# Patient Record
Sex: Female | Born: 1985
Health system: Southern US, Community
[De-identification: ages and names within clinical notes are randomized; demographics above are authoritative.]

## PROBLEM LIST (undated history)

## (undated) DIAGNOSIS — G43909 Migraine, unspecified, not intractable, without status migrainosus: Secondary | ICD-10-CM

## (undated) DIAGNOSIS — Z7189 Other specified counseling: Secondary | ICD-10-CM

## (undated) DIAGNOSIS — O24419 Gestational diabetes mellitus in pregnancy, unspecified control: Secondary | ICD-10-CM

## (undated) DIAGNOSIS — J45909 Unspecified asthma, uncomplicated: Secondary | ICD-10-CM

## (undated) DIAGNOSIS — Z8759 Personal history of other complications of pregnancy, childbirth and the puerperium: Secondary | ICD-10-CM

## (undated) DIAGNOSIS — O9902 Anemia complicating childbirth: Secondary | ICD-10-CM

## (undated) DIAGNOSIS — R51 Headache: Secondary | ICD-10-CM

## (undated) DIAGNOSIS — Z7185 Encounter for immunization safety counseling: Secondary | ICD-10-CM

## (undated) DIAGNOSIS — B009 Herpesviral infection, unspecified: Secondary | ICD-10-CM

## (undated) HISTORY — DX: Herpesviral infection, unspecified: B00.9

## (undated) HISTORY — PX: TYMPANOSTOMY TUBE PLACEMENT: SHX32

## (undated) HISTORY — DX: Migraine, unspecified, not intractable, without status migrainosus: G43.909

## (undated) HISTORY — DX: Other specified counseling: Z71.89

## (undated) HISTORY — DX: Personal history of other complications of pregnancy, childbirth and the puerperium: Z87.59

## (undated) HISTORY — DX: Headache: R51

## (undated) HISTORY — DX: Unspecified asthma, uncomplicated: J45.909

## (undated) HISTORY — DX: Gestational diabetes mellitus in pregnancy, unspecified control: O24.419

## (undated) HISTORY — DX: Anemia complicating childbirth: O99.02

## (undated) HISTORY — PX: MOUTH SURGERY: SHX715

## (undated) HISTORY — PX: WISDOM TOOTH EXTRACTION: SHX21

## (undated) HISTORY — DX: Encounter for immunization safety counseling: Z71.85

## (undated) HISTORY — PX: TYMPANOPLASTY: SHX33

---
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
## 2009-04-26 ENCOUNTER — Ambulatory Visit: Payer: Self-pay | Admitting: Women's Health

## 2009-04-26 ENCOUNTER — Other Ambulatory Visit: Admission: RE | Admit: 2009-04-26 | Discharge: 2009-04-26 | Payer: Self-pay | Admitting: Gynecology

## 2010-04-30 ENCOUNTER — Encounter (INDEPENDENT_AMBULATORY_CARE_PROVIDER_SITE_OTHER): Payer: 59 | Admitting: Women's Health

## 2010-04-30 ENCOUNTER — Other Ambulatory Visit: Payer: Self-pay | Admitting: Women's Health

## 2010-04-30 ENCOUNTER — Other Ambulatory Visit (HOSPITAL_COMMUNITY)
Admission: RE | Admit: 2010-04-30 | Discharge: 2010-04-30 | Disposition: A | Discharge status: Home / Self Care | Payer: 59 | Source: Ambulatory Visit | Attending: Gynecology | Admitting: Gynecology

## 2010-04-30 DIAGNOSIS — N949 Unspecified condition associated with female genital organs and menstrual cycle: Secondary | ICD-10-CM

## 2010-04-30 DIAGNOSIS — Z124 Encounter for screening for malignant neoplasm of cervix: Secondary | ICD-10-CM | POA: Insufficient documentation

## 2010-04-30 DIAGNOSIS — Z113 Encounter for screening for infections with a predominantly sexual mode of transmission: Secondary | ICD-10-CM

## 2010-04-30 DIAGNOSIS — N938 Other specified abnormal uterine and vaginal bleeding: Secondary | ICD-10-CM

## 2010-04-30 DIAGNOSIS — Z01419 Encounter for gynecological examination (general) (routine) without abnormal findings: Secondary | ICD-10-CM

## 2010-07-30 ENCOUNTER — Ambulatory Visit (INDEPENDENT_AMBULATORY_CARE_PROVIDER_SITE_OTHER): Payer: 59 | Admitting: Women's Health

## 2010-07-30 DIAGNOSIS — Z3049 Encounter for surveillance of other contraceptives: Secondary | ICD-10-CM

## 2010-07-30 DIAGNOSIS — Z7689 Persons encountering health services in other specified circumstances: Secondary | ICD-10-CM

## 2010-08-07 ENCOUNTER — Other Ambulatory Visit (INDEPENDENT_AMBULATORY_CARE_PROVIDER_SITE_OTHER): Payer: Self-pay | Admitting: Otolaryngology

## 2010-08-07 DIAGNOSIS — H7190 Unspecified cholesteatoma, unspecified ear: Secondary | ICD-10-CM

## 2010-08-11 ENCOUNTER — Other Ambulatory Visit: Payer: 59

## 2010-10-22 ENCOUNTER — Ambulatory Visit (INDEPENDENT_AMBULATORY_CARE_PROVIDER_SITE_OTHER): Payer: 59 | Admitting: Women's Health

## 2010-10-22 ENCOUNTER — Encounter: Payer: Self-pay | Admitting: Women's Health

## 2010-10-22 VITALS — BP 140/70

## 2010-10-22 DIAGNOSIS — N898 Other specified noninflammatory disorders of vagina: Secondary | ICD-10-CM

## 2010-10-22 DIAGNOSIS — R82998 Other abnormal findings in urine: Secondary | ICD-10-CM

## 2010-10-22 DIAGNOSIS — L293 Anogenital pruritus, unspecified: Secondary | ICD-10-CM

## 2010-10-22 DIAGNOSIS — B3731 Acute candidiasis of vulva and vagina: Secondary | ICD-10-CM

## 2010-10-22 DIAGNOSIS — B373 Candidiasis of vulva and vagina: Secondary | ICD-10-CM

## 2010-10-22 MED ORDER — FLUCONAZOLE 150 MG PO TABS: 150.0000 mg | ORAL_TABLET | Freq: Once | ORAL | Status: AC

## 2010-10-22 MED ORDER — NYSTATIN-TRIAMCINOLONE 100000-0.1 UNIT/GM-% EX OINT: TOPICAL_OINTMENT | Freq: Two times a day (BID) | CUTANEOUS | Status: DC

## 2010-10-22 NOTE — Progress Notes (Signed)
  Presents with a complaint of vaginal irritation with redness, and some urinary frequency. UA today does show 2-3 WBCs, +1 bacteria, will check a urine culture. She just got back from a beach vacation, she has not been sexually active in approximately one year. She's using nuva ring for cycle control with good relief.  External genitalia 6 really erythematous and introitus and extending outward. No blisters were noted. Speculum exam cervix is pink healthy, moderate amount of the white discharge, vaginal walls are also erythematous. Wet prep is positive for yeast will treat with nystatin ointment externally, leave  open to air as best she can, wear loose clothing.  Diflucan 150 by mouth x1 dose, instructed to  call if no relief.

## 2010-11-20 ENCOUNTER — Ambulatory Visit (INDEPENDENT_AMBULATORY_CARE_PROVIDER_SITE_OTHER): Payer: 59

## 2010-11-20 DIAGNOSIS — Z23 Encounter for immunization: Secondary | ICD-10-CM

## 2010-11-21 DIAGNOSIS — Z23 Encounter for immunization: Secondary | ICD-10-CM

## 2011-04-27 ENCOUNTER — Encounter: Payer: Self-pay | Admitting: Gynecology

## 2011-04-27 ENCOUNTER — Ambulatory Visit (INDEPENDENT_AMBULATORY_CARE_PROVIDER_SITE_OTHER): Payer: 59 | Admitting: Gynecology

## 2011-04-27 VITALS — BP 140/92

## 2011-04-27 DIAGNOSIS — L293 Anogenital pruritus, unspecified: Secondary | ICD-10-CM

## 2011-04-27 DIAGNOSIS — N9089 Other specified noninflammatory disorders of vulva and perineum: Secondary | ICD-10-CM

## 2011-04-27 DIAGNOSIS — N898 Other specified noninflammatory disorders of vagina: Secondary | ICD-10-CM

## 2011-04-27 LAB — WET PREP FOR TRICH, YEAST, CLUE: Yeast Wet Prep HPF POC: NONE SEEN

## 2011-04-27 MED ORDER — VALACYCLOVIR HCL 1 G PO TABS: 1000.0000 mg | ORAL_TABLET | Freq: Two times a day (BID) | ORAL | Status: DC

## 2011-04-27 MED ORDER — FLUCONAZOLE 150 MG PO TABS: 150.0000 mg | ORAL_TABLET | Freq: Once | ORAL | Status: AC

## 2011-04-27 MED ORDER — ACYCLOVIR 5 % EX CREA: 1.0000 "application " | TOPICAL_CREAM | CUTANEOUS | Status: DC

## 2011-04-27 NOTE — Progress Notes (Signed)
Patient is a 26 year old that presented to the office today complaining of irritation and rawness of her external genitalia. She stated that 2 weeks ago she was treated for an upper respiratory tract infection as well as otitis media. She was first placed on a Z-Pak and then was placed on Augmentin and shortly thereafter the above-mentioned symptoms began. She states that she has not been sexually active in over year. She denied any dysuria or frequency or discharge per se.  Exam: External genitalia to include clitoral hood, creases between labia majora and minora bilateral an area of the fourchette with vesicular-like lesions highly suspicious for herpes simplex virus. Perineum raw appearance Vagina: Thick white material from Monistat Cervix: No gross lesions Bimanual exam not done due to patient's tenderness and irritation of the external genitalia.  Patient is on during her menstrual control.  GC and Chlamydia culture and wet prep as well as HSV cultures obtained. Wet prep few bacteria but white cream was present making it and complete evaluation.  Assessment/plan: Suspicion for herpes simplex virus first outbreak. Patient states that she has had oral HSV in the past and feels she may have passed upon herself her genitalia. She states she has not been sexually active in over year. Will place mobile checks 1 g by mouth twice a day for 10 days along with acyclovir cream to apply 3-4 times a day for the next 7-10 days. A prescription for Diflucan 150 mg was provided as well. Will notify her with the results later in the week. Literature information on HSV was provided. She will return back to the office in 2 weeks which is due for her annual exam as well.

## 2011-04-30 ENCOUNTER — Encounter: Payer: Self-pay | Admitting: Gynecology

## 2011-05-06 ENCOUNTER — Encounter: Payer: 59 | Admitting: Gynecology

## 2011-05-13 ENCOUNTER — Encounter: Payer: Self-pay | Admitting: Gynecology

## 2011-05-13 ENCOUNTER — Ambulatory Visit (INDEPENDENT_AMBULATORY_CARE_PROVIDER_SITE_OTHER): Payer: 59 | Admitting: Gynecology

## 2011-05-13 VITALS — BP 128/84 | Ht 66.0 in | Wt 179.0 lb

## 2011-05-13 DIAGNOSIS — K5900 Constipation, unspecified: Secondary | ICD-10-CM

## 2011-05-13 DIAGNOSIS — Z01419 Encounter for gynecological examination (general) (routine) without abnormal findings: Secondary | ICD-10-CM

## 2011-05-13 DIAGNOSIS — N9089 Other specified noninflammatory disorders of vulva and perineum: Secondary | ICD-10-CM

## 2011-05-13 DIAGNOSIS — IMO0001 Reserved for inherently not codable concepts without codable children: Secondary | ICD-10-CM

## 2011-05-13 DIAGNOSIS — R634 Abnormal weight loss: Secondary | ICD-10-CM

## 2011-05-13 DIAGNOSIS — Z309 Encounter for contraceptive management, unspecified: Secondary | ICD-10-CM

## 2011-05-13 LAB — WET PREP FOR TRICH, YEAST, CLUE
Clue Cells Wet Prep HPF POC: NONE SEEN
Trich, Wet Prep: NONE SEEN
Yeast Wet Prep HPF POC: NONE SEEN

## 2011-05-13 MED ORDER — ETONOGESTREL-ETHINYL ESTRADIOL 0.12-0.015 MG/24HR VA RING: VAGINAL_RING | VAGINAL | Status: DC

## 2011-05-13 MED ORDER — NYSTATIN-TRIAMCINOLONE 100000-0.1 UNIT/GM-% EX CREA: TOPICAL_CREAM | Freq: Three times a day (TID) | CUTANEOUS | Status: DC

## 2011-05-13 MED ORDER — CLINDAMYCIN PHOSPHATE 2 % VA CREA: 1.0000 | TOPICAL_CREAM | Freq: Every day | VAGINAL | Status: AC

## 2011-05-13 NOTE — Patient Instructions (Signed)
Breast Self-Exam A self breast exam may help you find changes or problems while they are still small. Do a breast self-exam:  Every month.   One week after your period (menstrual period).   On the first day of each month if you do not have periods anymore.  Look for any:  Change in breast color, size, or shape.   Dimples in your breast.   Changes in your nipples or skin.   Dry skin on your breasts or nipples.   Watery or bloody discharge from your nipples.   Feel for:  Lumps.   Thick, hard places.   Any other changes.  HOME CARE There are 3 ways to do the breast self-exam: In front of a mirror.  Lift your arms over your head and turn side to side.   Put your hands on your hips and lean down, then turn from side to side.   Bend forward and turn from side to side.  In the shower.  With soapy hands, check both breasts. Then check above and below your collarbone and your armpits.   Feel above and below your collarbone down to under your breast, and from the center of your chest to the outer edge of the armpit. Check for any lumps or hard spots.   Using the tips of your middle three fingers check your whole breast by pressing your hand over your breast in a circle or in an up and down motion.  Lying down.  Lie flat on your bed.   Put a small pillow under the breast you are going to check. On that same side, put your hand behind your head.   With your other hand, use the 3 middle fingers to feel the breast.   Move your fingers in a circle around the breast. Press firmly over all parts of the breast to feel for any lumps.  GET HELP RIGHT AWAY IF: You find any changes in your breasts so they can be checked. Document Released: 08/05/2007 Document Revised: 02/05/2011 Document Reviewed: 06/06/2008 [facility] Patient Information 2012 [place], [place].  Constipation in Adults Constipation is having fewer than 2 bowel movements per week. Usually, the stools are hard. As we  grow older, constipation is more common. If you try to fix constipation with laxatives, the problem may get worse. This is because laxatives taken over a long period of time make the colon muscles weaker. A low-fiber diet, not taking in enough fluids, and taking some medicines may make these problems worse. MEDICATIONS THAT MAY CAUSE CONSTIPATION  Water pills (diuretics).   Calcium channel blockers (used to control blood pressure and for the heart).   Certain pain medicines (narcotics).   Anticholinergics.   Anti-inflammatory agents.   Antacids that contain aluminum.  DISEASES THAT CONTRIBUTE TO CONSTIPATION  Diabetes.   Parkinson's disease.   Dementia.   Stroke.   Depression.   Illnesses that cause problems with salt and water metabolism.  HOME CARE INSTRUCTIONS   Constipation is usually best cared for without medicines. Increasing dietary fiber and eating more fruits and vegetables is the best way to manage constipation.   Slowly increase fiber intake to 25 to 38 grams per day. Whole grains, fruits, vegetables, and legumes are good sources of fiber. A dietitian can further help you incorporate high-fiber foods into your diet.   Drink enough water and fluids to keep your urine clear or pale yellow.   A fiber supplement may be added to your diet if you cannot  get enough fiber from foods.   Increasing your activities also helps improve regularity.   Suppositories, as suggested by your caregiver, will also help. If you are using antacids, such as aluminum or calcium containing products, it will be helpful to switch to products containing magnesium if your caregiver says it is okay.   If you have been given a liquid injection (enema) today, this is only a temporary measure. It should not be relied on for treatment of longstanding (chronic) constipation.   Stronger measures, such as magnesium sulfate, should be avoided if possible. This may cause uncontrollable diarrhea. Using  magnesium sulfate may not allow you time to make it to the bathroom.  SEEK IMMEDIATE MEDICAL CARE IF:   There is bright red blood in the stool.   The constipation stays for more than 4 days.   There is belly (abdominal) or rectal pain.   You do not seem to be getting better.   You have any questions or concerns.  MAKE SURE YOU:   Understand these instructions.   Will watch your condition.   Will get help right away if you are not doing well or get worse.  Document Released: 11/15/2003 Document Revised: 02/05/2011 Document Reviewed: 01/20/2011 [facility] Patient Information 2012 [place], [place].

## 2011-05-13 NOTE — Progress Notes (Addendum)
Laurie Reese 12-29-1985 [id number]   History:    26 y.o.  for annual exam who was recently treated for HSV confirmed by culture. She states that she has not been sexually active for over 2 years. She was recently and a lot of stress and suffered constipation. She's currently using NuvaRing for contraception. Her last Pap smear was in 2012. She frequently does her self breast examination. Review of her record indicates she was weighing 202 pounds and is down to 179. Patient has completed the Gardasil vaccine series  Past medical history,surgical history, family history and social history were all reviewed and documented in the EPIC chart.  Gynecologic History Patient's last menstrual period was 05/01/2011. Contraception: NuvaRing Last Pap: 2012. Results were: normal Last mammogram: Not indicated. Results were: Not indicated  Obstetric History OB History    Grav Para Term Preterm Abortions TAB SAB Ect Mult Living                   ROS:  Was performed and pertinent positives and negatives are included in the history.  Exam: chaperone present  BP 128/84  Ht 5\' 6"  (1.676 m)  Wt 179 lb (81.194 kg)  BMI 28.89 kg/m2  LMP 05/01/2011  Body mass index is 28.89 kg/(m^2).  General appearance : Well developed well nourished female. No acute distress HEENT: Neck supple, trachea midline, no carotid bruits, no thyroidmegaly Lungs: Clear to auscultation, no rhonchi or wheezes, or rib retractions  Heart: Regular rate and rhythm, no murmurs or gallops Breast:Examined in sitting and supine position were symmetrical in appearance, no palpable masses or tenderness,  no skin retraction, no nipple inversion, no nipple discharge, no skin discoloration, no axillary or supraclavicular lymphadenopathy Abdomen: no palpable masses or tenderness, no rebound or guarding Extremities: no edema or skin discoloration or tenderness  Pelvic:  Bartholin, Urethra, Skene Glands: Within normal limits  Vagina: No gross lesions or discharge  Cervix: No gross lesions or discharge  Uterus  anteverted, normal size, shape and consistency, non-tender and mobile  Adnexa  Without masses or tenderness  Anus and perineum  normal   Rectovaginal  normal sphincter tone without palpated masses or tenderness             Hemoccult not done   Wet prep tumors to count bacteria  Assessment/Plan:  26 y.o. female for annual exam with apparent BV. She will be placed on Cleocin vaginal cream to apply each bedtime for 5 days. Patient appears to have a slight tear at the area of the fourchette. She should do sitz baths and she may apply the Cleocin vaginal cream externally as well. New Pap smear screening guidelines discussed next Pap smear will be due in 2 years.. She was encouraged to do her monthly self breast examination. The following labs were drawn today: CBC, hemoglobin A1c, TSH, and urinalysis.   For constipation she will be placed on MiraLax to take 1 tablespoon daily with juice her water. She was instructed to increase her fiber intake as well as fluid intake.   Ok Edwards MD, 5:45 PM 05/13/2011

## 2011-05-14 LAB — URINALYSIS W MICROSCOPIC + REFLEX CULTURE
Casts: NONE SEEN
Glucose, UA: NEGATIVE mg/dL
Leukocytes, UA: NEGATIVE
Specific Gravity, Urine: 1.022 (ref 1.005–1.030)
Squamous Epithelial / LPF: NONE SEEN
pH: 5.5 (ref 5.0–8.0)

## 2011-05-14 LAB — CBC WITH DIFFERENTIAL/PLATELET
HCT: 40.4 % (ref 36.0–46.0)
Hemoglobin: 13.2 g/dL (ref 12.0–15.0)
Lymphocytes Relative: 29 % (ref 12–46)
MCHC: 32.7 g/dL (ref 30.0–36.0)
Monocytes Absolute: 0.7 10*3/uL (ref 0.1–1.0)
Monocytes Relative: 8 % (ref 3–12)
Neutro Abs: 5.9 10*3/uL (ref 1.7–7.7)
WBC: 9.5 10*3/uL (ref 4.0–10.5)

## 2011-05-14 LAB — HEMOGLOBIN A1C: Mean Plasma Glucose: 111 mg/dL (ref <117)

## 2011-07-11 ENCOUNTER — Emergency Department (HOSPITAL_COMMUNITY)
Admission: EM | Admit: 2011-07-11 | Discharge: 2011-07-11 | Disposition: A | Discharge status: Home / Self Care | Payer: 59 | Source: Home / Self Care | Attending: Emergency Medicine | Admitting: Emergency Medicine

## 2011-07-11 ENCOUNTER — Encounter (HOSPITAL_COMMUNITY): Payer: Self-pay

## 2011-07-11 DIAGNOSIS — L299 Pruritus, unspecified: Secondary | ICD-10-CM

## 2011-07-11 MED ORDER — HYDROXYZINE HCL 25 MG PO TABS: 25.0000 mg | ORAL_TABLET | Freq: Four times a day (QID) | ORAL | Status: AC

## 2011-07-11 NOTE — Discharge Instructions (Signed)
Itching  Itching is a symptom that can be caused by many things. These include skin problems (including infections) as well as some internal diseases.   If the itching is affecting just one area of the body, it is most likely due to a common skin problem, such as:   Poison oak and poison ivy.   Contact dermatitis (skin irritation from a plant, chemicals, fiberglass, detergents, new cosmetic, new jewelry, or other substance).   Fungus (such as athlete's foot, jock itch, or ringworm).   Head lice   Dandruff   Insect bite   Infection (such as Shingles or other virus infections).  If the itching is all over (widespread), the possible causes are many. These include:    Dry skin or eczema   Heat rash   Hives   Liver disorders   Kidney disorders  TREATMENT   Localized itching    Lubrication of the skin. Use an ointment or cream or other unperfumed moisturizers if the skin is dry. Apply frequently, especially after bathing.   Anti-itch medicines. These medications may help control the urge to scratch. Scratching always makes itching worse and increases the chance of getting an infection.   Cortisone creams and ointments. These help reduce the inflammation.   Antibiotics. Skin infections can cause itching. Topical or oral antibiotics may be needed for 10 to 20 days to get rid of an infection.  If you can identify what caused the itching, avoid this substance in the future.   Widespread itching   The following measures may help to relieve itching regardless of the cause:    Wash the skin once with soap to remove irritants.   Bathe in tepid water with baking soda, cornstarch, or oatmeal.   Use calamine lotion (nonprescription) or a baking soda solution (1 teaspoon in 4 ounces of water on the skin).   Apply 1% hydrocortisone cream (no prescription needed). Do not use this if there might be a skin infection.   Avoid scratching.   Avoid itchy or tight-fitting clothes.   Avoid excessive heat, sweating,  scented soaps, and swimming pools.   The lubricants, anti-itch medicines, etc. noted above may be helpful for controlling symptoms.  SEEK MEDICAL CARE IF:    The itching becomes severe.   Your itch is not better after 1 week of treatment. Contact your caregiver to schedule further evaluation.  Document Released: 02/16/2005 Document Revised: 02/05/2011 Document Reviewed: 08/06/2006  [employer] Patient Information 2012 [employer].

## 2011-07-11 NOTE — ED Provider Notes (Addendum)
History     CSN: [id number]  Arrival date & time 07/11/11  0945   First MD Initiated Contact with Patient 07/11/11 [phone]      Chief Complaint  Patient presents with  . Rash    (Consider location/radiation/quality/duration/timing/severity/associated sxs/prior treatment) HPI Comments: This is present she thinks she is having an  allergic reaction to Celexa that she was started about a week ago. She has been itchy on her back face and arms. He has his rash. Is no other family member or is constant with a similar rash.  Patient denies any other symptoms such as shortness of breath, facial swelling, shortness of breath,  Patient is a 26 y.o. female presenting with rash. The history is provided by the patient.  Rash  This is a recurrent problem. The current episode started more than 2 days ago. The problem has not changed since onset.The rash is present on the back, trunk, right arm and face.    Past Medical History  Diagnosis Date  . Headache   . HPV vaccine counseling     Gardasil series completed ...   . HSV-1 infection     Past Surgical History  Procedure Date  . Wisdom tooth extraction   . Tympanostomy tube placement   . Mouth surgery     Family History  Problem Relation Age of Onset  . Hypertension Mother   . Heart disease Maternal Grandmother   . Diabetes Maternal Grandfather   . Hypertension Maternal Grandfather   . Heart disease Maternal Grandfather     History  Substance Use Topics  . Smoking status: Never Smoker   . Smokeless tobacco: Never Used  . Alcohol Use: No    OB History    Grav Para Term Preterm Abortions TAB SAB Ect Mult Living                  Review of Systems  Constitutional: Negative for fever, activity change and appetite change.  Skin: Positive for rash.    Allergies  Review of patient's allergies indicates no known allergies.  Home Medications   Current Outpatient Rx  Name Route Sig Dispense Refill  . CITALOPRAM HYDROBROMIDE  10 MG PO TABS Oral Take 10 mg by mouth daily.    . ACYCLOVIR 5 % EX CREA Topical Apply 1 application topically every 3 (three) hours. 15 g 1  . ETONOGESTREL-ETHINYL ESTRADIOL 0.12-0.015 MG/24HR VA RING  Apply q monthly as directed 1 each 11  . HYDROXYZINE HCL 25 MG PO TABS Oral Take 1 tablet (25 mg total) by mouth every 6 (six) hours. 20 tablet 0  . MULTIVITAMIN PO Oral Take by mouth.        BP 136/94  Pulse 79  Temp(Src) 98.2 F (36.8 C) (Oral)  Resp 17  SpO2 100%  LMP 07/01/2011  Physical Exam  Nursing note and vitals reviewed. Constitutional: She appears well-developed and well-nourished.  HENT:  Head: Normocephalic.  Eyes: Conjunctivae are normal. Right eye exhibits no discharge. Left eye exhibits no discharge.  Skin: Skin is warm. Abrasion and rash noted. No lesion noted. There is erythema.       ED Course  Procedures (including critical care time)  Labs Reviewed - No data to display No results found.   1. Pruritus - disorder       MDM  Patient has multiple superficial scratching lesions, was barely able to recognize any intact area to the note any urticaria or hives. Suspect most of this localize superficial  epidermal disruptions or secondary to scratching. There only located on her face and arms were areas that are reachable by her.        Jimmie Molly, MD 07/11/11 1811  Jimmie Molly, MD 07/11/11 [phone]

## 2011-07-11 NOTE — ED Notes (Signed)
Pt started celexa one week ago and now has itchy rash on back, arms and face.

## 2011-07-21 ENCOUNTER — Other Ambulatory Visit: Payer: Self-pay | Admitting: Women's Health

## 2011-08-06 ENCOUNTER — Encounter: Payer: Self-pay | Admitting: Internal Medicine

## 2011-08-26 ENCOUNTER — Encounter: Payer: Self-pay | Admitting: Internal Medicine

## 2011-08-26 ENCOUNTER — Ambulatory Visit (INDEPENDENT_AMBULATORY_CARE_PROVIDER_SITE_OTHER): Payer: 59 | Admitting: Internal Medicine

## 2011-08-26 VITALS — BP 122/74 | HR 84 | Ht 66.5 in | Wt 179.2 lb

## 2011-08-26 DIAGNOSIS — K602 Anal fissure, unspecified: Secondary | ICD-10-CM

## 2011-08-26 DIAGNOSIS — K5900 Constipation, unspecified: Secondary | ICD-10-CM

## 2011-08-26 MED ORDER — AMBULATORY NON FORMULARY MEDICATION: Status: DC

## 2011-08-26 NOTE — Progress Notes (Signed)
HISTORY OF PRESENT ILLNESS:  Laurie Reese is a 26 y.o. female who presents today regarding rectal bleeding. Patient states she was in her usual state of health until about 2 months ago when she developed severe constipation. This required manual disimpaction. Thereafter, she has noticed rectal discomfort and bright red blood on the tissue with most every bowel movement. She tends to move her bowels every 2-3 days. This is typical for her. Occasional problems with severe constipation as described. No dominant pain. GI review of systems is otherwise negative. She saw her primary provider, Dr. Selena Batten, recently. I have reviewed the office encounter including rectal exam which shows a small hemorrhoid. As well, outside blood work reviewed. Normal comprehensive metabolic panel. No other issues were complaints  REVIEW OF SYSTEMS:  All non-GI ROS negative except for headaches  Past Medical History  Diagnosis Date  . Headache   . HPV vaccine counseling     Gardasil series completed ...   . HSV-1 infection     Past Surgical History  Procedure Date  . Wisdom tooth extraction   . Tympanostomy tube placement   . Mouth surgery     Social History Laurie Reese  reports that she has never smoked. She has never used smokeless tobacco. She reports that she does not drink alcohol or use illicit drugs.  family history includes Diabetes in her maternal grandfather; Heart disease in her maternal grandfather and maternal grandmother; Hypertension in her maternal grandfather and mother; and Prostate cancer in her maternal grandfather.  There is no history of Colon cancer.  No Known Allergies     PHYSICAL EXAMINATION: Vital signs: BP 122/74  Pulse 84  Ht 5' 6.5" (1.689 m)  Wt 179 lb 3.2 oz (81.285 kg)  BMI 28.49 kg/m2  LMP 08/26/2011  Constitutional: generally well-appearing, no acute distress Psychiatric: alert and oriented x3, cooperative Eyes: extraocular movements intact, anicteric, conjunctiva  pink Mouth: oral pharynx moist, no lesions Neck: supple no lymphadenopathy Cardiovascular: heart regular rate and rhythm, no murmur Lungs: clear to auscultation bilaterally Abdomen: soft, nontender, nondistended, no obvious ascites, no peritoneal signs, normal bowel sounds, no organomegaly Rectal:small external hemorrhoid tag. Posterior fissure which is slightly tender. Hemoccult negative stool Extremities: no lower extremity edema bilaterally Skin: no lesions on visible extremities Neuro: No focal deficits.   ASSESSMENT:  #1. Anal fissure. This as an explanation for bleeding and rectal discomfort #2. Constipation    PLAN:  #1. Daily fiber supplementation #2. Increase water consumption  #3. Stool softener with Colace 100-200 mg daily #4. Sitz baths twice a day #5. Diltiazem cream, 5%, to be applied to anal sphincter by times daily #6. Literature provided on anal fissure #7. GI followup as needed

## 2011-08-26 NOTE — Patient Instructions (Addendum)
We have sent the following medications to your pharmacy for you to pick up at your convenience: diltiazem  Please increase your daily intake of water.    Use sitz baths 1 to 2 times a day  Please purchase colace 100mg  over the counter.  Take 1 to 2 capsules daily.    Please purchase fiber tablets over the counter and take daily as per instructions.

## 2011-12-29 ENCOUNTER — Encounter: Payer: Self-pay | Admitting: Gynecology

## 2011-12-29 ENCOUNTER — Ambulatory Visit (INDEPENDENT_AMBULATORY_CARE_PROVIDER_SITE_OTHER): Payer: 59 | Admitting: Gynecology

## 2011-12-29 VITALS — BP 134/88

## 2011-12-29 DIAGNOSIS — L739 Follicular disorder, unspecified: Secondary | ICD-10-CM

## 2011-12-29 DIAGNOSIS — L738 Other specified follicular disorders: Secondary | ICD-10-CM

## 2011-12-29 DIAGNOSIS — L678 Other hair color and hair shaft abnormalities: Secondary | ICD-10-CM

## 2011-12-29 NOTE — Patient Instructions (Signed)
Folliculitis   Folliculitis is redness, soreness, and swelling (inflammation) of the hair follicles. This condition can occur anywhere on the body. People with weakened immune systems, diabetes, or obesity have a greater risk of getting folliculitis.  CAUSES   Bacterial infection. This is the most common cause.   Fungal infection.   Viral infection.   Contact with certain chemicals, especially oils and tars.  Long-term folliculitis can result from bacteria that live in the nostrils. The bacteria may trigger multiple outbreaks of folliculitis over time.  SYMPTOMS  Folliculitis most commonly occurs on the scalp, thighs, legs, back, buttocks, and areas where hair is shaved frequently. An early sign of folliculitis is a small, white or yellow, pus-filled, itchy lesion (pustule). These lesions appear on a red, inflamed follicle. They are usually less than 0.2 inches (5 mm) wide. When there is an infection of the follicle that goes deeper, it becomes a boil or furuncle. A group of closely packed boils creates a larger lesion (carbuncle). Carbuncles tend to occur in hairy, sweaty areas of the body.  DIAGNOSIS   Your caregiver can usually tell what is wrong by doing a physical exam. A sample may be taken from one of the lesions and tested in a lab. This can help determine what is causing your folliculitis.  TREATMENT   Treatment may include:   Applying warm compresses to the affected areas.   Taking antibiotic medicines orally or applying them to the skin.   Draining the lesions if they contain a large amount of pus or fluid.   Laser hair removal for cases of long-lasting folliculitis. This helps to prevent regrowth of the hair.  HOME CARE INSTRUCTIONS   Apply warm compresses to the affected areas as directed by your caregiver.   If antibiotics are prescribed, take them as directed. Finish them even if you start to feel better.   You may take over-the-counter medicines to relieve itching.   Do not shave  irritated skin.   Follow up with your caregiver as directed.  SEEK IMMEDIATE MEDICAL CARE IF:    You have increasing redness, swelling, or pain in the affected area.   You have a fever.  MAKE SURE YOU:   Understand these instructions.   Will watch your condition.   Will get help right away if you are not doing well or get worse.  Document Released: 04/27/2001 Document Revised: 08/18/2011 Document Reviewed: 05/19/2011  [employer] Patient Information 2013 [employer].

## 2011-12-29 NOTE — Progress Notes (Signed)
Patient is a 26 year old who presented to the office today concerned about this pimple size lesion that she had noted on the right labia majora. Patient within the past year had been diagnosed with HSV and was quite anxious today.  Exam: Bartholin urethra Skene was within normal limits Inferior portion of right labia majora a small whitehead suspicious for a small little sebaceous gland/cyst was noted. There was no other external genital, perineal, or perirectal lesions. Vagina: Speculum exam NuvaRing present no lesion seen  Assessment/plan: Patient reassured normal findings she should not attempt to squeeze or pop it. She can do sitz baths and eventually will clear by itself it is very very small and was visualized with a magnifying lens.

## 2012-06-09 ENCOUNTER — Ambulatory Visit (INDEPENDENT_AMBULATORY_CARE_PROVIDER_SITE_OTHER): Payer: 59 | Admitting: Gynecology

## 2012-06-09 ENCOUNTER — Encounter: Payer: Self-pay | Admitting: Gynecology

## 2012-06-09 VITALS — BP 140/84 | Ht 66.5 in | Wt 189.0 lb

## 2012-06-09 DIAGNOSIS — L708 Other acne: Secondary | ICD-10-CM

## 2012-06-09 DIAGNOSIS — N921 Excessive and frequent menstruation with irregular cycle: Secondary | ICD-10-CM

## 2012-06-09 DIAGNOSIS — L700 Acne vulgaris: Secondary | ICD-10-CM | POA: Insufficient documentation

## 2012-06-09 DIAGNOSIS — Z01419 Encounter for gynecological examination (general) (routine) without abnormal findings: Secondary | ICD-10-CM

## 2012-06-09 MED ORDER — CLINDAMYCIN PHOSPHATE 1 % EX GEL: Freq: Two times a day (BID) | CUTANEOUS | Status: DC

## 2012-06-09 MED ORDER — ETONOGESTREL-ETHINYL ESTRADIOL 0.12-0.015 MG/24HR VA RING: VAGINAL_RING | VAGINAL | Status: DC

## 2012-06-09 NOTE — Patient Instructions (Signed)
ABreast Self-Awareness Practicing breast self-awareness may pick up problems early, prevent significant medical complications, and possibly save your life. By practicing breast self-awareness, you can become familiar with how your breasts look and feel and if your breasts are changing. This allows you to notice changes early. It can also offer you some reassurance that your breast health is good. One way to learn what is normal for your breasts and whether your breasts are changing is to do a breast self-exam. If you find a lump or something that was not present in the past, it is best to contact your caregiver right away. Other findings that should be evaluated by your caregiver include nipple discharge, especially if it is bloody; skin changes or reddening; areas where the skin seems to be pulled in (retracted); or new lumps and bumps. Breast pain is seldom associated with cancer (malignancy), but should also be evaluated by a caregiver. BREAST SELF-EXAM The best time to examine your breasts is 5 7 days after your menstrual period is over. During menstruation, the breasts are lumpier, and it may be more difficult to pick up changes. If you do not menstruate, have reached menopause, or had your uterus removed (hysterectomy), you should examine your breasts at regular intervals, such as monthly. If you are breastfeeding, examine your breasts after a feeding or after using a breast pump. Breast implants do not decrease the risk for lumps or tumors, so continue to perform breast self-exams as recommended. Talk to your caregiver about how to determine the difference between the implant and breast tissue. Also, talk about the amount of pressure you should use during the exam. Over time, you will become more familiar with the variations of your breasts and more comfortable with the exam. A breast self-exam requires you to remove all your clothes above the waist.   Look at your breasts and nipples. Stand in front of  a mirror in a room with good lighting. With your hands on your hips, push your hands firmly downward. Look for a difference in shape, contour, and size from one breast to the other (asymmetry). Asymmetry includes puckers, dips, or bumps. Also, look for skin changes, such as reddened or scaly areas on the breasts. Look for nipple changes, such as discharge, dimpling, repositioning, or redness.  Carefully feel your breasts. This is best done either in the shower or tub while using soapy water or when flat on your back. Place the arm (on the side of the breast you are examining) above your head. Use the pads (not the fingertips) of your three middle fingers on your opposite hand to feel your breasts. Start in the underarm area and use  inch (2 cm) overlapping circles to feel your breast. Use 3 different levels of pressure (light, medium, and firm pressure) at each circle before moving to the next circle. The light pressure is needed to feel the tissue closest to the skin. The medium pressure will help to feel breast tissue a little deeper, while the firm pressure is needed to feel the tissue close to the ribs. Continue the overlapping circles, moving downward over the breast until you feel your ribs below your breast. Then, move one finger-width towards the center of the body. Continue to use the  inch (2 cm) overlapping circles to feel your breast as you move slowly up toward the collar bone (clavicle) near the base of the neck. Continue the up and down exam using all 3 pressures until you reach the middle of  until you reach the middle of the chest. Do this with each breast, carefully feeling for lumps or changes.   Keep a written record with breast changes or normal findings for each breast. By writing this information down, you do not need to depend only on memory for size, tenderness, or location. Write down where you are in your menstrual cycle, if you are still menstruating.   Breast tissue can have some lumps or thick tissue. However,  see your caregiver if you find anything that concerns you.   SEEK MEDICAL CARE IF:   You see a change in shape, contour, or size of your breasts or nipples.    You see skin changes, such as reddened or scaly areas on the breasts or nipples.    You have an unusual discharge from your nipples.    You feel a new lump or unusually thick areas.   Document Released: 02/16/2005 Document Revised: 08/18/2011 Document Reviewed: 06/03/2011  Exit[employer] Patient Information 2013 [employer].

## 2012-06-09 NOTE — Progress Notes (Signed)
Laurie Reese September 28, 1985 [id number]   History:    27 y.o.  for annual gyn exam complaining of vulvar irritation and some areas of her breast as well. Patient has been exercising and eating healthier she was weighing 202 and was weighing 189. She's using the NuvaRing for contraception and at times she had some spotting. Patient's last Pap smear normal 2012. Patient does her monthly self breast examination.  Past medical history,surgical history, family history and social history were all reviewed and documented in the EPIC chart.  Gynecologic History Patient's last menstrual period was 05/23/2012. Contraception: NuvaRing vaginal inserts Last Pap: 2012. Results were: normal Last mammogram: that indicated. Results were: normal  Obstetric History OB History   Grav Para Term Preterm Abortions TAB SAB Ect Mult Living   0                ROS: A ROS was performed and pertinent positives and negatives are included in the history.  GENERAL: No fevers or chills. HEENT: No change in vision, no earache, sore throat or sinus congestion. NECK: No pain or stiffness. CARDIOVASCULAR: No chest pain or pressure. No palpitations. PULMONARY: No shortness of breath, cough or wheeze. GASTROINTESTINAL: No abdominal pain, nausea, vomiting or diarrhea, melena or bright red blood per rectum. GENITOURINARY: No urinary frequency, urgency, hesitancy or dysuria. MUSCULOSKELETAL: No joint or muscle pain, no back pain, no recent trauma. DERMATOLOGIC: No rash, no itching, no lesions. ENDOCRINE: No polyuria, polydipsia, no heat or cold intolerance. No recent change in weight. HEMATOLOGICAL: No anemia or easy bruising or bleeding. NEUROLOGIC: No headache, seizures, numbness, tingling or weakness. PSYCHIATRIC: No depression, no loss of interest in normal activity or change in sleep pattern.    Breasts irritation and vulvar irritation  Exam: chaperone present  BP 140/84  Ht 5' 6.5" (1.689 m)  Wt 189 lb (85.73 kg)  BMI 30.05  kg/m2  LMP 05/23/2012  Body mass index is 30.05 kg/(m^2).  General appearance : Well developed well nourished female. No acute distress HEENT: Neck supple, trachea midline, no carotid bruits, no thyroidmegaly Lungs: Clear to auscultation, no rhonchi or wheezes, or rib retractions  Heart: Regular rate and rhythm, no murmurs or gallops Breast:Examined in sitting and supine position were symmetrical in appearance, no palpable masses or tenderness,  no skin retraction, no nipple inversion, no nipple discharge,  no axillary or supraclavicular lymphadenopathy, acne vulgaris both breasts Abdomen: no palpable masses or tenderness, no rebound or guarding Extremities: no edema or skin discoloration or tenderness  Pelvic:  Bartholin, Urethra, Skene Glands: Within normal limits             Vagina: No gross lesions or discharge  Cervix: No gross lesions or discharge  Uterus  anteverted, normal size, shape and consistency, non-tender and mobile  Adnexa  Without masses or tenderness  Anus and perineum  normal   Rectovaginal  normal sphincter tone without palpated masses or tenderness             Hemoccult not indicated     Assessment/Plan:  27 y.o. female for annual exam with evidence  of acne vulgaris -like areas on both breasts. She will be started on Clindagel 1% to apply twice a day for 7-10 days and then once a week when necessary. If this continues she was instructed to followup with her dermatologist. Prescription was provided for the NuvaRing. We discussed alternative form of contraception such as a Mirena IUD for which literature information was provided. She was instructed to  continue to do her monthly self breast examination. No Pap smear until next year according to the guidelines. The following labs were ordered: CBC, TSH, hemoglobin A1c, urinalysis and screening cholesterol.    Ok Edwards MD, 2:53 PM 06/09/2012

## 2012-06-10 LAB — URINALYSIS W MICROSCOPIC + REFLEX CULTURE
Crystals: NONE SEEN
Glucose, UA: NEGATIVE mg/dL
Leukocytes, UA: NEGATIVE
Protein, ur: NEGATIVE mg/dL
Specific Gravity, Urine: 1.029 (ref 1.005–1.030)
Squamous Epithelial / LPF: NONE SEEN
Urobilinogen, UA: 1 mg/dL (ref 0.0–1.0)

## 2012-06-10 LAB — CBC WITH DIFFERENTIAL/PLATELET
Basophils Absolute: 0 10*3/uL (ref 0.0–0.1)
Basophils Relative: 1 % (ref 0–1)
Eosinophils Absolute: 0 10*3/uL (ref 0.0–0.7)
MCH: 28.6 pg (ref 26.0–34.0)
MCHC: 33.7 g/dL (ref 30.0–36.0)
Monocytes Relative: 12 % (ref 3–12)
Neutro Abs: 3.4 10*3/uL (ref 1.7–7.7)
Neutrophils Relative %: 54 % (ref 43–77)
Platelets: 265 10*3/uL (ref 150–400)
RDW: 13.6 % (ref 11.5–15.5)

## 2012-06-10 LAB — CHOLESTEROL, TOTAL: Cholesterol: 156 mg/dL (ref 0–200)

## 2012-06-10 LAB — TSH: TSH: 1.056 u[IU]/mL (ref 0.350–4.500)

## 2012-06-14 ENCOUNTER — Telehealth: Payer: Self-pay | Admitting: Gynecology

## 2012-06-14 NOTE — Telephone Encounter (Signed)
I LM on pt cell phone(ok DPR) that her [employer] ins covers the Mirena & insertion without a copay at 100%. She will call back if she wants to proceed. WL

## 2012-07-21 ENCOUNTER — Encounter: Payer: Self-pay | Admitting: Gynecology

## 2013-01-05 ENCOUNTER — Other Ambulatory Visit: Payer: Self-pay

## 2013-04-03 ENCOUNTER — Encounter: Payer: Self-pay | Admitting: Gynecology

## 2013-04-03 ENCOUNTER — Ambulatory Visit (INDEPENDENT_AMBULATORY_CARE_PROVIDER_SITE_OTHER): Payer: 59 | Admitting: Gynecology

## 2013-04-03 VITALS — BP 122/78

## 2013-04-03 DIAGNOSIS — N9489 Other specified conditions associated with female genital organs and menstrual cycle: Secondary | ICD-10-CM

## 2013-04-03 DIAGNOSIS — L259 Unspecified contact dermatitis, unspecified cause: Secondary | ICD-10-CM

## 2013-04-03 DIAGNOSIS — B373 Candidiasis of vulva and vagina: Secondary | ICD-10-CM

## 2013-04-03 DIAGNOSIS — B3731 Acute candidiasis of vulva and vagina: Secondary | ICD-10-CM

## 2013-04-03 DIAGNOSIS — N949 Unspecified condition associated with female genital organs and menstrual cycle: Secondary | ICD-10-CM

## 2013-04-03 DIAGNOSIS — Z113 Encounter for screening for infections with a predominantly sexual mode of transmission: Secondary | ICD-10-CM

## 2013-04-03 LAB — WET PREP FOR TRICH, YEAST, CLUE
Clue Cells Wet Prep HPF POC: NONE SEEN
Trich, Wet Prep: NONE SEEN

## 2013-04-03 MED ORDER — FLUCONAZOLE 150 MG PO TABS: 150.0000 mg | ORAL_TABLET | Freq: Once | ORAL | Status: DC

## 2013-04-03 MED ORDER — NYSTATIN-TRIAMCINOLONE 100000-0.1 UNIT/GM-% EX OINT: 1.0000 "application " | TOPICAL_OINTMENT | Freq: Two times a day (BID) | CUTANEOUS | Status: DC

## 2013-04-03 NOTE — Patient Instructions (Signed)
Contact Dermatitis Contact dermatitis is a reaction to certain substances that touch the skin. Contact dermatitis can be either irritant contact dermatitis or allergic contact dermatitis. Irritant contact dermatitis does not require previous exposure to the substance for a reaction to occur.Allergic contact dermatitis only occurs if you have been exposed to the substance before. Upon a repeat exposure, your body reacts to the substance.  CAUSES  Many substances can cause contact dermatitis. Irritant dermatitis is most commonly caused by repeated exposure to mildly irritating substances, such as:  Makeup.  Soaps.  Detergents.  Bleaches.  Acids.  Metal salts, such as nickel. Allergic contact dermatitis is most commonly caused by exposure to:  Poisonous plants.  Chemicals (deodorants, shampoos).  Jewelry.  Latex.  Neomycin in triple antibiotic cream.  Preservatives in products, including clothing. SYMPTOMS  The area of skin that is exposed may develop:  Dryness or flaking.  Redness.  Cracks.  Itching.  Pain or a burning sensation.  Blisters. With allergic contact dermatitis, there may also be swelling in areas such as the eyelids, mouth, or genitals.  DIAGNOSIS  Your caregiver can usually tell what the problem is by doing a physical exam. In cases where the cause is uncertain and an allergic contact dermatitis is suspected, a patch skin test may be performed to help determine the cause of your dermatitis. TREATMENT Treatment includes protecting the skin from further contact with the irritating substance by avoiding that substance if possible. Barrier creams, powders, and gloves may be helpful. Your caregiver may also recommend:  Steroid creams or ointments applied 2 times daily. For best results, soak the rash area in cool water for 20 minutes. Then apply the medicine. Cover the area with a plastic wrap. You can store the steroid cream in the refrigerator for a "chilly"  effect on your rash. That may decrease itching. Oral steroid medicines may be needed in more severe cases.  Antibiotics or antibacterial ointments if a skin infection is present.  Antihistamine lotion or an antihistamine taken by mouth to ease itching.  Lubricants to keep moisture in your skin.  Burow's solution to reduce redness and soreness or to dry a weeping rash. Mix one packet or tablet of solution in 2 cups cool water. Dip a clean washcloth in the mixture, wring it out a bit, and put it on the affected area. Leave the cloth in place for 30 minutes. Do this as often as possible throughout the day.  Taking several cornstarch or baking soda baths daily if the area is too large to cover with a washcloth. Harsh chemicals, such as alkalis or acids, can cause skin damage that is like a burn. You should flush your skin for 15 to 20 minutes with cold water after such an exposure. You should also seek immediate medical care after exposure. Bandages (dressings), antibiotics, and pain medicine may be needed for severely irritated skin.  HOME CARE INSTRUCTIONS  Avoid the substance that caused your reaction.  Keep the area of skin that is affected away from hot water, soap, sunlight, chemicals, acidic substances, or anything else that would irritate your skin.  Do not scratch the rash. Scratching may cause the rash to become infected.  You may take cool baths to help stop the itching.  Only take over-the-counter or prescription medicines as directed by your caregiver.  See your caregiver for follow-up care as directed to make sure your skin is healing properly. SEEK MEDICAL CARE IF:   Your condition is not better after 3   days of treatment.  You seem to be getting worse.  You see signs of infection such as swelling, tenderness, redness, soreness, or warmth in the affected area.  You have any problems related to your medicines. Document Released: 02/14/2000 Document Revised: 05/11/2011  Document Reviewed: 07/22/2010 [facility]Exit[facility] Patient Information 2014 [place], [place]. Monilial Vaginitis Vaginitis in a soreness, swelling and redness (inflammation) of the vagina and vulva. Monilial vaginitis is not a sexually transmitted infection. CAUSES  Yeast vaginitis is caused by yeast (candida) that is normally found in your vagina. With a yeast infection, the candida has overgrown in number to a point that upsets the chemical balance. SYMPTOMS   White, thick vaginal discharge.  Swelling, itching, redness and irritation of the vagina and possibly the lips of the vagina (vulva).  Burning or painful urination.  Painful intercourse. DIAGNOSIS  Things that may contribute to monilial vaginitis are:  Postmenopausal and virginal states.  Pregnancy.  Infections.  Being tired, sick or stressed, especially if you had monilial vaginitis in the past.  Diabetes. Good control will help lower the chance.  Birth control pills.  Tight fitting garments.  Using bubble bath, feminine sprays, douches or deodorant tampons.  Taking certain medications that kill germs (antibiotics).  Sporadic recurrence can occur if you become ill. TREATMENT  Your caregiver will give you medication.  There are several kinds of anti monilial vaginal creams and suppositories specific for monilial vaginitis. For recurrent yeast infections, use a suppository or cream in the vagina 2 times a week, or as directed.  Anti-monilial or steroid cream for the itching or irritation of the vulva may also be used. Get your caregiver's permission.  Painting the vagina with methylene blue solution may help if the monilial cream does not work.  Eating yogurt may help prevent monilial vaginitis. HOME CARE INSTRUCTIONS   Finish all medication as prescribed.  Do not have sex until treatment is completed or after your caregiver tells you it is okay.  Take warm sitz baths.  Do not douche.  Do not use tampons, especially  scented ones.  Wear cotton underwear.  Avoid tight pants and panty hose.  Tell your sexual partner that you have a yeast infection. They should go to their caregiver if they have symptoms such as mild rash or itching.  Your sexual partner should be treated as well if your infection is difficult to eliminate.  Practice safer sex. Use condoms.  Some vaginal medications cause latex condoms to fail. Vaginal medications that harm condoms are:  Cleocin cream.  Butoconazole (Femstat).  Terconazole (Terazol) vaginal suppository.  Miconazole (Monistat) (may be purchased over the counter). SEEK MEDICAL CARE IF:   You have a temperature by mouth above 102 F (38.9 C).  The infection is getting worse after 2 days of treatment.  The infection is not getting better after 3 days of treatment.  You develop blisters in or around your vagina.  You develop vaginal bleeding, and it is not your menstrual period.  You have pain when you urinate.  You develop intestinal problems.  You have pain with sexual intercourse. Document Released: 11/26/2004 Document Revised: 05/11/2011 Document Reviewed: 08/10/2008 [facility]ExitCare Patient Information 2014 [place]ExitCare, [place]LL[place].

## 2013-04-03 NOTE — Progress Notes (Signed)
   Patient is a 28 year old who presented to the office complaining of vulvar irritation. She stated that a few weeks ago she was treated for an upper respiratory tract infection and was on antibiotics. With this cycle she uses sanitary napkin because she felt irritated on her external genitalia. She had some Zovirax cream and applied at several times a day for a few days and felt that he gave her some mild relief. She tried some Vagifem over-the-counter and still feels irritated. She has history of HSV 1 but has not had an outbreak in over 2 years. She is with a same steady partner. She also was having pruritus as well. Her last menstrual cycle was in January 25.  Exam: External genitalia labia majora and minora to the area of the fourchette and inguinal creases are erythematous but no specific lesion was noted. Speculum exam demonstrated a thick white discharge which was tested and demonstrated many hyphae. GC and chlamydia culture was obtained results pending at time of this dictation.  Assessment/plan: Patient with contact dermatitis and vulvar irritation and yeast infection. She will be placed on Mycolog to apply 2 times a day for one week. And a prescription for Diflucan 1 by mouth was provided as well. GC and chlamydia culture pending at time of this dictation.

## 2013-04-04 LAB — GC/CHLAMYDIA PROBE AMP
CT Probe RNA: NEGATIVE
GC Probe RNA: NEGATIVE

## 2013-06-15 ENCOUNTER — Other Ambulatory Visit (HOSPITAL_COMMUNITY)
Admission: RE | Admit: 2013-06-15 | Discharge: 2013-06-15 | Disposition: A | Discharge status: Home / Self Care | Payer: 59 | Source: Ambulatory Visit | Attending: Gynecology | Admitting: Gynecology

## 2013-06-15 ENCOUNTER — Ambulatory Visit (INDEPENDENT_AMBULATORY_CARE_PROVIDER_SITE_OTHER): Payer: 59 | Admitting: Gynecology

## 2013-06-15 ENCOUNTER — Encounter: Payer: Self-pay | Admitting: Gynecology

## 2013-06-15 VITALS — BP 122/82 | Ht 66.75 in | Wt 185.0 lb

## 2013-06-15 DIAGNOSIS — Z01419 Encounter for gynecological examination (general) (routine) without abnormal findings: Secondary | ICD-10-CM

## 2013-06-15 DIAGNOSIS — Z1151 Encounter for screening for human papillomavirus (HPV): Secondary | ICD-10-CM | POA: Insufficient documentation

## 2013-06-15 LAB — CBC WITH DIFFERENTIAL/PLATELET
Basophils Absolute: 0 10*3/uL (ref 0.0–0.1)
Basophils Relative: 0 % (ref 0–1)
EOS ABS: 0.1 10*3/uL (ref 0.0–0.7)
EOS PCT: 1 % (ref 0–5)
HEMATOCRIT: 38.3 % (ref 36.0–46.0)
HEMOGLOBIN: 13.3 g/dL (ref 12.0–15.0)
LYMPHS ABS: 2.4 10*3/uL (ref 0.7–4.0)
Lymphocytes Relative: 36 % (ref 12–46)
MCH: 28.5 pg (ref 26.0–34.0)
MCHC: 34.7 g/dL (ref 30.0–36.0)
MCV: 82 fL (ref 78.0–100.0)
MONO ABS: 0.6 10*3/uL (ref 0.1–1.0)
MONOS PCT: 9 % (ref 3–12)
Neutro Abs: 3.6 10*3/uL (ref 1.7–7.7)
Neutrophils Relative %: 54 % (ref 43–77)
Platelets: 317 10*3/uL (ref 150–400)
RBC: 4.67 MIL/uL (ref 3.87–5.11)
RDW: 13.3 % (ref 11.5–15.5)
WBC: 6.7 10*3/uL (ref 4.0–10.5)

## 2013-06-15 LAB — TSH: TSH: 1.229 u[IU]/mL (ref 0.350–4.500)

## 2013-06-15 LAB — CHOLESTEROL, TOTAL: Cholesterol: 165 mg/dL (ref 0–200)

## 2013-06-15 MED ORDER — ETONOGESTREL-ETHINYL ESTRADIOL 0.12-0.015 MG/24HR VA RING: VAGINAL_RING | VAGINAL | Status: DC

## 2013-06-15 NOTE — Addendum Note (Signed)
Addended by: Richardson ChiquitoWILKINSON, Trinda Harlacher S on: 06/15/2013 02:53 PM   Modules accepted: Orders

## 2013-06-15 NOTE — Progress Notes (Signed)
Laurie Reese Feb 11, 1986 [id number]   History:    28 y.o.  for annual gyn exam with no complaints today. Patient is now having normal menstrual cycles and she has been using the NuvaRing and wanted a refill. Patient has completed the HPV vaccine in the past. Patient with no prior history of abnormal Pap smear. Patient is doing her monthly breast exam. Patient in steady relationship.  Past medical history,surgical history, family history and social history were all reviewed and documented in the EPIC chart.  Gynecologic History Patient's last menstrual period was 05/24/2013. Contraception: NuvaRing vaginal inserts Last Pap: 2012. Results were: normal Last mammogram: Not indicated. Results were: Not indicated  Obstetric History OB History  Gravida Para Term Preterm AB SAB TAB Ectopic Multiple Living  0                  ROS: A ROS was performed and pertinent positives and negatives are included in the history.  GENERAL: No fevers or chills. HEENT: No change in vision, no earache, sore throat or sinus congestion. NECK: No pain or stiffness. CARDIOVASCULAR: No chest pain or pressure. No palpitations. PULMONARY: No shortness of breath, cough or wheeze. GASTROINTESTINAL: No abdominal pain, nausea, vomiting or diarrhea, melena or bright red blood per rectum. GENITOURINARY: No urinary frequency, urgency, hesitancy or dysuria. MUSCULOSKELETAL: No joint or muscle pain, no back pain, no recent trauma. DERMATOLOGIC: No rash, no itching, no lesions. ENDOCRINE: No polyuria, polydipsia, no heat or cold intolerance. No recent change in weight. HEMATOLOGICAL: No anemia or easy bruising or bleeding. NEUROLOGIC: No headache, seizures, numbness, tingling or weakness. PSYCHIATRIC: No depression, no loss of interest in normal activity or change in sleep pattern.     Exam: chaperone present  BP 122/82  Ht 5' 6.75" (1.695 m)  Wt 185 lb (83.915 kg)  BMI 29.21 kg/m2  LMP 05/24/2013  Body mass index is  29.21 kg/(m^2).  General appearance : Well developed well nourished female. No acute distress HEENT: Neck supple, trachea midline, no carotid bruits, no thyroidmegaly Lungs: Clear to auscultation, no rhonchi or wheezes, or rib retractions  Heart: Regular rate and rhythm, no murmurs or gallops Breast:Examined in sitting and supine position were symmetrical in appearance, no palpable masses or tenderness,  no skin retraction, no nipple inversion, no nipple discharge, no skin discoloration, no axillary or supraclavicular lymphadenopathy Abdomen: no palpable masses or tenderness, no rebound or guarding Extremities: no edema or skin discoloration or tenderness  Pelvic:  Bartholin, Urethra, Skene Glands: Within normal limits             Vagina: No gross lesions or discharge  Cervix: No gross lesions or discharge  Uterus  anteverted, normal size, shape and consistency, non-tender and mobile  Adnexa  Without masses or tenderness  Anus and perineum  normal   Rectovaginal  normal sphincter tone without palpated masses or tenderness             Hemoccult indicating     Assessment/Plan:  28 y.o. female for annual exam doing well with good cycle control with the NuvaRing. Pap smear was done today in accordance to the new guidelines. A CBC, screen cholesterol, TSH and urinalysis was obtained today.   Note: This dictation was prepared with  Dragon/digital dictation along withSmart phrase technology. Any transcriptional errors that result from this process are unintentional.   Ok Edwards MD, 2:47 PM 06/15/2013

## 2013-06-16 LAB — URINALYSIS W MICROSCOPIC + REFLEX CULTURE
Bacteria, UA: NONE SEEN
Bilirubin Urine: NEGATIVE
Casts: NONE SEEN
Crystals: NONE SEEN
Glucose, UA: NEGATIVE mg/dL
HGB URINE DIPSTICK: NEGATIVE
Ketones, ur: NEGATIVE mg/dL
LEUKOCYTES UA: NEGATIVE
NITRITE: NEGATIVE
PROTEIN: NEGATIVE mg/dL
SQUAMOUS EPITHELIAL / LPF: NONE SEEN
Specific Gravity, Urine: 1.027 (ref 1.005–1.030)
UROBILINOGEN UA: 0.2 mg/dL (ref 0.0–1.0)
pH: 6 (ref 5.0–8.0)

## 2014-06-20 ENCOUNTER — Encounter: Payer: 59 | Admitting: Gynecology

## 2014-06-21 ENCOUNTER — Encounter: Payer: Self-pay | Admitting: Gynecology

## 2014-06-21 ENCOUNTER — Other Ambulatory Visit (HOSPITAL_COMMUNITY)
Admission: RE | Admit: 2014-06-21 | Discharge: 2014-06-21 | Disposition: A | Discharge status: Home / Self Care | Payer: 59 | Source: Ambulatory Visit | Attending: Gynecology | Admitting: Gynecology

## 2014-06-21 ENCOUNTER — Ambulatory Visit (INDEPENDENT_AMBULATORY_CARE_PROVIDER_SITE_OTHER): Payer: 59 | Admitting: Gynecology

## 2014-06-21 VITALS — BP 128/82 | Ht 66.5 in | Wt 185.0 lb

## 2014-06-21 DIAGNOSIS — Z01419 Encounter for gynecological examination (general) (routine) without abnormal findings: Secondary | ICD-10-CM | POA: Insufficient documentation

## 2014-06-21 DIAGNOSIS — Z1151 Encounter for screening for human papillomavirus (HPV): Secondary | ICD-10-CM | POA: Insufficient documentation

## 2014-06-21 DIAGNOSIS — R21 Rash and other nonspecific skin eruption: Secondary | ICD-10-CM | POA: Diagnosis not present

## 2014-06-21 LAB — CBC WITH DIFFERENTIAL/PLATELET
BASOS ABS: 0 10*3/uL (ref 0.0–0.1)
BASOS PCT: 0 % (ref 0–1)
EOS PCT: 1 % (ref 0–5)
Eosinophils Absolute: 0.1 10*3/uL (ref 0.0–0.7)
HCT: 38.9 % (ref 36.0–46.0)
Hemoglobin: 12.7 g/dL (ref 12.0–15.0)
Lymphocytes Relative: 36 % (ref 12–46)
Lymphs Abs: 1.9 10*3/uL (ref 0.7–4.0)
MCH: 27.8 pg (ref 26.0–34.0)
MCHC: 32.6 g/dL (ref 30.0–36.0)
MCV: 85.1 fL (ref 78.0–100.0)
MONO ABS: 0.5 10*3/uL (ref 0.1–1.0)
MONOS PCT: 10 % (ref 3–12)
MPV: 10.1 fL (ref 8.6–12.4)
NEUTROS ABS: 2.9 10*3/uL (ref 1.7–7.7)
Neutrophils Relative %: 53 % (ref 43–77)
Platelets: 289 10*3/uL (ref 150–400)
RBC: 4.57 MIL/uL (ref 3.87–5.11)
RDW: 13.6 % (ref 11.5–15.5)
WBC: 5.4 10*3/uL (ref 4.0–10.5)

## 2014-06-21 MED ORDER — NYSTATIN-TRIAMCINOLONE 100000-0.1 UNIT/GM-% EX CREA: 1.0000 "application " | TOPICAL_CREAM | Freq: Two times a day (BID) | CUTANEOUS | Status: DC

## 2014-06-21 MED ORDER — ETONOGESTREL-ETHINYL ESTRADIOL 0.12-0.015 MG/24HR VA RING: VAGINAL_RING | VAGINAL | Status: DC

## 2014-06-21 NOTE — Progress Notes (Signed)
Laurie Reese 1986/02/01 [id number]   History:    29 y.o.  for annual gyn exam complaining of a left axillary rash. Also review of her record indicated that last year her Pap smear demonstrated atypical squamous cells of undetermined significance with negative HPV. Prior to that patient had no history of abnormal Pap smear. Patient has completed the HPV vaccine series in the past. She's currently using the NuvaRing for contraception. Patient a steady literature. Patient reports normal menstrual cycles.  Past medical history,surgical history, family history and social history were all reviewed and documented in the EPIC chart.  Gynecologic History Patient's last menstrual period was 05/28/2014. Contraception: NuvaRing vaginal inserts Last Pap: 2015. Results were: abnormal Last mammogram: Not indicated. Results were: Not indicated  Obstetric History OB History  Gravida Para Term Preterm AB SAB TAB Ectopic Multiple Living  0                  ROS: A ROS was performed and pertinent positives and negatives are included in the history.  GENERAL: No fevers or chills. HEENT: No change in vision, no earache, sore throat or sinus congestion. NECK: No pain or stiffness. CARDIOVASCULAR: No chest pain or pressure. No palpitations. PULMONARY: No shortness of breath, cough or wheeze. GASTROINTESTINAL: No abdominal pain, nausea, vomiting or diarrhea, melena or bright red blood per rectum. GENITOURINARY: No urinary frequency, urgency, hesitancy or dysuria. MUSCULOSKELETAL: No joint or muscle pain, no back pain, no recent trauma. DERMATOLOGIC: Left axillary rash ENDOCRINE: No polyuria, polydipsia, no heat or cold intolerance. No recent change in weight. HEMATOLOGICAL: No anemia or easy bruising or bleeding. NEUROLOGIC: No headache, seizures, numbness, tingling or weakness. PSYCHIATRIC: No depression, no loss of interest in normal activity or change in sleep pattern.     Exam: chaperone present  BP  128/82 mmHg  Ht 5' 6.5" (1.689 m)  Wt 185 lb (83.915 kg)  BMI 29.42 kg/m2  LMP 05/28/2014  Body mass index is 29.42 kg/(m^2).  General appearance : Well developed well nourished female. No acute distress HEENT: Eyes: no retinal hemorrhage or exudates,  Neck supple, trachea midline, no carotid bruits, no thyroidmegaly Lungs: Clear to auscultation, no rhonchi or wheezes, or rib retractions  Heart: Regular rate and rhythm, no murmurs or gallops Breast:Examined in sitting and supine position were symmetrical in appearance, no palpable masses or tenderness,  no skin retraction, no nipple inversion, no nipple discharge, no skin discoloration, no axillary or supraclavicular lymphadenopathy Abdomen: no palpable masses or tenderness, no rebound or guarding Extremities: no edema or skin discoloration or tenderness Left axillary rash possible contact dermatitis versus early folliculitis  Pelvic:  Bartholin, Urethra, Skene Glands: Within normal limits             Vagina: No gross lesions or discharge  Cervix: No gross lesions or discharge  Uterus  anteverted, normal size, shape and consistency, non-tender and mobile  Adnexa  Without masses or tenderness  Anus and perineum  normal   Rectovaginal  normal sphincter tone without palpated masses or tenderness             Hemoccult not indicated     Assessment/Plan:  29 y.o. female for annual exam with left arm folliculitis versus contact dermatitis we'll be prescribed mytrex cream to apply twice a day for 7-10 days. Because of last year's patient's Pap smear with ASCUS negative HPV a Pap smear with HPV screening was obtained today. Since she was fasting the following screening S were  ordered: Fasting lipid profile, comprehensive metabolic panel, TSH, CBC, and urinalysis.   Ok Edwards MD, 8:33 AM 06/21/2014

## 2014-06-22 LAB — URINALYSIS W MICROSCOPIC + REFLEX CULTURE
Bacteria, UA: NONE SEEN
Bilirubin Urine: NEGATIVE
CASTS: NONE SEEN
Crystals: NONE SEEN
Glucose, UA: NEGATIVE mg/dL
HGB URINE DIPSTICK: NEGATIVE
KETONES UR: NEGATIVE mg/dL
Leukocytes, UA: NEGATIVE
Nitrite: NEGATIVE
PH: 6 (ref 5.0–8.0)
PROTEIN: NEGATIVE mg/dL
Specific Gravity, Urine: 1.024 (ref 1.005–1.030)
Urobilinogen, UA: 0.2 mg/dL (ref 0.0–1.0)

## 2014-06-22 LAB — LIPID PANEL
CHOL/HDL RATIO: 3.3 ratio
CHOLESTEROL: 130 mg/dL (ref 0–200)
HDL: 39 mg/dL — AB (ref >=46)
LDL Cholesterol: 69 mg/dL (ref 0–99)
Triglycerides: 109 mg/dL (ref <150)
VLDL: 22 mg/dL (ref 0–40)

## 2014-06-22 LAB — COMPREHENSIVE METABOLIC PANEL
ALBUMIN: 4 g/dL (ref 3.5–5.2)
ALT: 11 U/L (ref 0–35)
AST: 10 U/L (ref 0–37)
Alkaline Phosphatase: 40 U/L (ref 39–117)
BUN: 11 mg/dL (ref 6–23)
CO2: 26 mEq/L (ref 19–32)
CREATININE: 0.93 mg/dL (ref 0.50–1.10)
Calcium: 9 mg/dL (ref 8.4–10.5)
Chloride: 105 mEq/L (ref 96–112)
Glucose, Bld: 78 mg/dL (ref 70–99)
Potassium: 4.3 mEq/L (ref 3.5–5.3)
Sodium: 140 mEq/L (ref 135–145)
Total Bilirubin: 0.3 mg/dL (ref 0.2–1.2)
Total Protein: 6.8 g/dL (ref 6.0–8.3)

## 2014-06-22 LAB — TSH: TSH: 1.15 u[IU]/mL (ref 0.350–4.500)

## 2014-06-22 LAB — CYTOLOGY - PAP

## 2015-02-22 ENCOUNTER — Telehealth: Payer: 59 | Admitting: Internal Medicine

## 2015-02-22 DIAGNOSIS — R05 Cough: Secondary | ICD-10-CM

## 2015-02-22 DIAGNOSIS — R059 Cough, unspecified: Secondary | ICD-10-CM

## 2015-02-22 NOTE — Progress Notes (Signed)
Based on what you shared with me it looks like you have a serious condition that should be evaluated in a face to face office visit. Based on the duration of your symptoms and increase in sputum with fever you need a chest x-ray.  If you are having a true medical emergency please call 911.  If you need an urgent face to face visit, [facility] has four urgent care centers for your convenience.  Tressie Ellis. [facility]  [phone] Get Driving Directions Find a Provider at this Location  [address] [place], [place] [address] . 8 am to 8 pm Monday-Friday . 9 am to 7 pm Saturday-Sunday  . [facility]  [phone] Get Driving Directions Find a Provider at this Location  [address] [place], [place] [address] . 8 am to 8 pm Monday-Friday . 9 am to 6 pm Saturday . 11 am to 6 pm Sunday   . [facility] [place]  [phone] Get Driving Directions  [address] [place], [place] [address] . 8 am to 8 pm Monday-Friday . 9 am to 4 pm Saturday-Sunday   . [facility] (a walk in primary care provider)  [phone]  Get Driving Directions Find a Provider at this Location  [address] [place], [place] [address] . 8 am to 8:30 pm Monday-Thursday . 8 am to 6 pm Friday . 8 am to 4 pm Saturday-Sunday   Your e-visit answers were reviewed by a board certified advanced clinical practitioner to complete your personal care plan.  Thank you for using e-Visits.

## 2015-04-18 ENCOUNTER — Ambulatory Visit (INDEPENDENT_AMBULATORY_CARE_PROVIDER_SITE_OTHER): Payer: 59 | Admitting: Gynecology

## 2015-04-18 ENCOUNTER — Encounter: Payer: Self-pay | Admitting: Gynecology

## 2015-04-18 VITALS — BP 134/90

## 2015-04-18 DIAGNOSIS — A499 Bacterial infection, unspecified: Secondary | ICD-10-CM | POA: Diagnosis not present

## 2015-04-18 DIAGNOSIS — N921 Excessive and frequent menstruation with irregular cycle: Secondary | ICD-10-CM

## 2015-04-18 DIAGNOSIS — N930 Postcoital and contact bleeding: Secondary | ICD-10-CM | POA: Diagnosis not present

## 2015-04-18 DIAGNOSIS — Z975 Presence of (intrauterine) contraceptive device: Principal | ICD-10-CM

## 2015-04-18 DIAGNOSIS — N760 Acute vaginitis: Secondary | ICD-10-CM | POA: Diagnosis not present

## 2015-04-18 DIAGNOSIS — Z113 Encounter for screening for infections with a predominantly sexual mode of transmission: Secondary | ICD-10-CM

## 2015-04-18 DIAGNOSIS — B3731 Acute candidiasis of vulva and vagina: Secondary | ICD-10-CM

## 2015-04-18 DIAGNOSIS — N898 Other specified noninflammatory disorders of vagina: Secondary | ICD-10-CM | POA: Diagnosis not present

## 2015-04-18 DIAGNOSIS — B9689 Other specified bacterial agents as the cause of diseases classified elsewhere: Secondary | ICD-10-CM

## 2015-04-18 DIAGNOSIS — B373 Candidiasis of vulva and vagina: Secondary | ICD-10-CM | POA: Diagnosis not present

## 2015-04-18 LAB — WET PREP FOR TRICH, YEAST, CLUE: TRICH WET PREP: NONE SEEN

## 2015-04-18 LAB — PREGNANCY, URINE: PREG TEST UR: NEGATIVE

## 2015-04-18 MED ORDER — TINIDAZOLE 500 MG PO TABS: ORAL_TABLET | ORAL | Status: DC

## 2015-04-18 MED ORDER — FLUCONAZOLE 150 MG PO TABS: 150.0000 mg | ORAL_TABLET | Freq: Once | ORAL | Status: DC

## 2015-04-18 NOTE — Progress Notes (Signed)
   Patient is a 30 year old who presented to the office today stating she was having slight brownish discharge. She is using the NuvaRing for contraception. Her menstrual cycles as follows:  In December the last week of the month she bled for 5-7 days January 27 she bled heavy for 2 days then brownish discharge for 1 day been her cycle stopped February her cycle start on the 10th and had brownish discharge until yesterday.  Patient states she's had a new partner in the past 6 months would like to have an STD screen. Her urine pregnancy today in the office was negative. She denies any nausea, vomiting, fever, chills, back pain, no nipple discharge or any bloody vision or unusual headaches.  Exam: Abdomen: Soft nontender no rebound or guarding Pelvic: Bartholin urethra Skene was within normal limits Vagina: Vaginal ring was noted started brown discharge was noted Cervix: Same as above GC and Chlamydia culture and wet prep was obtained After the NuvaRing was removed bimanual exam demonstrated a normal-size uterus anteverted normal size shape and consistency with no palpable adnexal masses or tenderness  Rectal exam: Not done  Wet prep few yeast, moderate clue cells, moderate WBC, too numerous to count bacteria  The NuvaRing was replaced back into the vagina  Assessment/plan: #1 yeast vaginitis will be treated with Diflucan 150 mg 1 by mouth today #2 for her bacterial vaginosis she will be prescribed Tindamax 500 mg tablets. She will take 4 tablets today and repeat in 24 hours #3 to complete her STD screen in addition to the [facility] and Chlamydia culture she will stop by the lab and the following will be ordered: Hepatitis B, hepatitis C, RPR, and HIV. #4 after treatment for infection above she continues to have irregular bleeding she'll return back to the office otherwise she scheduled to return back in April for annual exam or when necessary.

## 2015-04-18 NOTE — Patient Instructions (Signed)
Fluconazole tablets What is this medicine? FLUCONAZOLE (floo KON na zole) is an antifungal medicine. It is used to treat certain kinds of fungal or yeast infections. This medicine may be used for other purposes; ask your health care provider or pharmacist if you have questions. What should I tell my health care provider before I take this medicine? They need to know if you have any of these conditions: -electrolyte abnormalities -history of irregular heart beat -kidney disease -an unusual or allergic reaction to fluconazole, other azole antifungals, medicines, foods, dyes, or preservatives -pregnant or trying to get pregnant -breast-feeding How should I use this medicine? Take this medicine by mouth. Follow the directions on the prescription label. Do not take your medicine more often than directed. Talk to your pediatrician regarding the use of this medicine in children. Special care may be needed. This medicine has been used in children as young as 58 months of age. Overdosage: If you think you have taken too much of this medicine contact a poison control center or emergency room at once. NOTE: This medicine is only for you. Do not share this medicine with others. What if I miss a dose? If you miss a dose, take it as soon as you can. If it is almost time for your next dose, take only that dose. Do not take double or extra doses. What may interact with this medicine? Do not take this medicine with any of the following medications: -astemizole -certain medicines for irregular heart beat like dofetilide, dronedarone, quinidine -cisapride -erythromycin -lomitapide -other medicines that prolong the QT interval (cause an abnormal heart rhythm) -pimozide -terfenadine -thioridazine -tolvaptan -ziprasidone This medicine may also interact with the following medications: -antiviral medicines for HIV or AIDS -birth control pills -certain antibiotics like rifabutin, rifampin -certain  medicines for blood pressure like amlodipine, isradipine, felodipine, hydrochlorothiazide, losartan, nifedipine -certain medicines for cancer like cyclophosphamide, vinblastine, vincristine -certain medicines for cholesterol like atorvastatin, lovastatin, fluvastatin, simvastatin -certain medicines for depression, anxiety, or psychotic disturbances like amitriptyline, midazolam, nortriptyline, triazolam -certain medicines for diabetes like glipizide, glyburide, tolbutamide -certain medicines for pain like alfentanil, fentanyl, methadone -certain medicines for seizures like carbamazepine, phenytoin -certain medicines that treat or prevent blood clots like warfarin -halofantrine -medicines that lower your chance of fighting infection like cyclosporine, prednisone, tacrolimus -NSAIDS, medicines for pain and inflammation, like celecoxib, diclofenac, flurbiprofen, ibuprofen, meloxicam, naproxen -other medicines for fungal infections -sirolimus -theophylline -tofacitinib This list may not describe all possible interactions. Give your health care provider a list of all the medicines, herbs, non-prescription drugs, or dietary supplements you use. Also tell them if you smoke, drink alcohol, or use illegal drugs. Some items may interact with your medicine. What should I watch for while using this medicine? Visit your doctor or health care professional for regular checkups. If you are taking this medicine for a long time you may need blood work. Tell your doctor if your symptoms do not improve. Some fungal infections need many weeks or months of treatment to cure. Alcohol can increase possible damage to your liver. Avoid alcoholic drinks. If you have a vaginal infection, do not have sex until you have finished your treatment. You can wear a sanitary napkin. Do not use tampons. Wear freshly washed cotton, not synthetic, panties. What side effects may I notice from receiving this medicine? Side effects that  you should report to your doctor or health care professional as soon as possible: -allergic reactions like skin rash or itching, hives, swelling of the lips, mouth,  tongue, or throat -dark urine -feeling dizzy or faint -irregular heartbeat or chest pain -redness, blistering, peeling or loosening of the skin, including inside the mouth -trouble breathing -unusual bruising or bleeding -vomiting -yellowing of the eyes or skin Side effects that usually do not require medical attention (report to your doctor or health care professional if they continue or are bothersome): -changes in how food tastes -diarrhea -headache -stomach upset or nausea This list may not describe all possible side effects. Call your doctor for medical advice about side effects. You may report side effects to FDA at 1-800-FDA-1088. Where should I keep my medicine? Keep out of the reach of children. Store at room temperature below 30 degrees C (86 degrees F). Throw away any medicine after the expiration date. NOTE: This sheet is a summary. It may not cover all possible information. If you have questions about this medicine, talk to your doctor, pharmacist, or health care provider.    2016, Elsevier/Gold Standard. (2012-09-24 16:13:04) Tinidazole tablets What is this medicine? TINIDAZOLE (tye NI da zole) is an antiinfective. It is used to treat amebiasis, giardiasis, trichomoniasis, and vaginosis. It will not work for colds, flu, or other viral infections. This medicine may be used for other purposes; ask your health care provider or pharmacist if you have questions. What should I tell my health care provider before I take this medicine? They need to know if you have any of these conditions: -anemia or other blood disorders -if you frequently drink alcohol containing drinks -receiving hemodialysis -seizure disorder -an unusual or allergic reaction to tinidazole, other medicines, foods, dyes, or  preservatives -pregnant or trying to get pregnant -breast-feeding How should I use this medicine? Take this medicine by mouth with a full glass of water. Follow the directions on the prescription label. Take with food. Take your medicine at regular intervals. Do not take your medicine more often than directed. Take all of your medicine as directed even if you think you are better. Do not skip doses or stop your medicine early. Talk to your pediatrician regarding the use of this medicine in children. While this drug may be prescribed for children as young as 78 years of age for selected conditions, precautions do apply. Overdosage: If you think you have taken too much of this medicine contact a poison control center or emergency room at once. NOTE: This medicine is only for you. Do not share this medicine with others. What if I miss a dose? If you miss a dose, take it as soon as you can. If it is almost time for your next dose, take only that dose. Do not take double or extra doses. What may interact with this medicine? Do not take this medicine with any of the following medications: -alcohol or any product that contains alcohol -amprenavir oral solution -disulfiram -paclitaxel injection -ritonavir oral solution -sertraline oral solution -sulfamethoxazole-trimethoprim injection This medicine may also interact with the following medications: -cholestyramine -cimetidine -conivaptan -cyclosporin -fluorouracil -fosphenytoin, phenytoin -ketoconazole -lithium -phenobarbital -tacrolimus -warfarin This list may not describe all possible interactions. Give your health care provider a list of all the medicines, herbs, non-prescription drugs, or dietary supplements you use. Also tell them if you smoke, drink alcohol, or use illegal drugs. Some items may interact with your medicine. What should I watch for while using this medicine? Tell your doctor or health care professional if your symptoms do  not improve or if they get worse. Avoid alcoholic drinks while you are taking this medicine and for  three days afterward. Alcohol may make you feel dizzy, sick, or flushed. If you are being treated for a sexually transmitted disease, avoid sexual contact until you have finished your treatment. Your sexual partner may also need treatment. What side effects may I notice from receiving this medicine? Side effects that you should report to your doctor or health care professional as soon as possible: -allergic reactions like skin rash, itching or hives, swelling of the face, lips, or tongue -breathing problems -confusion, depression -dark or white patches in the mouth -feeling faint or lightheaded, falls -fever, infection -numbness, tingling, pain or weakness in the hands or feet -pain when passing urine -seizures -unusually weak or tired -vaginal irritation or discharge -vomiting Side effects that usually do not require medical attention (report to your doctor or health care professional if they continue or are bothersome): -dark brown or reddish urine -diarrhea -headache -loss of appetite -metallic taste -nausea -stomach upset This list may not describe all possible side effects. Call your doctor for medical advice about side effects. You may report side effects to FDA at 1-800-FDA-1088. Where should I keep my medicine? Keep out of the reach of children. Store at room temperature between 15 and 30 degrees C (59 and 86 degrees F). Protect from light and moisture. Keep container tightly closed. Throw away any unused medicine after the expiration date. NOTE: This sheet is a summary. It may not cover all possible information. If you have questions about this medicine, talk to your doctor, pharmacist, or health care provider.    2016, Elsevier/Gold Standard. (2007-11-14 15:22:28) Monilial Vaginitis Vaginitis in a soreness, swelling and redness (inflammation) of the vagina and vulva.  Monilial vaginitis is not a sexually transmitted infection. CAUSES  Yeast vaginitis is caused by yeast (candida) that is normally found in your vagina. With a yeast infection, the candida has overgrown in number to a point that upsets the chemical balance. SYMPTOMS   White, thick vaginal discharge.  Swelling, itching, redness and irritation of the vagina and possibly the lips of the vagina (vulva).  Burning or painful urination.  Painful intercourse. DIAGNOSIS  Things that may contribute to monilial vaginitis are:  Postmenopausal and virginal states.  Pregnancy.  Infections.  Being tired, sick or stressed, especially if you had monilial vaginitis in the past.  Diabetes. Good control will help lower the chance.  Birth control pills.  Tight fitting garments.  Using bubble bath, feminine sprays, douches or deodorant tampons.  Taking certain medications that kill germs (antibiotics).  Sporadic recurrence can occur if you become ill. TREATMENT  Your caregiver will give you medication.  There are several kinds of anti monilial vaginal creams and suppositories specific for monilial vaginitis. For recurrent yeast infections, use a suppository or cream in the vagina 2 times a week, or as directed.  Anti-monilial or steroid cream for the itching or irritation of the vulva may also be used. Get your caregiver's permission.  Painting the vagina with methylene blue solution may help if the monilial cream does not work.  Eating yogurt may help prevent monilial vaginitis. HOME CARE INSTRUCTIONS   Finish all medication as prescribed.  Do not have sex until treatment is completed or after your caregiver tells you it is okay.  Take warm sitz baths.  Do not douche.  Do not use tampons, especially scented ones.  Wear cotton underwear.  Avoid tight pants and panty hose.  Tell your sexual partner that you have a yeast infection. They should go to their caregiver   if they have  symptoms such as mild rash or itching.  Your sexual partner should be treated as well if your infection is difficult to eliminate.  Practice safer sex. Use condoms.  Some vaginal medications cause latex condoms to fail. Vaginal medications that harm condoms are:  Cleocin cream.  Butoconazole (Femstat).  Terconazole (Terazol) vaginal suppository.  Miconazole (Monistat) (may be purchased over the counter). SEEK MEDICAL CARE IF:   You have a temperature by mouth above 102 F (38.9 C).  The infection is getting worse after 2 days of treatment.  The infection is not getting better after 3 days of treatment.  You develop blisters in or around your vagina.  You develop vaginal bleeding, and it is not your menstrual period.  You have pain when you urinate.  You develop intestinal problems.  You have pain with sexual intercourse.   This information is not intended to replace advice given to you by your health care provider. Make sure you discuss any questions you have with your health care provider.   Document Released: 11/26/2004 Document Revised: 05/11/2011 Document Reviewed: 08/20/2014 Elsevier Interactive Patient Education 2016 Elsevier Inc. Bacterial Vaginosis Bacterial vaginosis is a vaginal infection that occurs when the normal balance of bacteria in the vagina is disrupted. It results from an overgrowth of certain bacteria. This is the most common vaginal infection in women of childbearing age. Treatment is important to prevent complications, especially in pregnant women, as it can cause a premature delivery. CAUSES  Bacterial vaginosis is caused by an increase in harmful bacteria that are normally present in smaller amounts in the vagina. Several different kinds of bacteria can cause bacterial vaginosis. However, the reason that the condition develops is not fully understood. RISK FACTORS Certain activities or behaviors can put you at an increased risk of developing  bacterial vaginosis, including:  Having a new sex partner or multiple sex partners.  Douching.  Using an intrauterine device (IUD) for contraception. Women do not get bacterial vaginosis from toilet seats, bedding, swimming pools, or contact with objects around them. SIGNS AND SYMPTOMS  Some women with bacterial vaginosis have no signs or symptoms. Common symptoms include:  Grey vaginal discharge.  A fishlike odor with discharge, especially after sexual intercourse.  Itching or burning of the vagina and vulva.  Burning or pain with urination. DIAGNOSIS  Your health care provider will take a medical history and examine the vagina for signs of bacterial vaginosis. A sample of vaginal fluid may be taken. Your health care provider will look at this sample under a microscope to check for bacteria and abnormal cells. A vaginal pH test may also be done.  TREATMENT  Bacterial vaginosis may be treated with antibiotic medicines. These may be given in the form of a pill or a vaginal cream. A second round of antibiotics may be prescribed if the condition comes back after treatment. Because bacterial vaginosis increases your risk for sexually transmitted diseases, getting treated can help reduce your risk for chlamydia, gonorrhea, HIV, and herpes. HOME CARE INSTRUCTIONS   Only take over-the-counter or prescription medicines as directed by your health care provider.  If antibiotic medicine was prescribed, take it as directed. Make sure you finish it even if you start to feel better.  Tell all sexual partners that you have a vaginal infection. They should see their health care provider and be treated if they have problems, such as a mild rash or itching.  During treatment, it is important that you follow these  instructions:  Avoid sexual activity or use condoms correctly.  Do not douche.  Avoid alcohol as directed by your health care provider.  Avoid breastfeeding as directed by your health  care provider. SEEK MEDICAL CARE IF:   Your symptoms are not improving after 3 days of treatment.  You have increased discharge or pain.  You have a fever. MAKE SURE YOU:   Understand these instructions.  Will watch your condition.  Will get help right away if you are not doing well or get worse. FOR MORE INFORMATION  Centers for Disease Control and Prevention, Division of STD Prevention: SolutionApps.co.zawww.cdc.gov/std American Sexual Health Association (ASHA): www.ashastd.org    This information is not intended to replace advice given to you by your health care provider. Make sure you discuss any questions you have with your health care provider.   Document Released: 02/16/2005 Document Revised: 03/09/2014 Document Reviewed: 09/28/2012 Elsevier Interactive Patient Education [employer]2016 Elsevier Inc.

## 2015-04-19 LAB — GC/CHLAMYDIA PROBE AMP
CT Probe RNA: NOT DETECTED
GC Probe RNA: NOT DETECTED

## 2015-04-19 LAB — HIV ANTIBODY (ROUTINE TESTING W REFLEX): HIV 1&2 Ab, 4th Generation: NONREACTIVE

## 2015-04-19 LAB — HEPATITIS C ANTIBODY: HCV Ab: NEGATIVE

## 2015-04-19 LAB — HEPATITIS B SURFACE ANTIGEN: Hepatitis B Surface Ag: NEGATIVE

## 2015-04-19 LAB — RPR

## 2015-05-21 DIAGNOSIS — H52223 Regular astigmatism, bilateral: Secondary | ICD-10-CM | POA: Diagnosis not present

## 2015-05-21 DIAGNOSIS — H5213 Myopia, bilateral: Secondary | ICD-10-CM | POA: Diagnosis not present

## 2015-06-24 ENCOUNTER — Ambulatory Visit (INDEPENDENT_AMBULATORY_CARE_PROVIDER_SITE_OTHER): Payer: 59 | Admitting: Gynecology

## 2015-06-24 ENCOUNTER — Encounter: Payer: Self-pay | Admitting: Gynecology

## 2015-06-24 VITALS — BP 124/82 | Ht 67.0 in | Wt 190.0 lb

## 2015-06-24 DIAGNOSIS — Z01419 Encounter for gynecological examination (general) (routine) without abnormal findings: Secondary | ICD-10-CM

## 2015-06-24 LAB — URINALYSIS W MICROSCOPIC + REFLEX CULTURE
BACTERIA UA: NONE SEEN [HPF]
BILIRUBIN URINE: NEGATIVE
Casts: NONE SEEN [LPF]
Crystals: NONE SEEN [HPF]
GLUCOSE, UA: NEGATIVE
HGB URINE DIPSTICK: NEGATIVE
Ketones, ur: NEGATIVE
LEUKOCYTES UA: NEGATIVE
Nitrite: NEGATIVE
PROTEIN: NEGATIVE
RBC / HPF: NONE SEEN RBC/HPF (ref <=2)
Specific Gravity, Urine: 1.025 (ref 1.001–1.035)
WBC UA: NONE SEEN WBC/HPF (ref <=5)
Yeast: NONE SEEN [HPF]
pH: 6 (ref 5.0–8.0)

## 2015-06-24 LAB — COMPREHENSIVE METABOLIC PANEL
ALBUMIN: 3.9 g/dL (ref 3.6–5.1)
ALK PHOS: 36 U/L (ref 33–115)
ALT: 10 U/L (ref 6–29)
AST: 18 U/L (ref 10–30)
BILIRUBIN TOTAL: 0.3 mg/dL (ref 0.2–1.2)
BUN: 12 mg/dL (ref 7–25)
CALCIUM: 8.9 mg/dL (ref 8.6–10.2)
CO2: 23 mmol/L (ref 20–31)
Chloride: 107 mmol/L (ref 98–110)
Creat: 0.81 mg/dL (ref 0.50–1.10)
Glucose, Bld: 85 mg/dL (ref 65–99)
Potassium: 4.3 mmol/L (ref 3.5–5.3)
Sodium: 139 mmol/L (ref 135–146)
Total Protein: 6.5 g/dL (ref 6.1–8.1)

## 2015-06-24 LAB — CBC WITH DIFFERENTIAL/PLATELET
BASOS ABS: 73 {cells}/uL (ref 0–200)
Basophils Relative: 1 %
EOS ABS: 146 {cells}/uL (ref 15–500)
EOS PCT: 2 %
HEMATOCRIT: 37.2 % (ref 35.0–45.0)
HEMOGLOBIN: 12.3 g/dL (ref 11.7–15.5)
LYMPHS ABS: 2920 {cells}/uL (ref 850–3900)
Lymphocytes Relative: 40 %
MCH: 28 pg (ref 27.0–33.0)
MCHC: 33.1 g/dL (ref 32.0–36.0)
MCV: 84.7 fL (ref 80.0–100.0)
MPV: 9.8 fL (ref 7.5–12.5)
Monocytes Absolute: 803 cells/uL (ref 200–950)
Monocytes Relative: 11 %
NEUTROS ABS: 3358 {cells}/uL (ref 1500–7800)
NEUTROS PCT: 46 %
Platelets: 273 10*3/uL (ref 140–400)
RBC: 4.39 MIL/uL (ref 3.80–5.10)
RDW: 13.3 % (ref 11.0–15.0)
WBC: 7.3 10*3/uL (ref 3.8–10.8)

## 2015-06-24 LAB — LIPID PANEL
Cholesterol: 148 mg/dL (ref 125–200)
HDL: 44 mg/dL — AB (ref >=46)
LDL Cholesterol: 82 mg/dL (ref <130)
TRIGLYCERIDES: 108 mg/dL (ref <150)
Total CHOL/HDL Ratio: 3.4 Ratio (ref <=5.0)
VLDL: 22 mg/dL (ref <30)

## 2015-06-24 LAB — TSH: TSH: 1.15 mIU/L

## 2015-06-24 MED ORDER — NYSTATIN-TRIAMCINOLONE 100000-0.1 UNIT/GM-% EX CREA: 1.0000 "application " | TOPICAL_CREAM | Freq: Two times a day (BID) | CUTANEOUS | Status: DC

## 2015-06-24 MED ORDER — ETONOGESTREL-ETHINYL ESTRADIOL 0.12-0.015 MG/24HR VA RING: VAGINAL_RING | VAGINAL | Status: DC

## 2015-06-24 NOTE — Progress Notes (Signed)
Laurie Reese [birth year] [id number]   History:    30 y.o.  for annual gyn exam with no major complaints today. She does feel low but dried the external genitalia right before her menses. She has been using the NuvaRing for contraception and has done well. No change in sexual partners. Review of patient's record indicated that in 2015 her Pap smear demonstrated atypical squamous cells of undetermined significance with negative HPV. Follow-up Pap smear 2016 was normal. Patient has completed the HPV vaccine series in the past.  Past medical history,surgical history, family history and social history were all reviewed and documented in the EPIC chart.  Gynecologic History Patient's last menstrual period was 05/28/2015. Contraception: NuvaRing vaginal inserts Last Pap: 2016. Results were: normal Last mammogram: Not indicated. Results were: Not indicated  Obstetric History OB History  Gravida Para Term Preterm AB SAB TAB Ectopic Multiple Living  0                  ROS: A ROS was performed and pertinent positives and negatives are included in the history.  GENERAL: No fevers or chills. HEENT: No change in vision, no earache, sore throat or sinus congestion. NECK: No pain or stiffness. CARDIOVASCULAR: No chest pain or pressure. No palpitations. PULMONARY: No shortness of breath, cough or wheeze. GASTROINTESTINAL: No abdominal pain, nausea, vomiting or diarrhea, melena or bright red blood per rectum. GENITOURINARY: No urinary frequency, urgency, hesitancy or dysuria. MUSCULOSKELETAL: No joint or muscle pain, no back pain, no recent trauma. DERMATOLOGIC: No rash, no itching, no lesions. ENDOCRINE: No polyuria, polydipsia, no heat or cold intolerance. No recent change in weight. HEMATOLOGICAL: No anemia or easy bruising or bleeding. NEUROLOGIC: No headache, seizures, numbness, tingling or weakness. PSYCHIATRIC: No depression, no loss of interest in normal activity or change in sleep pattern.      Exam: chaperone present  BP 124/82 mmHg  Ht 5\' 7"  (1.702 m)  Wt 190 lb (86.183 kg)  BMI 29.75 kg/m2  LMP 05/28/2015  Body mass index is 29.75 kg/(m^2).  General appearance : Well developed well nourished female. No acute distress HEENT: Eyes: no retinal hemorrhage or exudates,  Neck supple, trachea midline, no carotid bruits, no thyroidmegaly Lungs: Clear to auscultation, no rhonchi or wheezes, or rib retractions  Heart: Regular rate and rhythm, no murmurs or gallops Breast:Examined in sitting and supine position were symmetrical in appearance, no palpable masses or tenderness,  no skin retraction, no nipple inversion, no nipple discharge, no skin discoloration, no axillary or supraclavicular lymphadenopathy Abdomen: no palpable masses or tenderness, no rebound or guarding Extremities: no edema or skin discoloration or tenderness  Pelvic:  Bartholin, Urethra, Skene Glands: Within normal limits             Vagina: No gross lesions or discharge  Cervix: No gross lesions or discharge  Uterus  anteverted, normal size, shape and consistency, non-tender and mobile  Adnexa  Without masses or tenderness  Anus and perineum  normal   Rectovaginal  normal sphincter tone without palpated masses or tenderness             Hemoccult not indicated     Assessment/Plan:  30 y.o. female for annual exam with some slight forward to she can use Mycolog cream the same as when she develops a rash underneath her arm when necessary. The following screening blood work was ordered: Fasting lipid profile, comprehensive metabolic panel, TSH, CBC, and urinalysis. Pap smear not indicated this year. Patient was  reminded to do her monthly breast exam.   Ok Edwards MD, 8:22 AM 06/24/2015

## 2015-10-07 ENCOUNTER — Encounter: Payer: Self-pay | Admitting: Gynecology

## 2015-10-08 ENCOUNTER — Encounter: Payer: Self-pay | Admitting: Gynecology

## 2015-10-08 ENCOUNTER — Ambulatory Visit (INDEPENDENT_AMBULATORY_CARE_PROVIDER_SITE_OTHER): Payer: 59 | Admitting: Gynecology

## 2015-10-08 VITALS — BP 132/88

## 2015-10-08 DIAGNOSIS — A499 Bacterial infection, unspecified: Secondary | ICD-10-CM

## 2015-10-08 DIAGNOSIS — Z113 Encounter for screening for infections with a predominantly sexual mode of transmission: Secondary | ICD-10-CM | POA: Diagnosis not present

## 2015-10-08 DIAGNOSIS — N760 Acute vaginitis: Secondary | ICD-10-CM | POA: Diagnosis not present

## 2015-10-08 DIAGNOSIS — N9489 Other specified conditions associated with female genital organs and menstrual cycle: Secondary | ICD-10-CM | POA: Diagnosis not present

## 2015-10-08 DIAGNOSIS — N898 Other specified noninflammatory disorders of vagina: Secondary | ICD-10-CM | POA: Diagnosis not present

## 2015-10-08 DIAGNOSIS — B9689 Other specified bacterial agents as the cause of diseases classified elsewhere: Secondary | ICD-10-CM

## 2015-10-08 LAB — WET PREP FOR TRICH, YEAST, CLUE
TRICH WET PREP: NONE SEEN
YEAST WET PREP: NONE SEEN

## 2015-10-08 MED ORDER — METRONIDAZOLE 500 MG PO TABS: 500.0000 mg | ORAL_TABLET | Freq: Two times a day (BID) | ORAL | 0 refills | Status: DC

## 2015-10-08 NOTE — Patient Instructions (Signed)
Metronidazole tablets or capsules What is this medicine? METRONIDAZOLE (me troe NI da zole) is an antiinfective. It is used to treat certain kinds of bacterial and protozoal infections. It will not work for colds, flu, or other viral infections. This medicine may be used for other purposes; ask your health care provider or pharmacist if you have questions. What should I tell my health care provider before I take this medicine? They need to know if you have any of these conditions: -anemia or other blood disorders -disease of the nervous system -fungal or yeast infection -if you drink alcohol containing drinks -liver disease -seizures -an unusual or allergic reaction to metronidazole, or other medicines, foods, dyes, or preservatives -pregnant or trying to get pregnant -breast-feeding How should I use this medicine? Take this medicine by mouth with a full glass of water. Follow the directions on the prescription label. Take your medicine at regular intervals. Do not take your medicine more often than directed. Take all of your medicine as directed even if you think you are better. Do not skip doses or stop your medicine early. Talk to your pediatrician regarding the use of this medicine in children. Special care may be needed. Overdosage: If you think you have taken too much of this medicine contact a poison control center or emergency room at once. NOTE: This medicine is only for you. Do not share this medicine with others. What if I miss a dose? If you miss a dose, take it as soon as you can. If it is almost time for your next dose, take only that dose. Do not take double or extra doses. What may interact with this medicine? Do not take this medicine with any of the following medications: -alcohol or any product that contains alcohol -amprenavir oral solution -cisapride -disulfiram -dofetilide -dronedarone -paclitaxel injection -pimozide -ritonavir oral solution -sertraline oral  solution -sulfamethoxazole-trimethoprim injection -thioridazine -ziprasidone This medicine may also interact with the following medications: -birth control pills -cimetidine -lithium -other medicines that prolong the QT interval (cause an abnormal heart rhythm) -phenobarbital -phenytoin -warfarin This list may not describe all possible interactions. Give your health care provider a list of all the medicines, herbs, non-prescription drugs, or dietary supplements you use. Also tell them if you smoke, drink alcohol, or use illegal drugs. Some items may interact with your medicine. What should I watch for while using this medicine? Tell your doctor or health care professional if your symptoms do not improve or if they get worse. You may get drowsy or dizzy. Do not drive, use machinery, or do anything that needs mental alertness until you know how this medicine affects you. Do not stand or sit up quickly, especially if you are an older patient. This reduces the risk of dizzy or fainting spells. Avoid alcoholic drinks while you are taking this medicine and for three days afterward. Alcohol may make you feel dizzy, sick, or flushed. If you are being treated for a sexually transmitted disease, avoid sexual contact until you have finished your treatment. Your sexual partner may also need treatment. What side effects may I notice from receiving this medicine? Side effects that you should report to your doctor or health care professional as soon as possible: -allergic reactions like skin rash or hives, swelling of the face, lips, or tongue -confusion, clumsiness -difficulty speaking -discolored or sore mouth -dizziness -fever, infection -numbness, tingling, pain or weakness in the hands or feet -trouble passing urine or change in the amount of urine -redness, blistering, peeling   or loosening of the skin, including inside the mouth -seizures -unusually weak or tired -vaginal irritation, dryness,  or discharge Side effects that usually do not require medical attention (report to your doctor or health care professional if they continue or are bothersome): -diarrhea -headache -irritability -metallic taste -nausea -stomach pain or cramps -trouble sleeping This list may not describe all possible side effects. Call your doctor for medical advice about side effects. You may report side effects to FDA at 1-800-FDA-1088. Where should I keep my medicine? Keep out of the reach of children. Store at room temperature below 25 degrees C (77 degrees F). Protect from light. Keep container tightly closed. Throw away any unused medicine after the expiration date. NOTE: This sheet is a summary. It may not cover all possible information. If you have questions about this medicine, talk to your doctor, pharmacist, or health care provider.    2016, Elsevier/Gold Standard. (2012-09-23 14:08:39) Bacterial Vaginosis Bacterial vaginosis is a vaginal infection that occurs when the normal balance of bacteria in the vagina is disrupted. It results from an overgrowth of certain bacteria. This is the most common vaginal infection in women of childbearing age. Treatment is important to prevent complications, especially in pregnant women, as it can cause a premature delivery. CAUSES  Bacterial vaginosis is caused by an increase in harmful bacteria that are normally present in smaller amounts in the vagina. Several different kinds of bacteria can cause bacterial vaginosis. However, the reason that the condition develops is not fully understood. RISK FACTORS Certain activities or behaviors can put you at an increased risk of developing bacterial vaginosis, including:  Having a new sex partner or multiple sex partners.  Douching.  Using an intrauterine device (IUD) for contraception. Women do not get bacterial vaginosis from toilet seats, bedding, swimming pools, or contact with objects around them. SIGNS AND  SYMPTOMS  Some women with bacterial vaginosis have no signs or symptoms. Common symptoms include:  Grey vaginal discharge.  A fishlike odor with discharge, especially after sexual intercourse.  Itching or burning of the vagina and vulva.  Burning or pain with urination. DIAGNOSIS  Your health care provider will take a medical history and examine the vagina for signs of bacterial vaginosis. A sample of vaginal fluid may be taken. Your health care provider will look at this sample under a microscope to check for bacteria and abnormal cells. A vaginal pH test may also be done.  TREATMENT  Bacterial vaginosis may be treated with antibiotic medicines. These may be given in the form of a pill or a vaginal cream. A second round of antibiotics may be prescribed if the condition comes back after treatment. Because bacterial vaginosis increases your risk for sexually transmitted diseases, getting treated can help reduce your risk for chlamydia, gonorrhea, HIV, and herpes. HOME CARE INSTRUCTIONS   Only take over-the-counter or prescription medicines as directed by your health care provider.  If antibiotic medicine was prescribed, take it as directed. Make sure you finish it even if you start to feel better.  Tell all sexual partners that you have a vaginal infection. They should see their health care provider and be treated if they have problems, such as a mild rash or itching.  During treatment, it is important that you follow these instructions:  Avoid sexual activity or use condoms correctly.  Do not douche.  Avoid alcohol as directed by your health care provider.  Avoid breastfeeding as directed by your health care provider. SEEK MEDICAL CARE IF:     Your symptoms are not improving after 3 days of treatment.  You have increased discharge or pain.  You have a fever. MAKE SURE YOU:   Understand these instructions.  Will watch your condition.  Will get help right away if you are not  doing well or get worse. FOR MORE INFORMATION  Centers for Disease Control and Prevention, Division of STD Prevention: www.cdc.gov/std American Sexual Health Association (ASHA): www.ashastd.org    This information is not intended to replace advice given to you by your health care provider. Make sure you discuss any questions you have with your health care provider.   Document Released: 02/16/2005 Document Revised: 03/09/2014 Document Reviewed: 09/28/2012 Elsevier Interactive Patient Education 2016 Elsevier Inc.  

## 2015-10-08 NOTE — Progress Notes (Signed)
   HPI: Patient is a 30 year old who presented to the office stating that since last week she began having a gray-like vaginal discharge with odor. She has been a new sexual relation with a new partner for the past 3 months wanted to have an STD screen. She had no vaginal vulvar pruritus. She denies any back pain, fever, chills, nausea, vomiting no change in appetite. She had tried Vagistat over-the-counter with no resolution to her symptoms. She even tried a probiotic gel vaginally and it did not help her symptoms.   ROS: A ROS was performed and pertinent positives and negatives are included in the history.  GENERAL: No fevers or chills. HEENT: No change in vision, no earache, sore throat or sinus congestion. NECK: No pain or stiffness. CARDIOVASCULAR: No chest pain or pressure. No palpitations. PULMONARY: No shortness of breath, cough or wheeze. GASTROINTESTINAL: No abdominal pain, nausea, vomiting or diarrhea, melena or bright red blood per rectum. GENITOURINARY: No urinary frequency, urgency, hesitancy or dysuria. MUSCULOSKELETAL: No joint or muscle pain, no back pain, no recent trauma. DERMATOLOGIC: No rash, no itching, no lesions. ENDOCRINE: No polyuria, polydipsia, no heat or cold intolerance. No recent change in weight. HEMATOLOGICAL: No anemia or easy bruising or bleeding. NEUROLOGIC: No headache, seizures, numbness, tingling or weakness. PSYCHIATRIC: No depression, no loss of interest in normal activity or change in sleep pattern.   PE: Blood pressure 132/88 Gen. appearance well-developed will nurse female with the above-mentioned complaining Back: No CVA tenderness Pelvic: Bartholin urethra Skene was within normal limits Vagina: Clear discharge with fishy odor, NuvaRing noted Cervix: No lesions or discharge Bimanual exam not done Rectal exam: Not done  Wet prep moderate clue cells, moderate white blood cell, too numerous to count bacteria   Assessment Plan: Signs and symptoms along with  clinical evidence of bacterial vaginosis will be treated with Flagyl 500 mg one by mouth twice a day for 7 days. Patient was instructed to refrain from alcohol during that week. To complete her STD screening GC and Chlamydia culture was obtained today and she will stop by the lab we will draw an HIV, RPR, hepatitis B and C and notify patient in of the week if there is any abnormality to these tests. She was instructed to begin taking probiotic tablet daily to prevent recurrence. This is the second BV in the year if she has a third one we discussed probably 3-6 month treatment plan.    Greater than 50% of time was spent in counseling and coordinating care of this patient.   Time of consultation: 15   Minutes.

## 2015-10-09 LAB — GC/CHLAMYDIA PROBE AMP
CT PROBE, AMP APTIMA: NOT DETECTED
GC Probe RNA: NOT DETECTED

## 2015-10-09 LAB — HEPATITIS C ANTIBODY: HCV AB: NEGATIVE

## 2015-10-09 LAB — HEPATITIS B SURFACE ANTIGEN: Hepatitis B Surface Ag: NEGATIVE

## 2015-10-09 LAB — RPR

## 2015-10-09 LAB — HIV ANTIBODY (ROUTINE TESTING W REFLEX): HIV: NONREACTIVE

## 2015-12-06 ENCOUNTER — Ambulatory Visit (INDEPENDENT_AMBULATORY_CARE_PROVIDER_SITE_OTHER): Payer: 59 | Admitting: Gynecology

## 2015-12-06 ENCOUNTER — Encounter: Payer: Self-pay | Admitting: Gynecology

## 2015-12-06 VITALS — BP 128/84

## 2015-12-06 DIAGNOSIS — N898 Other specified noninflammatory disorders of vagina: Secondary | ICD-10-CM | POA: Diagnosis not present

## 2015-12-06 DIAGNOSIS — B9689 Other specified bacterial agents as the cause of diseases classified elsewhere: Secondary | ICD-10-CM

## 2015-12-06 DIAGNOSIS — N760 Acute vaginitis: Secondary | ICD-10-CM | POA: Diagnosis not present

## 2015-12-06 LAB — WET PREP FOR TRICH, YEAST, CLUE
TRICH WET PREP: NONE SEEN
WBC WET PREP: NONE SEEN
YEAST WET PREP: NONE SEEN

## 2015-12-06 MED ORDER — METRONIDAZOLE 500 MG PO TABS
ORAL_TABLET | ORAL | 2 refills | Status: DC
Start: 2015-12-06 — End: 2016-06-25

## 2015-12-06 MED ORDER — FLUCONAZOLE 150 MG PO TABS: 150.0000 mg | ORAL_TABLET | Freq: Once | ORAL | 0 refills | Status: AC

## 2015-12-06 NOTE — Patient Instructions (Signed)
Metronidazole injection What is this medicine? METRONIDAZOLE (me troe NI da zole) is an antiinfective. It medicine is used to treat or prevent certain kinds of bacterial and protozoal infections. It will not work for colds, flu, or other viral infections. This medicine may be used for other purposes; ask your health care provider or pharmacist if you have questions. What should I tell my health care provider before I take this medicine? They need to know if you have any of these conditions: -anemia or other blood disorders -disease of the nervous system -fungal or yeast infection -history of edema, swelling -if you drink alcohol containing drinks -liver disease -seizures -an unusual or allergic reaction to metronidazole, or other medicines, foods, dyes, or preservatives -pregnant or trying to get pregnant -breast-feeding How should I use this medicine? This medicine is for infusion into a vein. It is usually given by a health care professional in a hospital or clinic setting. If you get this medicine at home, you will be taught how to prepare and give this medicine. Use exactly as directed. Take your medicine at regular intervals. Do not take your medicine more often than directed. It is important that you put your used needles and syringes in a special sharps container. Do not put them in a trash can. If you do not have a sharps container, call your pharmacist or healthcare provider to get one. Talk to your pediatrician regarding the use of this medicine in children. Special care may be needed. Overdosage: If you think you have taken too much of this medicine contact a poison control center or emergency room at once. NOTE: This medicine is only for you. Do not share this medicine with others. What if I miss a dose? If you miss a dose, take it as soon as you can. If it is almost time for your next dose, take only that dose. Do not take double or extra doses. What may interact with this  medicine? Do not take this medicine with any of the following medications: -alcohol or any product that contains alcohol -amprenavir oral solution -cisapride -disulfiram -dofetilide -dronedarone -paclitaxel injection -pimozide -ritonavir oral solution -sertraline oral solution -sulfamethoxazole-trimethoprim injection -thioridazine -ziprasidone This medicine may also interact with the following medications: -birth control pills -cimetidine -lithium -other medicines that prolong the QT interval (cause an abnormal heart rhythm) -phenobarbital -phenytoin -warfarin This list may not describe all possible interactions. Give your health care provider a list of all the medicines, herbs, non-prescription drugs, or dietary supplements you use. Also tell them if you smoke, drink alcohol, or use illegal drugs. Some items may interact with your medicine. What should I watch for while using this medicine? Tell your doctor or health care professional if your symptoms do not improve or if they get worse. You may get drowsy or dizzy. Do not drive, use machinery, or do anything that needs mental alertness until you know how this medicine affects you. Do not stand or sit up quickly, especially if you are an older patient. This reduces the risk of dizzy or fainting spells. Avoid alcoholic drinks while you are taking this medicine and for three days afterward. Alcohol may make you feel dizzy, sick, or flushed. If you are being treated for a sexually transmitted disease, avoid sexual contact until you have finished your treatment. Your sexual partner may also need treatment. What side effects may I notice from receiving this medicine? Side effects that you should report to your doctor or health care professional as soon  as possible: -allergic reactions like skin rash or hives, swelling of the face, lips, or tongue -confusion, clumsiness -difficulty speaking -discolored or sore mouth -dizziness -fever,  infection -numbness, tingling, pain or weakness -trouble passing urine or change in the amount of urine -redness, blistering, peeling or loosening of the skin, including inside the mouth -seizures -unusual bleeding, bruising -unusually weak or tired -vaginal irritation, dryness, or discharge Side effects that usually do not require medical attention (report to your doctor or health care professional if they continue or are bothersome): -dark urine -diarrhea -headache -irritability -metallic taste -nausea -pain, irritation where injected -stomach pain, cramps -trouble sleeping This list may not describe all possible side effects. Call your doctor for medical advice about side effects. You may report side effects to FDA at 1-800-FDA-1088. Where should I keep my medicine? Keep out of the reach of children. If you are using this medicine at home, you will be instructed on how to store this medicine. Throw away any unused medicine after the expiration date on the label. NOTE: This sheet is a summary. It may not cover all possible information. If you have questions about this medicine, talk to your doctor, pharmacist, or health care provider.    2016, Elsevier/Gold Standard. (2012-09-23 14:06:56) Bacterial Vaginosis Bacterial vaginosis is a vaginal infection that occurs when the normal balance of bacteria in the vagina is disrupted. It results from an overgrowth of certain bacteria. This is the most common vaginal infection in women of childbearing age. Treatment is important to prevent complications, especially in pregnant women, as it can cause a premature delivery. CAUSES  Bacterial vaginosis is caused by an increase in harmful bacteria that are normally present in smaller amounts in the vagina. Several different kinds of bacteria can cause bacterial vaginosis. However, the reason that the condition develops is not fully understood. RISK FACTORS Certain activities or behaviors can put you  at an increased risk of developing bacterial vaginosis, including:  Having a new sex partner or multiple sex partners.  Douching.  Using an intrauterine device (IUD) for contraception. Women do not get bacterial vaginosis from toilet seats, bedding, swimming pools, or contact with objects around them. SIGNS AND SYMPTOMS  Some women with bacterial vaginosis have no signs or symptoms. Common symptoms include:  Grey vaginal discharge.  A fishlike odor with discharge, especially after sexual intercourse.  Itching or burning of the vagina and vulva.  Burning or pain with urination. DIAGNOSIS  Your health care provider will take a medical history and examine the vagina for signs of bacterial vaginosis. A sample of vaginal fluid may be taken. Your health care provider will look at this sample under a microscope to check for bacteria and abnormal cells. A vaginal pH test may also be done.  TREATMENT  Bacterial vaginosis may be treated with antibiotic medicines. These may be given in the form of a pill or a vaginal cream. A second round of antibiotics may be prescribed if the condition comes back after treatment. Because bacterial vaginosis increases your risk for sexually transmitted diseases, getting treated can help reduce your risk for chlamydia, gonorrhea, HIV, and herpes. HOME CARE INSTRUCTIONS   Only take over-the-counter or prescription medicines as directed by your health care provider.  If antibiotic medicine was prescribed, take it as directed. Make sure you finish it even if you start to feel better.  Tell all sexual partners that you have a vaginal infection. They should see their health care provider and be treated if they have  problems, such as a mild rash or itching.  During treatment, it is important that you follow these instructions:  Avoid sexual activity or use condoms correctly.  Do not douche.  Avoid alcohol as directed by your health care provider.  Avoid  breastfeeding as directed by your health care provider. SEEK MEDICAL CARE IF:   Your symptoms are not improving after 3 days of treatment.  You have increased discharge or pain.  You have a fever. MAKE SURE YOU:   Understand these instructions.  Will watch your condition.  Will get help right away if you are not doing well or get worse. FOR MORE INFORMATION  Centers for Disease Control and Prevention, Division of STD Prevention: [employer]co.zawww.cdc.gov/std American Sexual Health Association (ASHA): www.ashastd.org    This information is not intended to replace advice given to you by your health care provider. Make sure you discuss any questions you have with your health care provider.   Document Released: 02/16/2005 Document Revised: 03/09/2014 Document Reviewed: 09/28/2012 Elsevier Interactive Patient Education [employer]2016 Elsevier Inc.

## 2015-12-06 NOTE — Progress Notes (Signed)
   Patient is a 30 year old that presented to the office today complaining of vaginal odor and possible discharge. She is on the oral contraceptive pill. Review of her record indicated in the past she has been treated for bacterial vaginosis. She has been using a probiotic tablet daily. She reports normal menstrual cycles. She had a complete STD screen in August of this year. In February in August of this year she was treated for bacterial vaginosis once with Flagyl and once with Tindamax. Patient denies douching or any use of any vaginal lubricants.  Exam: Abdomen: Soft nontender no rebound or guarding Pelvic: Bartholin urethra Skene was within normal limits Vagina: Slight foul odor clear discharge Cervix no lesions or discharge Bimanual exam not done Rectal exam: Not done  Wet prep moderate clue cells and too numerous to count bacteria  Assessment/plan: Patient with recurrent bacterial vaginosis. We discussed different treatment options. She'll be started on Flagyl 500 mg twice a day for 1 week and then take 1 Flagyl tablet weekly for 4-6 months. She'll also continue no her probiotic oral tablet. Since she is going [place] I'm going to prescribe her prescription Diflucan to take as needed in the event she develops a yeast infection well vacation. Patient was reminded to refrain from alcohol during that time.

## 2016-01-08 DIAGNOSIS — Z0000 Encounter for general adult medical examination without abnormal findings: Secondary | ICD-10-CM | POA: Diagnosis not present

## 2016-01-08 DIAGNOSIS — E669 Obesity, unspecified: Secondary | ICD-10-CM | POA: Diagnosis not present

## 2016-01-08 DIAGNOSIS — L732 Hidradenitis suppurativa: Secondary | ICD-10-CM | POA: Diagnosis not present

## 2016-01-16 DIAGNOSIS — R51 Headache: Secondary | ICD-10-CM | POA: Diagnosis not present

## 2016-01-16 DIAGNOSIS — Z0000 Encounter for general adult medical examination without abnormal findings: Secondary | ICD-10-CM | POA: Diagnosis not present

## 2016-01-16 DIAGNOSIS — L709 Acne, unspecified: Secondary | ICD-10-CM | POA: Diagnosis not present

## 2016-05-22 DIAGNOSIS — H5213 Myopia, bilateral: Secondary | ICD-10-CM | POA: Diagnosis not present

## 2016-05-22 DIAGNOSIS — H52223 Regular astigmatism, bilateral: Secondary | ICD-10-CM | POA: Diagnosis not present

## 2016-06-11 DIAGNOSIS — R0981 Nasal congestion: Secondary | ICD-10-CM | POA: Diagnosis not present

## 2016-06-11 DIAGNOSIS — H66002 Acute suppurative otitis media without spontaneous rupture of ear drum, left ear: Secondary | ICD-10-CM | POA: Diagnosis not present

## 2016-06-25 ENCOUNTER — Encounter: Payer: Self-pay | Admitting: Gynecology

## 2016-06-25 ENCOUNTER — Ambulatory Visit (INDEPENDENT_AMBULATORY_CARE_PROVIDER_SITE_OTHER): Payer: 59 | Admitting: Gynecology

## 2016-06-25 ENCOUNTER — Telehealth: Payer: Self-pay

## 2016-06-25 ENCOUNTER — Telehealth: Payer: Self-pay | Admitting: *Deleted

## 2016-06-25 VITALS — BP 120/78 | Ht 67.0 in | Wt 202.0 lb

## 2016-06-25 DIAGNOSIS — N920 Excessive and frequent menstruation with regular cycle: Secondary | ICD-10-CM

## 2016-06-25 DIAGNOSIS — N946 Dysmenorrhea, unspecified: Secondary | ICD-10-CM | POA: Diagnosis not present

## 2016-06-25 DIAGNOSIS — R635 Abnormal weight gain: Secondary | ICD-10-CM | POA: Diagnosis not present

## 2016-06-25 DIAGNOSIS — Z01419 Encounter for gynecological examination (general) (routine) without abnormal findings: Secondary | ICD-10-CM | POA: Diagnosis not present

## 2016-06-25 LAB — LIPID PANEL
CHOLESTEROL: 145 mg/dL (ref <200)
HDL: 51 mg/dL (ref >50)
LDL CALC: 77 mg/dL (ref <100)
TRIGLYCERIDES: 86 mg/dL (ref <150)
Total CHOL/HDL Ratio: 2.8 Ratio (ref <5.0)
VLDL: 17 mg/dL (ref <30)

## 2016-06-25 LAB — COMPREHENSIVE METABOLIC PANEL
ALBUMIN: 4 g/dL (ref 3.6–5.1)
ALT: 10 U/L (ref 6–29)
AST: 10 U/L (ref 10–30)
Alkaline Phosphatase: 54 U/L (ref 33–115)
BILIRUBIN TOTAL: 0.2 mg/dL (ref 0.2–1.2)
BUN: 11 mg/dL (ref 7–25)
CALCIUM: 9.1 mg/dL (ref 8.6–10.2)
CHLORIDE: 105 mmol/L (ref 98–110)
CO2: 23 mmol/L (ref 20–31)
CREATININE: 0.97 mg/dL (ref 0.50–1.10)
Glucose, Bld: 88 mg/dL (ref 65–99)
Potassium: 3.9 mmol/L (ref 3.5–5.3)
SODIUM: 140 mmol/L (ref 135–146)
TOTAL PROTEIN: 6.9 g/dL (ref 6.1–8.1)

## 2016-06-25 LAB — URINALYSIS W MICROSCOPIC + REFLEX CULTURE
BACTERIA UA: NONE SEEN [HPF]
BILIRUBIN URINE: NEGATIVE
Casts: NONE SEEN [LPF]
Crystals: NONE SEEN [HPF]
GLUCOSE, UA: NEGATIVE
HGB URINE DIPSTICK: NEGATIVE
LEUKOCYTES UA: NEGATIVE
NITRITE: NEGATIVE
PH: 6.5 (ref 5.0–8.0)
Protein, ur: NEGATIVE
RBC / HPF: NONE SEEN RBC/HPF (ref <=2)
Specific Gravity, Urine: 1.022 (ref 1.001–1.035)
Squamous Epithelial / HPF: NONE SEEN [HPF] (ref <=5)
WBC UA: NONE SEEN WBC/HPF (ref <=5)
Yeast: NONE SEEN [HPF]

## 2016-06-25 LAB — CBC WITH DIFFERENTIAL/PLATELET
Basophils Absolute: 0 cells/uL (ref 0–200)
Basophils Relative: 0 %
EOS ABS: 118 {cells}/uL (ref 15–500)
Eosinophils Relative: 1 %
HEMATOCRIT: 39.7 % (ref 35.0–45.0)
HEMOGLOBIN: 13 g/dL (ref 11.7–15.5)
LYMPHS PCT: 32 %
Lymphs Abs: 3776 cells/uL (ref 850–3900)
MCH: 28 pg (ref 27.0–33.0)
MCHC: 32.7 g/dL (ref 32.0–36.0)
MCV: 85.6 fL (ref 80.0–100.0)
MONO ABS: 1062 {cells}/uL — AB (ref 200–950)
MPV: 9.1 fL (ref 7.5–12.5)
Monocytes Relative: 9 %
NEUTROS ABS: 6844 {cells}/uL (ref 1500–7800)
Neutrophils Relative %: 58 %
Platelets: 348 10*3/uL (ref 140–400)
RBC: 4.64 MIL/uL (ref 3.80–5.10)
RDW: 13.1 % (ref 11.0–15.0)
WBC: 11.8 10*3/uL — ABNORMAL HIGH (ref 3.8–10.8)

## 2016-06-25 LAB — TSH: TSH: 1.42 mIU/L

## 2016-06-25 MED ORDER — PHENTERMINE HCL 37.5 MG PO TABS: 37.5000 mg | ORAL_TABLET | Freq: Every day | ORAL | 2 refills | Status: DC

## 2016-06-25 MED ORDER — IBUPROFEN 800 MG PO TABS: 800.0000 mg | ORAL_TABLET | Freq: Three times a day (TID) | ORAL | 0 refills | Status: DC | PRN

## 2016-06-25 MED ORDER — TRANEXAMIC ACID 650 MG PO TABS: ORAL_TABLET | ORAL | 11 refills | Status: DC

## 2016-06-25 NOTE — Telephone Encounter (Signed)
Rx called in    Patient informed.

## 2016-06-25 NOTE — Telephone Encounter (Signed)
Rx sent 

## 2016-06-25 NOTE — Progress Notes (Addendum)
Laurie Reese [birth year] [id number]   History:    31 y.o.  for annual gyn exam with complaint of weight gain. Patient has been using the NuvaRing for contraception. Patient with steady partner. Patient otherwise reports normal menstrual cycles.Review of patient's record indicated that in 2015 her Pap smear demonstrated atypical squamous cells of undetermined significance with negative HPV. Follow-up Pap smear 2016 was normal. Patient has completed the HPV vaccine series in the past.  Past medical history,surgical history, family history and social history were all reviewed and documented in the EPIC chart.  Gynecologic History Patient's last menstrual period was 05/28/2016. Contraception: NuvaRing vaginal inserts Last Pap: 2016. Results were: normal Last mammogram: Not indicated. Results were: Not indicated  Obstetric History OB History  Gravida Para Term Preterm AB Living  0            SAB TAB Ectopic Multiple Live Births                    ROS: A ROS was performed and pertinent positives and negatives are included in the history.  GENERAL: No fevers or chills. HEENT: No change in vision, no earache, sore throat or sinus congestion. NECK: No pain or stiffness. CARDIOVASCULAR: No chest pain or pressure. No palpitations. PULMONARY: No shortness of breath, cough or wheeze. GASTROINTESTINAL: No abdominal pain, nausea, vomiting or diarrhea, melena or bright red blood per rectum. GENITOURINARY: No urinary frequency, urgency, hesitancy or dysuria. MUSCULOSKELETAL: No joint or muscle pain, no back pain, no recent trauma. DERMATOLOGIC: No rash, no itching, no lesions. ENDOCRINE: No polyuria, polydipsia, no heat or cold intolerance. No recent change in weight. HEMATOLOGICAL: No anemia or easy bruising or bleeding. NEUROLOGIC: No headache, seizures, numbness, tingling or weakness. PSYCHIATRIC: No depression, no loss of interest in normal activity or change in sleep pattern.      Exam: chaperone present  BP 120/78   Ht  (1.702 m)   Wt 202 lb (91.6 kg)   LMP 05/28/2016   BMI 31.64 kg/m   Body mass index is 31.64 kg/m.  General appearance : Well developed well nourished female. No acute distress HEENT: Eyes: no retinal hemorrhage or exudates,  Neck supple, trachea midline, no carotid bruits, no thyroidmegaly Lungs: Clear to auscultation, no rhonchi or wheezes, or rib retractions  Heart: Regular rate and rhythm, no murmurs or gallops Breast:Examined in sitting and supine position were symmetrical in appearance, no palpable masses or tenderness,  no skin retraction, no nipple inversion, no nipple discharge, no skin discoloration, no axillary or supraclavicular lymphadenopathy Abdomen: no palpable masses or tenderness, no rebound or guarding Extremities: no edema or skin discoloration or tenderness  Pelvic:  Bartholin, Urethra, Skene Glands: Within normal limits             Vagina: No gross lesions or discharge  Cervix: No gross lesions or discharge  Uterus  anteverted, normal size, shape and consistency, non-tender and mobile  Adnexa  Without masses or tenderness  Anus and perineum  normal   Rectovaginal  normal sphincter tone without palpated masses or tenderness             Hemoccult not indicated     Assessment/Plan:  31 y.o. female for annual exam was counseled for alternative form of contraception such as the nonhormonal IUD ParaGard T380A IUD which she is interested in. She is due for her menstrual cycle next week and we will place it at that time. For her dysmenorrhea will prescribe  Motrin 800 mg 1 by mouth 3 times a day when necessary. The following fasting screening blood work was ordered today: Comprehensive metabolic panel, fasting lipid profile, TSH, CBC, and urinalysis.  Because of patient's concerned about weight gain we had discussed 3 months treatment with appetite suppressant phentermine 37.5 mg 1 by mouth daily. The risks benefits and  pros and cons were discussed as well as literature information provided to include risk of pulmonary hypertension and cardiac valvular heart disease. She'll return to the office on a monthly basis for pulmonary and cardiac auscultation blood pressure measurements.  For her dysmenorrhea she will be prescribed Lysteda 650 mg 2 tablets 3 times a day for 5 days during her menses.  An additional 15 minutes was spent discussing weight reduction techniques as well as appetite suppressant medication and potential risk as well as alternative forms of contraception.   Ok Edwards MD, 8:28 AM 06/25/2016

## 2016-06-25 NOTE — Telephone Encounter (Signed)
Pt called back and said Lysteda 650 mg is affordable for her, would like Rx sent to pharmacy, any refills? Please advise

## 2016-06-25 NOTE — Telephone Encounter (Signed)
Laurie Reese took a call from patient. Patient said she saw you today and you were going to send her Rx for Phentermine to [facility] but it is not there.

## 2016-06-25 NOTE — Patient Instructions (Signed)
Dysmenorrhea °Menstrual cramps (dysmenorrhea) are caused by the muscles of the uterus tightening (contracting) during a menstrual period. For some women, this discomfort is merely bothersome. For others, dysmenorrhea can be severe enough to interfere with everyday activities for a few days each month. °Primary dysmenorrhea is menstrual cramps that last a couple of days when you start having menstrual periods or soon after. This often begins after a teenager starts having her period. As a woman gets older or has a baby, the cramps will usually lessen or disappear. Secondary dysmenorrhea begins later in life, lasts longer, and the pain may be stronger than primary dysmenorrhea. The pain may start before the period and last a few days after the period. °What are the causes? °Dysmenorrhea is usually caused by an underlying problem, such as: °· The tissue lining the uterus grows outside of the uterus in other areas of the body (endometriosis). °· The endometrial tissue, which normally lines the uterus, is found in or grows into the muscular walls of the uterus (adenomyosis). °· The pelvic blood vessels are engorged with blood just before the menstrual period (pelvic congestive syndrome). °· Overgrowth of cells (polyps) in the lining of the uterus or cervix. °· Falling down of the uterus (prolapse) because of loose or stretched ligaments. °· Depression. °· Bladder problems, infection, or inflammation. °· Problems with the intestine, a tumor, or irritable bowel syndrome. °· Cancer of the female organs or bladder. °· A severely tipped uterus. °· A very tight opening or closed cervix. °· Noncancerous tumors of the uterus (fibroids). °· Pelvic inflammatory disease (PID). °· Pelvic scarring (adhesions) from a previous surgery. °· Ovarian cyst. °· An intrauterine device (IUD) used for birth control. °What increases the risk? °You may be at greater risk of dysmenorrhea if: °· You are younger than age 30. °· You started puberty  early. °· You have irregular or heavy bleeding. °· You have never given birth. °· You have a family history of this problem. °· You are a smoker. °What are the signs or symptoms? °· Cramping or throbbing pain in your lower abdomen. °· Headaches. °· Lower back pain. °· Nausea or vomiting. °· Diarrhea. °· Sweating or dizziness. °· Loose stools. °How is this diagnosed? °A diagnosis is based on your history, symptoms, physical exam, diagnostic tests, or procedures. Diagnostic tests or procedures may include: °· Blood tests. °· Ultrasonography. °· An examination of the lining of the uterus (dilation and curettage, D&C). °· An examination inside your abdomen or pelvis with a scope (laparoscopy). °· X-rays. °· CT scan. °· MRI. °· An examination inside the bladder with a scope (cystoscopy). °· An examination inside the intestine or stomach with a scope (colonoscopy, gastroscopy). °How is this treated? °Treatment depends on the cause of the dysmenorrhea. Treatment may include: °· Pain medicine prescribed by your health care provider. °· Birth control pills or an IUD with progesterone hormone in it. °· Hormone replacement therapy. °· Nonsteroidal anti-inflammatory drugs (NSAIDs). These may help stop the production of prostaglandins. °· Surgery to remove adhesions, endometriosis, ovarian cyst, or fibroids. °· Removal of the uterus (hysterectomy). °· Progesterone shots to stop the menstrual period. °· Cutting the nerves on the sacrum that go to the female organs (presacral neurectomy). °· Electric current to the sacral nerves (sacral nerve stimulation). °· Antidepressant medicine. °· Psychiatric therapy, counseling, or group therapy. °· Exercise and physical therapy. °· Meditation and yoga therapy. °· Acupuncture. °Follow these instructions at home: °· Only take over-the-counter or prescription medicines as directed   by your health care provider. °· Place a heating pad or hot water bottle on your lower back or abdomen. Do not  sleep with the heating pad. °· Use aerobic exercises, walking, swimming, biking, and other exercises to help lessen the cramping. °· Massage to the lower back or abdomen may help. °· Stop smoking. °· Avoid alcohol and caffeine. °Contact a health care provider if: °· Your pain does not get better with medicine. °· You have pain with sexual intercourse. °· Your pain increases and is not controlled with medicines. °· You have abnormal vaginal bleeding with your period. °· You develop nausea or vomiting with your period that is not controlled with medicine. °Get help right away if: °You pass out. °This information is not intended to replace advice given to you by your health care provider. Make sure you discuss any questions you have with your health care provider. °Document Released: 02/16/2005 Document Revised: 07/25/2015 Document Reviewed: 08/04/2012 °Elsevier Interactive Patient Education © 2017 [employer] ° °

## 2016-06-25 NOTE — Telephone Encounter (Signed)
Call in prescription for phentermine 37.5 mg 1 by mouth daily #30 refill 2

## 2016-06-25 NOTE — Telephone Encounter (Signed)
Great ! Calling Lysteda 650 mg take 2 tablets 3 times a day for 5 days during her menses. Call in 30 tablets with 11 refills

## 2016-06-26 ENCOUNTER — Other Ambulatory Visit: Payer: Self-pay | Admitting: Gynecology

## 2016-06-26 DIAGNOSIS — D72828 Other elevated white blood cell count: Secondary | ICD-10-CM

## 2016-06-30 ENCOUNTER — Encounter: Payer: Self-pay | Admitting: Anesthesiology

## 2016-06-30 ENCOUNTER — Ambulatory Visit: Payer: 59 | Admitting: Gynecology

## 2016-06-30 ENCOUNTER — Encounter: Payer: Self-pay | Admitting: Gynecology

## 2016-06-30 ENCOUNTER — Ambulatory Visit (INDEPENDENT_AMBULATORY_CARE_PROVIDER_SITE_OTHER): Payer: 59 | Admitting: Gynecology

## 2016-06-30 VITALS — BP 130/92 | Wt 200.2 lb

## 2016-06-30 DIAGNOSIS — Z975 Presence of (intrauterine) contraceptive device: Secondary | ICD-10-CM | POA: Insufficient documentation

## 2016-06-30 DIAGNOSIS — Z30430 Encounter for insertion of intrauterine contraceptive device: Secondary | ICD-10-CM

## 2016-06-30 NOTE — Patient Instructions (Signed)

## 2016-06-30 NOTE — Progress Notes (Signed)
   Patient is a 31 year old was seen in the office for her annual exam on April 26 and had been concerned about gaining weight since she was using the NuvaRing for contraception and is here for placement of the nonhormonal ParaGard T380A IUD. As part of the overall management for her being overweight and eliminating all hormones this is the reason by: Within nonhormonal IUD. She also been started on phentermine 37.5 mg daily as an appetite suppressant as well as an exercise program nutritional support.  Patient currently menstruating                                                                   IUD procedure note       Patient presented to the office today for placement of ParaGard T380A IUD. The patient had previously been provided with literature information on this method of contraception. The risks benefits and pros and cons were discussed and all her questions were answered. She is fully aware that this form of contraception is 99% effective and is good for 10 years.  Pelvic exam: Bartholin urethra Skene glands: Within normal limits Vagina: No lesions or discharge Cervix: No lesions or discharge Uterus: Slightly retroverted position Adnexa: No masses or tenderness Rectal exam: Not done  The cervix was cleansed with Betadine solution. A single-tooth tenaculum was placed on the anterior cervical lip. The uterus sounded to 7 centimeter. The IUD was shown to the patient and inserted in a sterile fashion. The IUD string was trimmed. The single-tooth tenaculum was removed. Patient was instructed to return back to the office in one month for follow up.   Lot #[id number]

## 2016-07-14 ENCOUNTER — Encounter: Payer: Self-pay | Admitting: Gynecology

## 2016-07-15 ENCOUNTER — Other Ambulatory Visit: Payer: Self-pay | Admitting: Gynecology

## 2016-07-15 ENCOUNTER — Encounter: Payer: Self-pay | Admitting: Gynecology

## 2016-07-15 MED ORDER — MEGESTROL ACETATE 40 MG PO TABS: 40.0000 mg | ORAL_TABLET | Freq: Two times a day (BID) | ORAL | 0 refills | Status: DC

## 2016-08-03 ENCOUNTER — Ambulatory Visit (INDEPENDENT_AMBULATORY_CARE_PROVIDER_SITE_OTHER): Payer: 59 | Admitting: Gynecology

## 2016-08-03 ENCOUNTER — Encounter: Payer: Self-pay | Admitting: Gynecology

## 2016-08-03 VITALS — BP 128/86 | Ht 67.0 in | Wt 195.6 lb

## 2016-08-03 DIAGNOSIS — R635 Abnormal weight gain: Secondary | ICD-10-CM

## 2016-08-03 DIAGNOSIS — Z30431 Encounter for routine checking of intrauterine contraceptive device: Secondary | ICD-10-CM

## 2016-08-03 MED ORDER — NYSTATIN-TRIAMCINOLONE 100000-0.1 UNIT/GM-% EX CREA: 1.0000 "application " | TOPICAL_CREAM | Freq: Two times a day (BID) | CUTANEOUS | 2 refills | Status: DC

## 2016-08-03 NOTE — Progress Notes (Signed)
   Patient is a 31 year old that presented to the office to discuss 2 issues. The first one is weight management since she was started on phentermine as an appetite suppressant in April this year and second of all 1 month follow-up after placement of the nonhormonal ParaGard T380A IUD. Patient is tolerating the IUD well without any complaints.  April 26   blood pressure 120/78    weight 202 pounds    BMI 31.64 June 4    blood pressure 120/86    weight 195 pounds    BMI 30.64  Patient is tolerating the phentermine 37.5 mg daily. She has completed 1 month and has lost 7 pounds. She's also working on her diet and exercise. She was asymptomatic today.  Exam: HEENT: Unremarkable Lungs: Clear to auscultation Rogers or wheezes Heart: Regular rate and rhythm no murmurs or gallops Extremities no edema  Assessment/plan: One month status post placement nonhormonal ParaGard T380A IUD tolerating well. Weight management one month after phentermine 37.5 mg daily tolerating medicine well. Patient will continue with her strict regimen on exercise and diet and we'll returning back in 2 months for follow-up. For her inner thigh intertrigo I prescribed Mycolog cream to apply twice a day 7-10 days when necessary.

## 2016-08-18 ENCOUNTER — Encounter: Payer: Self-pay | Admitting: Gynecology

## 2016-08-19 ENCOUNTER — Other Ambulatory Visit: Payer: Self-pay | Admitting: *Deleted

## 2016-08-19 MED ORDER — CLOBETASOL PROPIONATE 0.05 % EX CREA: 1.0000 "application " | TOPICAL_CREAM | Freq: Two times a day (BID) | CUTANEOUS | 0 refills | Status: DC

## 2016-08-19 NOTE — Progress Notes (Signed)
Per email response to pt from JF, Rx sent KW

## 2016-09-16 ENCOUNTER — Ambulatory Visit (INDEPENDENT_AMBULATORY_CARE_PROVIDER_SITE_OTHER): Payer: 59 | Admitting: Gynecology

## 2016-09-16 ENCOUNTER — Encounter: Payer: Self-pay | Admitting: Gynecology

## 2016-09-16 VITALS — BP 120/78 | Ht 67.0 in | Wt 189.0 lb

## 2016-09-16 DIAGNOSIS — R635 Abnormal weight gain: Secondary | ICD-10-CM

## 2016-09-16 DIAGNOSIS — N921 Excessive and frequent menstruation with irregular cycle: Secondary | ICD-10-CM | POA: Diagnosis not present

## 2016-09-16 DIAGNOSIS — R102 Pelvic and perineal pain: Secondary | ICD-10-CM | POA: Diagnosis not present

## 2016-09-16 DIAGNOSIS — Z975 Presence of (intrauterine) contraceptive device: Secondary | ICD-10-CM

## 2016-09-16 DIAGNOSIS — Z30432 Encounter for removal of intrauterine contraceptive device: Secondary | ICD-10-CM

## 2016-09-16 MED ORDER — PHENTERMINE HCL 37.5 MG PO TABS: 37.5000 mg | ORAL_TABLET | Freq: Every day | ORAL | 2 refills | Status: DC

## 2016-09-16 MED ORDER — MEGESTROL ACETATE 40 MG PO TABS: 40.0000 mg | ORAL_TABLET | Freq: Two times a day (BID) | ORAL | 0 refills | Status: DC

## 2016-09-16 NOTE — Progress Notes (Signed)
   Patient presented to the office today to discuss 2 issues. Issue #1 doing with her being overweight she was started on phentermine 37.5 mg daily approximately 2 months ago. She is tolerating medicine well. She was started on a nonhormonal contraception chest is ParaGard T380A IUD along with regular exercise plan the hopes of losing weight. She has had dysfunctional uterine bleeding with the IUD. We have placed her on Megace 40 mg for 10 days in the past and then she was started then on Lysteda she continues to bleed. She had a normal CBC April 2018.  Exam: April 26   blood pressure 120/78    BMI 31.64   weight 202 pounds June 4    blood pressure 120/86    BMI 30.64   weight 195 pounds July 18   blood pressure 120/78    BMI 29.6      weight 189 pounds   Lungs: Clear to auscultation no rhonchi's or wheezes Heart: Regular rate and rhythm no murmurs or gallops Neck supple trachea midline no carotid bruits no thyromegaly Abdomen: Soft nontender no rebound or guarding Pelvic: Bartholin urethra Skene was within normal limits Vagina: Menstrual blood present Cervix: IUD string visualized blood was still present near the os we discussed treatment options incision was having discomfort is still bleeding the IUD was removed shown to the patient discarded Uterus: Anteverted normal size shape and consistency Adnexa: No palpable masses or tenderness Rectal exam not done  Assessment/plan: #1 patient responding well to phentermine 37.5 mg daily as an appetite suppressant. We encouraged her again importance of regular exercise appropriate nutrition. Prescription refill for 2 more months only. Patient fully where potential risk such as pulmonary hypertension cardiac valvular disease. #2 patient to return back next week we'll put a hormonal IUD such as Mirena to help with her cycle control. In the meantime I will prescribed Megace 40 mg one by mouth twice a day for 10 days.

## 2016-09-25 ENCOUNTER — Ambulatory Visit (INDEPENDENT_AMBULATORY_CARE_PROVIDER_SITE_OTHER): Payer: 59 | Admitting: Gynecology

## 2016-09-25 ENCOUNTER — Encounter: Payer: Self-pay | Admitting: Gynecology

## 2016-09-25 VITALS — BP 142/80 | Ht 67.0 in | Wt 187.0 lb

## 2016-09-25 DIAGNOSIS — Z30430 Encounter for insertion of intrauterine contraceptive device: Secondary | ICD-10-CM

## 2016-09-25 DIAGNOSIS — Z975 Presence of (intrauterine) contraceptive device: Secondary | ICD-10-CM | POA: Insufficient documentation

## 2016-09-25 NOTE — Progress Notes (Signed)
                                                                     IUD procedure note       Patient presented to the office today for placement of Mirena IUD. The patient had previously been provided with literature information on this method of contraception. The risks benefits and pros and cons were discussed and all her questions were answered. She is fully aware that this form of contraception is 99% effective and is good for 5 years.  Pelvic exam: Bartholin urethra Skene glands: Within normal limits Vagina: No lesions or discharge Cervix: No lesions or discharge Uterus: Retroverted position Adnexa: No masses or tenderness Rectal exam: Not done  The cervix was cleansed with Betadine solution. A single-tooth tenaculum was placed on the anterior cervical lip. The uterus sounded to 6-1/2 centimeter. The IUD was shown to the patient and inserted in a sterile fashion. The IUD string was trimmed. The single-tooth tenaculum was removed. Patient was instructed to return back to the office in one month for follow up.

## 2016-09-25 NOTE — Patient Instructions (Signed)

## 2016-09-29 ENCOUNTER — Encounter: Payer: Self-pay | Admitting: Anesthesiology

## 2016-10-01 ENCOUNTER — Other Ambulatory Visit: Payer: Self-pay | Admitting: Gynecology

## 2016-10-01 MED ORDER — CLOBETASOL PROPIONATE 0.05 % EX CREA: 1.0000 "application " | TOPICAL_CREAM | Freq: Two times a day (BID) | CUTANEOUS | 2 refills | Status: DC

## 2016-10-02 ENCOUNTER — Other Ambulatory Visit: Payer: Self-pay

## 2016-10-02 MED ORDER — PHENTERMINE HCL 37.5 MG PO TABS: 37.5000 mg | ORAL_TABLET | Freq: Every day | ORAL | 2 refills | Status: DC

## 2016-10-02 NOTE — Telephone Encounter (Signed)
Called into pharmacy

## 2016-10-12 ENCOUNTER — Encounter: Payer: Self-pay | Admitting: Women's Health

## 2016-10-12 ENCOUNTER — Other Ambulatory Visit: Payer: Self-pay | Admitting: Women's Health

## 2016-10-12 MED ORDER — MEGESTROL ACETATE 40 MG PO TABS: 40.0000 mg | ORAL_TABLET | Freq: Two times a day (BID) | ORAL | 0 refills | Status: DC

## 2016-10-13 ENCOUNTER — Other Ambulatory Visit: Payer: Self-pay | Admitting: Women's Health

## 2016-10-13 MED ORDER — MEGESTROL ACETATE 40 MG PO TABS: 40.0000 mg | ORAL_TABLET | Freq: Two times a day (BID) | ORAL | 0 refills | Status: DC

## 2016-10-28 ENCOUNTER — Ambulatory Visit (INDEPENDENT_AMBULATORY_CARE_PROVIDER_SITE_OTHER): Payer: 59 | Admitting: Women's Health

## 2016-10-28 ENCOUNTER — Encounter: Payer: Self-pay | Admitting: Women's Health

## 2016-10-28 VITALS — BP 130/80 | Ht 67.0 in | Wt 187.0 lb

## 2016-10-28 DIAGNOSIS — Z30431 Encounter for routine checking of intrauterine contraceptive device: Secondary | ICD-10-CM | POA: Diagnosis not present

## 2016-10-28 NOTE — Progress Notes (Signed)
Presents for Mirena IUD checkup. 09/25/2016 Mirena IUD placed. Bled for 2 weeks was prescribed Megace and bleeding has stopped. Has had problems with irregular bleeding and weight gain in the past. Has completed 4 months of phentermine and has lost about 15 pounds requests refill. Denies cramping, vaginal discharge, urinary symptoms or abdominal pain. Same partner.  Exam: Appears well. External genitalia within normal limits, speculum exam cervix without visible lesion, IUD strings in os. Bimanual no CMT or adnexal tenderness.  Mirena IUD  Plan: Reviewed best not to continue on phentermine Rx not given. Continue exercise, low carb/lower calorie diet encouraged, Weight [employer]chers. Reviewed strings are short no need to trim.

## 2016-12-07 DIAGNOSIS — L732 Hidradenitis suppurativa: Secondary | ICD-10-CM | POA: Diagnosis not present

## 2017-01-07 ENCOUNTER — Encounter: Payer: Self-pay | Admitting: Obstetrics & Gynecology

## 2017-01-07 ENCOUNTER — Ambulatory Visit (INDEPENDENT_AMBULATORY_CARE_PROVIDER_SITE_OTHER): Payer: 59 | Admitting: Obstetrics & Gynecology

## 2017-01-07 VITALS — BP 134/86

## 2017-01-07 DIAGNOSIS — N949 Unspecified condition associated with female genital organs and menstrual cycle: Secondary | ICD-10-CM | POA: Diagnosis not present

## 2017-01-07 DIAGNOSIS — B3731 Acute candidiasis of vulva and vagina: Secondary | ICD-10-CM

## 2017-01-07 DIAGNOSIS — T8332XA Displacement of intrauterine contraceptive device, initial encounter: Secondary | ICD-10-CM

## 2017-01-07 DIAGNOSIS — N762 Acute vulvitis: Secondary | ICD-10-CM | POA: Diagnosis not present

## 2017-01-07 DIAGNOSIS — N9489 Other specified conditions associated with female genital organs and menstrual cycle: Secondary | ICD-10-CM

## 2017-01-07 DIAGNOSIS — B373 Candidiasis of vulva and vagina: Secondary | ICD-10-CM | POA: Diagnosis not present

## 2017-01-07 LAB — WET PREP FOR TRICH, YEAST, CLUE

## 2017-01-07 MED ORDER — FLUCONAZOLE 150 MG PO TABS: 150.0000 mg | ORAL_TABLET | Freq: Every day | ORAL | 1 refills | Status: AC

## 2017-01-07 NOTE — Progress Notes (Addendum)
Laurie Reese 1986/01/28 [id number]        31 y.o.  G0 Boyfriend  RP:  Vulvar irritation, tenderness and redness  HPI:  Had IC with a new lubricant last week-end.  Since then, vulva has been irritated, tender and red.  Mirena IUD x 09/25/2016.  Past medical history,surgical history, problem list, medications, allergies, family history and social history were all reviewed and documented in the EPIC chart.  Directed ROS with pertinent positives and negatives documented in the history of present illness/assessment and plan.  Exam:  There were no vitals filed for this visit. General appearance:  Normal  Gyn exam:  Vulva generalized erythema.  Speculum:  Cervix normal.  IUD strings not visible.  Vagina normal.  Secretions wnl.  Wet prep and Gono-Chlam done.  Bimanual exam:  RV uterus, normal volume, NT.  No adnexal mass, NT.  U/A Moderate bacteria, few yeasts, otherwise negative.   Assessment/Plan:  31 y.o. G0P0   1. Acute vulvitis Probable contact dermatitis associated with new lubricant.  Hydrocortisone 1% cream or ointment OTC recommended.  Usage reviewed.  Warm soaking as needed. - WET PREP FOR TRICH, YEAST, CLUE - C. trachomatis/N. gonorrhoeae RNA  2. Yeast vaginitis Yeast vaginitis on Wet prep.  Will treat with Fluconazole 150 mg 1 tab PO daily x 3. - WET PREP FOR TRICH, YEAST, CLUE  3. Intrauterine contraceptive device threads lost, initial encounter IUD strings not visible, not felt.  F/U Pelvic US to confirm IU location of IUD. - US Transvaginal Non-OB; Future  Counseling on above issues >50% x 15 minutes  Genia Del MD, 11:10 AM 01/07/2017

## 2017-01-08 LAB — C. TRACHOMATIS/N. GONORRHOEAE RNA
C. trachomatis RNA, TMA: NOT DETECTED
N. gonorrhoeae RNA, TMA: NOT DETECTED

## 2017-01-09 LAB — URINALYSIS W MICROSCOPIC + REFLEX CULTURE
Bilirubin Urine: NEGATIVE
Glucose, UA: NEGATIVE
HGB URINE DIPSTICK: NEGATIVE
HYALINE CAST: NONE SEEN /LPF
LEUKOCYTE ESTERASE: NEGATIVE
Nitrites, Initial: NEGATIVE
PH: 5.5 (ref 5.0–8.0)
PROTEIN: NEGATIVE
RBC / HPF: NONE SEEN /HPF (ref 0–2)
Specific Gravity, Urine: 1.025 (ref 1.001–1.03)
WBC, UA: NONE SEEN /HPF (ref 0–5)

## 2017-01-09 LAB — URINE CULTURE
MICRO NUMBER:: 81264144
SPECIMEN QUALITY: ADEQUATE

## 2017-01-09 LAB — CULTURE INDICATED

## 2017-01-10 ENCOUNTER — Encounter: Payer: Self-pay | Admitting: Obstetrics & Gynecology

## 2017-01-10 NOTE — Patient Instructions (Signed)
1. Acute vulvitis Probable contact dermatitis associated with new lubricant.  Hydrocortisone 1% cream or ointment OTC recommended.  Usage reviewed.  Warm soaking as needed. - WET PREP FOR TRICH, YEAST, CLUE - C. trachomatis/N. gonorrhoeae RNA  2. Yeast vaginitis Yeast vaginitis on Wet prep.  Will treat with Fluconazole 150 mg 1 tab PO daily x 3. - WET PREP FOR TRICH, YEAST, CLUE  3. Intrauterine contraceptive device threads lost, initial encounter IUD strings not visible, not felt.  F/U Pelvic US to confirm IU location of IUD. - US Transvaginal Non-OB; Future  Laurie Reese, it was a pleasure meeting you today!  I will see you again soon for the pelvic ultrasound.   Vaginal Yeast infection, Adult Vaginal yeast infection is a condition that causes soreness, swelling, and redness (inflammation) of the vagina. It also causes vaginal discharge. This is a common condition. Some women get this infection frequently. What are the causes? This condition is caused by a change in the normal balance of the yeast (candida) and bacteria that live in the vagina. This change causes an overgrowth of yeast, which causes the inflammation. What increases the risk? This condition is more likely to develop in:  Women who take antibiotic medicines.  Women who have diabetes.  Women who take birth control pills.  Women who are pregnant.  Women who douche often.  Women who have a weak defense (immune) system.  Women who have been taking steroid medicines for a long time.  Women who frequently wear tight clothing.  What are the signs or symptoms? Symptoms of this condition include:  White, thick vaginal discharge.  Swelling, itching, redness, and irritation of the vagina. The lips of the vagina (vulva) may be affected as well.  Pain or a burning feeling while urinating.  Pain during sex.  How is this diagnosed? This condition is diagnosed with a medical history and physical exam. This will include  a pelvic exam. Your health care provider will examine a sample of your vaginal discharge under a microscope. Your health care provider may send this sample for testing to confirm the diagnosis. How is this treated? This condition is treated with medicine. Medicines may be over-the-counter or prescription. You may be told to use one or more of the following:  Medicine that is taken orally.  Medicine that is applied as a cream.  Medicine that is inserted directly into the vagina (suppository).  Follow these instructions at home:  Take or apply over-the-counter and prescription medicines only as told by your health care provider.  Do not have sex until your health care provider has approved. Tell your sex partner that you have a yeast infection. That person should go to his or her health care provider if he or she develops symptoms.  Do not wear tight clothes, such as pantyhose or tight pants.  Avoid using tampons until your health care provider approves.  Eat more yogurt. This may help to keep your yeast infection from returning.  Try taking a sitz bath to help with discomfort. This is a warm water bath that is taken while you are sitting down. The water should only come up to your hips and should cover your buttocks. Do this 3-4 times per day or as told by your health care provider.  Do not douche.  Wear breathable, cotton underwear.  If you have diabetes, keep your blood sugar levels under control. Contact a health care provider if:  You have a fever.  Your symptoms go away and  then return.  Your symptoms do not get better with treatment.  Your symptoms get worse.  You have new symptoms.  You develop blisters in or around your vagina.  You have blood coming from your vagina and it is not your menstrual period.  You develop pain in your abdomen. This information is not intended to replace advice given to you by your health care provider. Make sure you discuss any  questions you have with your health care provider. Document Released: 11/26/2004 Document Revised: 07/31/2015 Document Reviewed: 08/20/2014 Elsevier Interactive Patient Education  2018 [employer]

## 2017-01-11 ENCOUNTER — Encounter: Payer: Self-pay | Admitting: *Deleted

## 2017-01-15 DIAGNOSIS — L709 Acne, unspecified: Secondary | ICD-10-CM | POA: Diagnosis not present

## 2017-01-15 DIAGNOSIS — L732 Hidradenitis suppurativa: Secondary | ICD-10-CM | POA: Diagnosis not present

## 2017-01-20 DIAGNOSIS — Z0000 Encounter for general adult medical examination without abnormal findings: Secondary | ICD-10-CM | POA: Diagnosis not present

## 2017-01-27 ENCOUNTER — Other Ambulatory Visit: Payer: 59

## 2017-01-27 ENCOUNTER — Ambulatory Visit: Payer: 59 | Admitting: Obstetrics & Gynecology

## 2017-01-28 DIAGNOSIS — Z0000 Encounter for general adult medical examination without abnormal findings: Secondary | ICD-10-CM | POA: Diagnosis not present

## 2017-01-28 DIAGNOSIS — E663 Overweight: Secondary | ICD-10-CM | POA: Diagnosis not present

## 2017-03-05 DIAGNOSIS — L709 Acne, unspecified: Secondary | ICD-10-CM | POA: Diagnosis not present

## 2017-03-05 DIAGNOSIS — L732 Hidradenitis suppurativa: Secondary | ICD-10-CM | POA: Diagnosis not present

## 2017-04-23 DIAGNOSIS — L732 Hidradenitis suppurativa: Secondary | ICD-10-CM | POA: Diagnosis not present

## 2017-04-23 DIAGNOSIS — L709 Acne, unspecified: Secondary | ICD-10-CM | POA: Diagnosis not present

## 2017-05-03 DIAGNOSIS — E669 Obesity, unspecified: Secondary | ICD-10-CM | POA: Diagnosis not present

## 2017-05-14 DIAGNOSIS — M7581 Other shoulder lesions, right shoulder: Secondary | ICD-10-CM | POA: Diagnosis not present

## 2017-06-07 ENCOUNTER — Encounter: Payer: Self-pay | Admitting: Obstetrics & Gynecology

## 2017-06-08 ENCOUNTER — Ambulatory Visit: Payer: 59 | Admitting: Obstetrics & Gynecology

## 2017-06-08 ENCOUNTER — Encounter: Payer: Self-pay | Admitting: Obstetrics & Gynecology

## 2017-06-08 VITALS — BP 132/78

## 2017-06-08 DIAGNOSIS — N898 Other specified noninflammatory disorders of vagina: Secondary | ICD-10-CM

## 2017-06-08 LAB — WET PREP FOR TRICH, YEAST, CLUE

## 2017-06-08 MED ORDER — TINIDAZOLE 500 MG PO TABS: 2.0000 g | ORAL_TABLET | Freq: Every day | ORAL | 0 refills | Status: AC

## 2017-06-08 MED ORDER — FLUCONAZOLE 150 MG PO TABS: 150.0000 mg | ORAL_TABLET | Freq: Every day | ORAL | 0 refills | Status: AC

## 2017-06-08 NOTE — Progress Notes (Signed)
Laurie Reese 28-Oct-1985 [id number]        32 y.o.  G0P0 stable boyfriend  RP: Vaginal discharge with odor for 3 days  HPI: Complaining of vaginal discharge with odor for 3 days.  No abnormal bleeding.  No vaginal itching.  No pelvic pain.  Patient is doing well with Mirena IUD x July 2018.   OB History  Gravida Para Term Preterm AB Living  0            SAB TAB Ectopic Multiple Live Births               Past medical history,surgical history, problem list, medications, allergies, family history and social history were all reviewed and documented in the EPIC chart.   Directed ROS with pertinent positives and negatives documented in the history of present illness/assessment and plan.  Exam:  Vitals:   06/08/17 1003  BP: 132/78   General appearance:  Normal  Abdomen: Normal  Gynecologic exam: Vulva normal.  Speculum: Cervix/vagina normal.  Increased vaginal discharge.  Wet prep done.  Wet prep: Yeasts present and clue cells.   Assessment/Plan:  32 y.o. G0P0   1. Vaginal odor Bacterial vaginosis confirmed by wet prep.  Will treat with tinidazole.  Usage reviewed with patient and prescription sent to pharmacy.  -WET PREP FOR TRICH, YEAST, CLUE  2. Vaginal discharge Yeast vaginitis also present.  Will treat with fluconazole 1 tablet per mouth daily for 3 days after tinidazole.  Usage reviewed and prescription sent to pharmacy.  Recommend probiotic for prevention of recurrence. - WET PREP FOR TRICH, YEAST, CLUE  Orders - tinidazole (TINDAMAX) 500 MG tablet; Take 4 tablets (2,000 mg total) by mouth daily for 2 days. - fluconazole (DIFLUCAN) 150 MG tablet; Take 1 tablet (150 mg total) by mouth daily for 3 days. Start after Tinidazole treatment  Counseling on above issues and coordination of care more than 50% of the time for 15 minutes.  Genia Del MD, 10:33 AM 06/08/2017

## 2017-06-13 ENCOUNTER — Encounter: Payer: Self-pay | Admitting: Obstetrics & Gynecology

## 2017-06-13 NOTE — Patient Instructions (Signed)
1. Vaginal odor Bacterial vaginosis confirmed by wet prep.  Will treat with tinidazole.  Usage reviewed with patient and prescription sent to pharmacy.  -WET PREP FOR TRICH, YEAST, CLUE  2. Vaginal discharge Yeast vaginitis also present.  Will treat with fluconazole 1 tablet per mouth daily for 3 days after tinidazole.  Usage reviewed and prescription sent to pharmacy.  Recommend probiotic for prevention of recurrence. - WET PREP FOR TRICH, YEAST, CLUE  Orders - tinidazole (TINDAMAX) 500 MG tablet; Take 4 tablets (2,000 mg total) by mouth daily for 2 days. - fluconazole (DIFLUCAN) 150 MG tablet; Take 1 tablet (150 mg total) by mouth daily for 3 days. Start after Tinidazole treatment  Laurie Reese, good seeing you today!   Bacterial Vaginosis Bacterial vaginosis is a vaginal infection that occurs when the normal balance of bacteria in the vagina is disrupted. It results from an overgrowth of certain bacteria. This is the most common vaginal infection among women ages 15-44. Because bacterial vaginosis increases your risk for STIs (sexually transmitted infections), getting treated can help reduce your risk for chlamydia, gonorrhea, herpes, and HIV (human immunodeficiency virus). Treatment is also important for preventing complications in pregnant women, because this condition can cause an early (premature) delivery. What are the causes? This condition is caused by an increase in harmful bacteria that are normally present in small amounts in the vagina. However, the reason that the condition develops is not fully understood. What increases the risk? The following factors may make you more likely to develop this condition:  Having a new sexual partner or multiple sexual partners.  Having unprotected sex.  Douching.  Having an intrauterine device (IUD).  Smoking.  Drug and alcohol abuse.  Taking certain antibiotic medicines.  Being pregnant.  You cannot get bacterial vaginosis from  toilet seats, bedding, swimming pools, or contact with objects around you. What are the signs or symptoms? Symptoms of this condition include:  Grey or white vaginal discharge. The discharge can also be watery or foamy.  A fish-like odor with discharge, especially after sexual intercourse or during menstruation.  Itching in and around the vagina.  Burning or pain with urination.  Some women with bacterial vaginosis have no signs or symptoms. How is this diagnosed? This condition is diagnosed based on:  Your medical history.  A physical exam of the vagina.  Testing a sample of vaginal fluid under a microscope to look for a large amount of bad bacteria or abnormal cells. Your health care provider may use a cotton swab or a small wooden spatula to collect the sample.  How is this treated? This condition is treated with antibiotics. These may be given as a pill, a vaginal cream, or a medicine that is put into the vagina (suppository). If the condition comes back after treatment, a second round of antibiotics may be needed. Follow these instructions at home: Medicines  Take over-the-counter and prescription medicines only as told by your health care provider.  Take or use your antibiotic as told by your health care provider. Do not stop taking or using the antibiotic even if you start to feel better. General instructions  If you have a female sexual partner, tell her that you have a vaginal infection. She should see her health care provider and be treated if she has symptoms. If you have a female sexual partner, he does not need treatment.  During treatment: ? Avoid sexual activity until you finish treatment. ? Do not douche. ? Avoid alcohol as directed  by your health care provider. ? Avoid breastfeeding as directed by your health care provider.  Drink enough water and fluids to keep your urine clear or pale yellow.  Keep the area around your vagina and rectum clean. ? Wash the  area daily with warm water. ? Wipe yourself from front to back after using the toilet.  Keep all follow-up visits as told by your health care provider. This is important. How is this prevented?  Do not douche.  Wash the outside of your vagina with warm water only.  Use protection when having sex. This includes latex condoms and dental dams.  Limit how many sexual partners you have. To help prevent bacterial vaginosis, it is best to have sex with just one partner (monogamous).  Make sure you and your sexual partner are tested for STIs.  Wear cotton or cotton-lined underwear.  Avoid wearing tight pants and pantyhose, especially during summer.  Limit the amount of alcohol that you drink.  Do not use any products that contain nicotine or tobacco, such as cigarettes and e-cigarettes. If you need help quitting, ask your health care provider.  Do not use illegal drugs. Where to find more information:  Centers for Disease Control and Prevention: SolutionApps.co.zawww.cdc.gov/std  American Sexual Health Association (ASHA): www.ashastd.org  [place] Department of Health and Health and safety inspectorHuman Services, Office on Women's Health: ConventionalMedicines.siwww.womenshealth.gov/ or http://www.anderson-williamson.info/https://www.womenshealth.gov/a-z-topics/bacterial-vaginosis Contact a health care provider if:  Your symptoms do not improve, even after treatment.  You have more discharge or pain when urinating.  You have a fever.  You have pain in your abdomen.  You have pain during sex.  You have vaginal bleeding between periods. Summary  Bacterial vaginosis is a vaginal infection that occurs when the normal balance of bacteria in the vagina is disrupted.  Because bacterial vaginosis increases your risk for STIs (sexually transmitted infections), getting treated can help reduce your risk for chlamydia, gonorrhea, herpes, and HIV (human immunodeficiency virus). Treatment is also important for preventing complications in pregnant women, because the condition can cause an early  (premature) delivery.  This condition is treated with antibiotic medicines. These may be given as a pill, a vaginal cream, or a medicine that is put into the vagina (suppository). This information is not intended to replace advice given to you by your health care provider. Make sure you discuss any questions you have with your health care provider. Document Released: 02/16/2005 Document Revised: 06/22/2016 Document Reviewed: 11/02/2015 Elsevier Interactive Patient Education  2018 [employer]   Vaginal Yeast infection, Adult Vaginal yeast infection is a condition that causes soreness, swelling, and redness (inflammation) of the vagina. It also causes vaginal discharge. This is a common condition. Some women get this infection frequently. What are the causes? This condition is caused by a change in the normal balance of the yeast (candida) and bacteria that live in the vagina. This change causes an overgrowth of yeast, which causes the inflammation. What increases the risk? This condition is more likely to develop in:  Women who take antibiotic medicines.  Women who have diabetes.  Women who take birth control pills.  Women who are pregnant.  Women who douche often.  Women who have a weak defense (immune) system.  Women who have been taking steroid medicines for a long time.  Women who frequently wear tight clothing.  What are the signs or symptoms? Symptoms of this condition include:  White, thick vaginal discharge.  Swelling, itching, redness, and irritation of the vagina. The lips of the vagina (  vulva) may be affected as well.  Pain or a burning feeling while urinating.  Pain during sex.  How is this diagnosed? This condition is diagnosed with a medical history and physical exam. This will include a pelvic exam. Your health care provider will examine a sample of your vaginal discharge under a microscope. Your health care provider may send this sample for testing to  confirm the diagnosis. How is this treated? This condition is treated with medicine. Medicines may be over-the-counter or prescription. You may be told to use one or more of the following:  Medicine that is taken orally.  Medicine that is applied as a cream.  Medicine that is inserted directly into the vagina (suppository).  Follow these instructions at home:  Take or apply over-the-counter and prescription medicines only as told by your health care provider.  Do not have sex until your health care provider has approved. Tell your sex partner that you have a yeast infection. That person should go to his or her health care provider if he or she develops symptoms.  Do not wear tight clothes, such as pantyhose or tight pants.  Avoid using tampons until your health care provider approves.  Eat more yogurt. This may help to keep your yeast infection from returning.  Try taking a sitz bath to help with discomfort. This is a warm water bath that is taken while you are sitting down. The water should only come up to your hips and should cover your buttocks. Do this 3-4 times per day or as told by your health care provider.  Do not douche.  Wear breathable, cotton underwear.  If you have diabetes, keep your blood sugar levels under control. Contact a health care provider if:  You have a fever.  Your symptoms go away and then return.  Your symptoms do not get better with treatment.  Your symptoms get worse.  You have new symptoms.  You develop blisters in or around your vagina.  You have blood coming from your vagina and it is not your menstrual period.  You develop pain in your abdomen. This information is not intended to replace advice given to you by your health care provider. Make sure you discuss any questions you have with your health care provider. Document Released: 11/26/2004 Document Revised: 07/31/2015 Document Reviewed: 08/20/2014 Elsevier Interactive Patient  Education  2018 [employer].

## 2017-06-29 ENCOUNTER — Encounter: Payer: 59 | Admitting: Obstetrics & Gynecology

## 2017-07-02 ENCOUNTER — Encounter: Payer: Self-pay | Admitting: Obstetrics & Gynecology

## 2017-07-02 ENCOUNTER — Ambulatory Visit (INDEPENDENT_AMBULATORY_CARE_PROVIDER_SITE_OTHER): Payer: 59 | Admitting: Obstetrics & Gynecology

## 2017-07-02 VITALS — BP 124/80 | Ht 66.0 in | Wt 186.0 lb

## 2017-07-02 DIAGNOSIS — Z30431 Encounter for routine checking of intrauterine contraceptive device: Secondary | ICD-10-CM

## 2017-07-02 DIAGNOSIS — R87612 Low grade squamous intraepithelial lesion on cytologic smear of cervix (LGSIL): Secondary | ICD-10-CM | POA: Diagnosis not present

## 2017-07-02 DIAGNOSIS — N6012 Diffuse cystic mastopathy of left breast: Secondary | ICD-10-CM

## 2017-07-02 DIAGNOSIS — Z01419 Encounter for gynecological examination (general) (routine) without abnormal findings: Secondary | ICD-10-CM

## 2017-07-02 DIAGNOSIS — N6011 Diffuse cystic mastopathy of right breast: Secondary | ICD-10-CM

## 2017-07-02 NOTE — Progress Notes (Signed)
Laurie Reese 10-31-1985 [id number]   History:    32 y.o. Stable boyfriend x 1 year.  Works at the Pediatric office downstairs.  RP:  Established patient presenting for annual gyn exam   HPI: Mirena IUD x 08/2016.  No breakthrough bleeding.  No pelvic pain.  No pain with intercourse.  Normal vaginal secretions.  Urine and bowel movements normal.  Breasts normal.  Body mass index 30.02.  Health labs with family physician.  Past medical history,surgical history, family history and social history were all reviewed and documented in the EPIC chart.  Gynecologic History No LMP recorded. (Menstrual status: IUD). Contraception: Mirena IUD x 08/2016 Last Pap: 06/2014. Results were: Negative/HPV HR neg Last mammogram: Never Bone Density: Never Colonoscopy: Never  Obstetric History OB History  Gravida Para Term Preterm AB Living  0            SAB TAB Ectopic Multiple Live Births                ROS: A ROS was performed and pertinent positives and negatives are included in the history.  GENERAL: No fevers or chills. HEENT: No change in vision, no earache, sore throat or sinus congestion. NECK: No pain or stiffness. CARDIOVASCULAR: No chest pain or pressure. No palpitations. PULMONARY: No shortness of breath, cough or wheeze. GASTROINTESTINAL: No abdominal pain, nausea, vomiting or diarrhea, melena or bright red blood per rectum. GENITOURINARY: No urinary frequency, urgency, hesitancy or dysuria. MUSCULOSKELETAL: No joint or muscle pain, no back pain, no recent trauma. DERMATOLOGIC: No rash, no itching, no lesions. ENDOCRINE: No polyuria, polydipsia, no heat or cold intolerance. No recent change in weight. HEMATOLOGICAL: No anemia or easy bruising or bleeding. NEUROLOGIC: No headache, seizures, numbness, tingling or weakness. PSYCHIATRIC: No depression, no loss of interest in normal activity or change in sleep pattern.     Exam:   Ht  (1.676 m)   Wt 186 lb (84.4 kg)   BMI 30.02 kg/m     Body mass index is 30.02 kg/m.  General appearance : Well developed well nourished female. No acute distress HEENT: Eyes: no retinal hemorrhage or exudates,  Neck supple, trachea midline, no carotid bruits, no thyroidmegaly Lungs: Clear to auscultation, no rhonchi or wheezes, or rib retractions  Heart: Regular rate and rhythm, no murmurs or gallops Breast:Examined in sitting and supine position were symmetrical in appearance, no palpable masses or tenderness, but very dense/fibrocystic bilaterally at lower breasts,  no skin retraction, no nipple inversion, no nipple discharge, no skin discoloration, no axillary or supraclavicular lymphadenopathy Abdomen: no palpable masses or tenderness, no rebound or guarding Extremities: no edema or skin discoloration or tenderness  Pelvic: Vulva: Normal             Vagina: No gross lesions or discharge  Cervix: No gross lesions or discharge.  IUD strings seen.  Pap reflex done.  Uterus  AV, normal size, shape and consistency, non-tender and mobile  Adnexa  Without masses or tenderness  Anus: Normal   Assessment/Plan:  32 y.o. female for annual exam   1. Encounter for routine gynecological examination with Papanicolaou smear of cervix Normal gynecologic exam.  Pap reflex done.  Breast exam showing probable bilateral fibrocystic disease.  Health labs with family physician.  Healthy nutrition and regular physical activity recommended.  2. Encounter for routine checking of intrauterine contraceptive device (IUD) Mirena IUD x 08/2016 well tolerated and in good position.  3. Fibrocystic breast changes of both breasts Bilateral  breast densities at the inferior aspect of both breasts.  Probably fibrocystic breast disease, but rule out other pathologies.  Refer for Bilateral Dx Mammo/US.  Patient agrees with plan.  Genia Del MD, 2:32 PM 07/02/2017

## 2017-07-03 ENCOUNTER — Encounter: Payer: Self-pay | Admitting: Obstetrics & Gynecology

## 2017-07-03 NOTE — Patient Instructions (Signed)
1. Encounter for routine gynecological examination with Papanicolaou smear of cervix Normal gynecologic exam.  Pap reflex done.  Breast exam showing probable bilateral fibrocystic disease.  Health labs with family physician.  Healthy nutrition and regular physical activity recommended.  2. Encounter for routine checking of intrauterine contraceptive device (IUD) Mirena IUD x 08/2016 well tolerated and in good position.  3. Fibrocystic breast changes of both breasts Bilateral breast densities at the inferior aspect of both breasts.  Probably fibrocystic breast disease, but rule out other pathologies.  Refer for Bilateral Dx Mammo/US.  Patient agrees with plan.  Shirl, it was a pleasure meeting you today.  I will inform you of your results as soon as they are available.   Fibrocystic Breast Changes Fibrocystic breast changes are changes in breast tissue that can cause breasts to become swollen, lumpy, or painful. This can happen due to buildup of scar-like tissue (fibrous tissue) or the forming of fluid-filled lumps (cysts) in the breast. This is a common condition, and it is not cancerous (is benign). The exact cause is not known, but it seems to occur when women go through hormonal changes during their menstrual cycle. Fibrocystic breast changes can affect one or both breasts. What are the causes? The exact cause of fibrocystic breast changes is not known. However, this condition:  May be related to the female hormones estrogen and progesterone.  May be influenced by family traits that get passed from parent to child (genetics).  What are the signs or symptoms? Symptoms of this condition may affect one or both breasts, and may include:  Tenderness, mild discomfort, or pain.  Swelling.  Rope-like tissue that can be felt when touching the breast.  Lumps in one or both breasts.  Changes in breast size. Breasts may get larger before the menstrual period and smaller after the menstrual  period.  Green or dark brown discharge from the nipple.  Symptoms are usually worse before menstrual periods start, and they get better toward the end of menstrual periods. How is this diagnosed? This condition is diagnosed based on your medical history and a physical exam of your breasts. You may also have tests, such as:  A breast X-ray (mammogram).  Ultrasound of your breasts.  MRI.  Removal of a breast tissue sample for testing (breast biopsy). This may be done if your health care provider thinks that something else may be causing changes in your breasts.  How is this treated? Often, treatment is not needed for this condition. In some cases, treatment may include:  Taking over-the-counter pain relievers to help lessen pain or discomfort.  Limiting or avoiding caffeine. Foods and beverages that contain caffeine include chocolate, soda, coffee, and tea.  Reducing sugar and fat in your diet.  Your health care provider may also recommend:  A procedure to remove fluid from a cyst that is causing pain (fine needle aspiration).  Surgery to remove a cyst that is large or tender or does not go away.  Follow these instructions at home:  Examine your breasts after every menstrual period. If you do not have menstrual periods, check your breasts on the first day of every month. Feel for changes in your breasts, such as: ? More tenderness. ? A new growth. ? A change in size. ? A change in an existing lump.  Take over-the-counter and prescription medicines only as told by your health care provider.  Wear a well-fitted support or sports bra, especially when exercising.  Decrease or avoid caffeine, fat,  and sugar in your diet as directed by your health care provider. Contact a health care provider if:  You have fluid leaking from your nipple, especially if it is bloody.  You have new lumps or bumps in your breast.  Your breast becomes enlarged, red, and painful.  You have areas  of your breast that pucker inward.  Your nipple appears flat or indented. Get help right away if:  You have redness of your breast and the redness is spreading. Summary  Fibrocystic breast changes are changes in breast tissue that can cause breasts to become swollen, lumpy, or painful.  This condition may be related to the female hormones estrogen and progesterone.  With this condition, it is important to examine your breasts after every menstrual period. If you do not have menstrual periods, check your breasts on the first day of every month. This information is not intended to replace advice given to you by your health care provider. Make sure you discuss any questions you have with your health care provider. Document Released: 12/03/2005 Document Revised: 10/29/2015 Document Reviewed: 10/16/2015 Elsevier Interactive Patient Education  2017 [employer].

## 2017-07-05 ENCOUNTER — Encounter: Payer: Self-pay | Admitting: *Deleted

## 2017-07-05 ENCOUNTER — Telehealth: Payer: Self-pay | Admitting: *Deleted

## 2017-07-05 DIAGNOSIS — N6011 Diffuse cystic mastopathy of right breast: Secondary | ICD-10-CM

## 2017-07-05 DIAGNOSIS — N6012 Diffuse cystic mastopathy of left breast: Principal | ICD-10-CM

## 2017-07-05 LAB — PAP IG W/ RFLX HPV ASCU

## 2017-07-05 NOTE — Telephone Encounter (Signed)
-----   Message from Genia Del, MD sent at 07/02/2017  2:54 PM EDT ----- Regarding: Refer for Bilateral Dx Mammo/US Bilateral Fibrocystic/ increased density in lower breasts.

## 2017-07-05 NOTE — Telephone Encounter (Signed)
Patient scheduled on 07/07/17 @ 7:10am at breast center, sent patient my chart message.

## 2017-07-07 ENCOUNTER — Encounter: Payer: Self-pay | Admitting: Obstetrics & Gynecology

## 2017-07-07 ENCOUNTER — Ambulatory Visit
Admission: RE | Admit: 2017-07-07 | Discharge: 2017-07-07 | Disposition: A | Discharge status: Home / Self Care | Payer: 59 | Source: Ambulatory Visit | Attending: Obstetrics & Gynecology | Admitting: Obstetrics & Gynecology

## 2017-07-07 DIAGNOSIS — N6012 Diffuse cystic mastopathy of left breast: Principal | ICD-10-CM

## 2017-07-07 DIAGNOSIS — N6011 Diffuse cystic mastopathy of right breast: Secondary | ICD-10-CM

## 2017-07-07 DIAGNOSIS — N6489 Other specified disorders of breast: Secondary | ICD-10-CM | POA: Diagnosis not present

## 2017-07-09 DIAGNOSIS — L732 Hidradenitis suppurativa: Secondary | ICD-10-CM | POA: Diagnosis not present

## 2017-07-09 DIAGNOSIS — L723 Sebaceous cyst: Secondary | ICD-10-CM | POA: Diagnosis not present

## 2017-08-11 DIAGNOSIS — H5213 Myopia, bilateral: Secondary | ICD-10-CM | POA: Diagnosis not present

## 2017-08-12 ENCOUNTER — Ambulatory Visit: Payer: 59 | Admitting: Obstetrics & Gynecology

## 2017-08-12 ENCOUNTER — Encounter: Payer: Self-pay | Admitting: Obstetrics & Gynecology

## 2017-08-12 VITALS — BP 128/80

## 2017-08-12 DIAGNOSIS — N870 Mild cervical dysplasia: Secondary | ICD-10-CM | POA: Diagnosis not present

## 2017-08-12 DIAGNOSIS — Z113 Encounter for screening for infections with a predominantly sexual mode of transmission: Secondary | ICD-10-CM | POA: Diagnosis not present

## 2017-08-12 DIAGNOSIS — R87612 Low grade squamous intraepithelial lesion on cytologic smear of cervix (LGSIL): Secondary | ICD-10-CM

## 2017-08-12 NOTE — Progress Notes (Signed)
Laurie Reese [birth year] [id number]        32 y.o.  G0P0 Boyfriend x 1 year  RP: LGSIL for Colposcopy  HPI: Well on Mirena IUD x 08/2016.  No previous abnormal pap tests, before this last one, pap neg/HPV HR neg 06/2014.  No pelvic pain.  Normal vaginal secretions.  No recent STI screen.   OB History  Gravida Para Term Preterm AB Living  0            SAB TAB Ectopic Multiple Live Births               Past medical history,surgical history, problem list, medications, allergies, family history and social history were all reviewed and documented in the EPIC chart.   Directed ROS with pertinent positives and negatives documented in the history of present illness/assessment and plan.  Exam:  Vitals:   08/12/17 1135  BP: 128/80   General appearance:  Normal  Colposcopy Procedure Note Laurie Reese 08/12/2017  Indications: LGSIL  Procedure Details  The risks and benefits of the procedure and Verbal informed consent obtained.  Speculum placed in vagina and excellent visualization of cervix achieved, cervix swabbed x 3 with acetic acid solution.  Findings:  Cervix colposcopy: Physical Exam  Genitourinary:      Vaginal colposcopy: Normal  Vulvar colposcopy: Normal  Perirectal colposcopy: Grossly normal  The cervix was sprayed with Hurricane before performing the cervical biopsies.  Specimens: HPV 16-18-45, Gono-Chlam.  Cervical Bx 5 and 12 O'clock.  Complications:  None, hemostasis with Silver Nitrate . Plan: Per Bx results   Assessment/Plan:  32 y.o. G0P0   1. LGSIL on Pap smear of cervix Counseling on LGSIL done.colposcopy procedure reviewed before performing.  Probably mild cervical dysplasia, pending pathology results.  Findings reviewed with patient.  Management per results. - Pathology Report - HPV Type 16 and 18/45 RNA  2. Screening examination for venereal disease Strongly recommend strict condom use. C. trachomatis/N. gonorrhoeae  RNA  Counseling on above issues and coordination of care more than 50% for 15 minutes.  Genia Del MD, 12:07 PM 08/12/2017

## 2017-08-13 ENCOUNTER — Encounter: Payer: Self-pay | Admitting: Obstetrics & Gynecology

## 2017-08-13 LAB — C. TRACHOMATIS/N. GONORRHOEAE RNA
C. TRACHOMATIS RNA, TMA: NOT DETECTED
N. gonorrhoeae RNA, TMA: NOT DETECTED

## 2017-08-13 NOTE — Patient Instructions (Signed)
1. LGSIL on Pap smear of cervix Counseling on LGSIL done.colposcopy procedure reviewed before performing.  Probably mild cervical dysplasia, pending pathology results.  Findings reviewed with patient.  Management per results. - Pathology Report - HPV Type 16 and 18/45 RNA  2. Screening examination for venereal disease Strongly recommend strict condom use. C. trachomatis/N. gonorrhoeae RNA  Laurie Reese, good seeing you today!  I will inform you of your results as soon as they are available.

## 2017-08-16 LAB — TISSUE PATH REPORT

## 2017-08-16 LAB — PATHOLOGY

## 2017-08-16 LAB — HPV TYPE 16 AND 18/45 RNA
HPV Type 16 RNA: NOT DETECTED
HPV Type 18/45 RNA: NOT DETECTED

## 2017-09-10 DIAGNOSIS — L732 Hidradenitis suppurativa: Secondary | ICD-10-CM | POA: Diagnosis not present

## 2017-09-10 DIAGNOSIS — L709 Acne, unspecified: Secondary | ICD-10-CM | POA: Diagnosis not present

## 2017-09-23 ENCOUNTER — Other Ambulatory Visit: Payer: Self-pay | Admitting: Obstetrics & Gynecology

## 2017-09-23 ENCOUNTER — Encounter: Payer: Self-pay | Admitting: Obstetrics & Gynecology

## 2017-09-23 MED ORDER — METRONIDAZOLE 500 MG PO TABS: 500.0000 mg | ORAL_TABLET | Freq: Two times a day (BID) | ORAL | 0 refills | Status: DC

## 2017-11-05 DIAGNOSIS — G43009 Migraine without aura, not intractable, without status migrainosus: Secondary | ICD-10-CM | POA: Diagnosis not present

## 2017-11-05 DIAGNOSIS — E669 Obesity, unspecified: Secondary | ICD-10-CM | POA: Diagnosis not present

## 2017-12-31 DIAGNOSIS — L732 Hidradenitis suppurativa: Secondary | ICD-10-CM | POA: Diagnosis not present

## 2017-12-31 DIAGNOSIS — L700 Acne vulgaris: Secondary | ICD-10-CM | POA: Diagnosis not present

## 2018-01-31 DIAGNOSIS — L709 Acne, unspecified: Secondary | ICD-10-CM | POA: Diagnosis not present

## 2018-01-31 DIAGNOSIS — Z5181 Encounter for therapeutic drug level monitoring: Secondary | ICD-10-CM | POA: Diagnosis not present

## 2018-02-02 DIAGNOSIS — Z0000 Encounter for general adult medical examination without abnormal findings: Secondary | ICD-10-CM | POA: Diagnosis not present

## 2018-02-09 DIAGNOSIS — E669 Obesity, unspecified: Secondary | ICD-10-CM | POA: Diagnosis not present

## 2018-02-09 DIAGNOSIS — Z0000 Encounter for general adult medical examination without abnormal findings: Secondary | ICD-10-CM | POA: Diagnosis not present

## 2018-02-09 DIAGNOSIS — L732 Hidradenitis suppurativa: Secondary | ICD-10-CM | POA: Diagnosis not present

## 2018-02-09 DIAGNOSIS — R51 Headache: Secondary | ICD-10-CM | POA: Diagnosis not present

## 2018-02-09 DIAGNOSIS — L709 Acne, unspecified: Secondary | ICD-10-CM | POA: Diagnosis not present

## 2018-03-01 ENCOUNTER — Ambulatory Visit: Payer: 59 | Admitting: Obstetrics & Gynecology

## 2018-03-03 ENCOUNTER — Encounter: Payer: Self-pay | Admitting: Obstetrics & Gynecology

## 2018-03-03 ENCOUNTER — Ambulatory Visit: Payer: 59 | Admitting: Obstetrics & Gynecology

## 2018-03-03 VITALS — BP 128/82

## 2018-03-03 DIAGNOSIS — R87612 Low grade squamous intraepithelial lesion on cytologic smear of cervix (LGSIL): Secondary | ICD-10-CM | POA: Diagnosis not present

## 2018-03-03 DIAGNOSIS — N870 Mild cervical dysplasia: Secondary | ICD-10-CM | POA: Diagnosis not present

## 2018-03-03 DIAGNOSIS — Z1151 Encounter for screening for human papillomavirus (HPV): Secondary | ICD-10-CM

## 2018-03-03 DIAGNOSIS — Z30431 Encounter for routine checking of intrauterine contraceptive device: Secondary | ICD-10-CM

## 2018-03-03 NOTE — Patient Instructions (Signed)
1. Dysplasia of cervix, low grade (CIN 1) Pap May 2019 LGSIL.  Colposcopy June 2019 CIN-1 with HPV 16-18-45 negative.  Pap test with high risk HPV done today.  Management per results.  2. Encounter for routine checking of intrauterine contraceptive device (IUD) Mirena IUD well-tolerated and in good position with IUD strings felt at the exocervix.  Other orders - ISOtretinoin (ACCUTANE) 30 MG capsule; Take 30 mg by mouth 2 (two) times daily.  Laurie Reese, it was a pleasure seeing you today!  I will inform you of your results as soon as they are available.

## 2018-03-03 NOTE — Progress Notes (Signed)
    Laurie Reese 11/19/1985 [id number]        33 y.o.  G0P0 Engaged.  Getting married 07/2018.  RP: 6 month repeat Pap test  HPI:  Pap LGSIL 06/2017.  Colpo 07/2017 CIN 1.  HPV 16-18-45 negative.  Well on Mirena IUD x 08/2016.   OB History  Gravida Para Term Preterm AB Living  0            SAB TAB Ectopic Multiple Live Births               Past medical history,surgical history, problem list, medications, allergies, family history and social history were all reviewed and documented in the EPIC chart.   Directed ROS with pertinent positives and negatives documented in the history of present illness/assessment and plan.  Exam:  Vitals:   03/03/18 1530  BP: 128/82   General appearance:  Normal  Gynecologic exam: Vulva normal.  Speculum:  Cervix, vagina normal.  Pap/HPV HR done.  Normal secretions.  Bimanual exam:  Uterus AV, normal volume, NT.  No adnexal mass, NT.   IUD strings felt on bimanual exam.     Assessment/Plan:  33 y.o. G0P0   1. Dysplasia of cervix, low grade (CIN 1) Pap May 2019 LGSIL.  Colposcopy June 2019 CIN-1 with HPV 16-18-45 negative.  Pap test with high risk HPV done today.  Management per results.  2. Encounter for routine checking of intrauterine contraceptive device (IUD) Mirena IUD well-tolerated and in good position with IUD strings felt at the exocervix.  Other orders - ISOtretinoin (ACCUTANE) 30 MG capsule; Take 30 mg by mouth 2 (two) times daily.  Genia Del MD, 3:36 PM 03/03/2018

## 2018-03-04 LAB — PAP, TP IMAGING W/ HPV RNA, RFLX HPV TYPE 16,18/45: HPV DNA High Risk: DETECTED — AB

## 2018-03-07 ENCOUNTER — Telehealth: Payer: 59 | Admitting: Family

## 2018-03-07 DIAGNOSIS — H6092 Unspecified otitis externa, left ear: Secondary | ICD-10-CM | POA: Diagnosis not present

## 2018-03-07 DIAGNOSIS — L709 Acne, unspecified: Secondary | ICD-10-CM | POA: Diagnosis not present

## 2018-03-07 DIAGNOSIS — K130 Diseases of lips: Secondary | ICD-10-CM | POA: Diagnosis not present

## 2018-03-07 DIAGNOSIS — Z5181 Encounter for therapeutic drug level monitoring: Secondary | ICD-10-CM | POA: Diagnosis not present

## 2018-03-07 MED ORDER — AMOXICILLIN-POT CLAVULANATE 875-125 MG PO TABS: 1.0000 | ORAL_TABLET | Freq: Two times a day (BID) | ORAL | 0 refills | Status: DC

## 2018-03-07 NOTE — Progress Notes (Signed)
E Visit for Swimmer's Ear  We are sorry that you are not feeling well. Here is how we plan to help!  Based on what you have shared with me it looks like you have swimmers ear. Swimmer's ear is a redness or swelling, irritation, or infection of your outer ear canal.  These symptoms usually occur within a few days of swimming.  Your ear canal is a tube that goes from the opening of the ear to the eardrum.  When water stays in your ear canal, germs can grow.  This is a painful condition that often happens to children and swimmers of all ages.  It is not contagious and oral antibiotics are not required to treat uncomplicated swimmer's ear.  The usual symptoms include: Itching inside the ear, Redness or a sense of swelling in the ear, Pain when the ear is tugged on when pressure is placed on the ear, Pus draining from the infected ear.  Augmentin 875 mg one tablet by mouth twice a day for 10 days.  In certain cases swimmer's ear may progress to a more serious bacterial infection of the middle or inner ear.  If you have a fever 102 and up and significantly worsening symptoms, this could indicate a more serious infection moving to the middle/inner and needs face to face evaluation in an office by a provider.  Your symptoms should improve over the next 3 days and should resolve in about 7 days.  HOME CARE:   Wash your hands frequently.  Do not place the tip of the bottle on your ear or touch it with your fingers.  You can take Acetominophen 650 mg every 4-6 hours as needed for pain.  If pain is severe or moderate, you can apply a heating pad (set on low) or hot water bottle (wrapped in a towel) to outer ear for 20 minutes.  This will also increase drainage.  Avoid ear plugs  Do not use Q-tips  After showers, help the water run out by tilting your head to one side.  GET HELP RIGHT AWAY IF:   Fever is over 102.2 degrees.  You develop progressive ear pain or hearing loss.  Ear symptoms persist  longer than 3 days after treatment.  MAKE SURE YOU:   Understand these instructions.  Will watch your condition.  Will get help right away if you are not doing well or get worse.  TO PREVENT SWIMMER'S EAR:  Use a bathing cap or custom fitted swim molds to keep your ears dry.  Towel off after swimming to dry your ears.  Tilt your head or pull your earlobes to allow the water to escape your ear canal.  If there is still water in your ears, consider using a hairdryer on the lowest setting.  Thank you for choosing an e-visit. Your e-visit answers were reviewed by a board certified advanced clinical practitioner to complete your personal care plan. Depending upon the condition, your plan could have included both over the counter or prescription medications. Please review your pharmacy choice. Be sure that the pharmacy you have chosen is open so that you can pick up your prescription now.  If there is a problem you may message your provider in MyChart to have the prescription routed to another pharmacy. Your safety is important to [place]. If you have drug allergies check your prescription carefully.  For the next 24 hours, you can use MyChart to ask questions about today's visit, request a non-urgent call back, or ask  for a work or school excuse from your e-visit provider. You will get an email in the next two days asking about your experience. I hope that your e-visit has been valuable and will speed your recovery.

## 2018-03-11 ENCOUNTER — Telehealth: Payer: 59 | Admitting: Family

## 2018-03-11 DIAGNOSIS — B373 Candidiasis of vulva and vagina: Secondary | ICD-10-CM | POA: Diagnosis not present

## 2018-03-11 DIAGNOSIS — B3731 Acute candidiasis of vulva and vagina: Secondary | ICD-10-CM

## 2018-03-11 MED ORDER — FLUCONAZOLE 150 MG PO TABS: 150.0000 mg | ORAL_TABLET | Freq: Once | ORAL | 0 refills | Status: AC

## 2018-03-11 NOTE — Progress Notes (Signed)
Thank you for the details you included in the comment boxes. Those details are very helpful in determining the best course of treatment for you and help us to provide the best care. This is a situation where you could have a UTI, yet the typical symptoms with UTI are not necessarily present. I also noticed you mentioned there is some swelling, which would not be part of a UTI, but more consistent with a yeast infection. However, no discharge was mentioned. Also, the pain on the outside could also be burning secondary to urine affecting tissue irritated by a yeast infection.  Taking all this into account, nearly 85% of cases where women feel they have a UTI, the laboratory results back up their suspicious. Please reply to this message with more details so I know how to treat you. Such as: Does this feel like a typical UTI? Or more like a yeast infection?  In the meantime, I am sending Diflucan 150mg , x 1 tablet that you can take if you begin to get itching and milky or cottage-cheese-like vaginal discharge, which would be a yeast infection.  We are sorry that you are not feeling well. Here is how we plan to help! Based on what you shared with me it looks like you: May have a yeast vaginosis  Vaginosis is an inflammation of the vagina that can result in discharge, itching and pain. The cause is usually a change in the normal balance of vaginal bacteria or an infection. Vaginosis can also result from reduced estrogen levels after menopause.  The most common causes of vaginosis are:   Bacterial vaginosis which results from an overgrowth of one on several organisms that are normally present in your vagina.   Yeast infections which are caused by a naturally occurring fungus called candida.   Vaginal atrophy (atrophic vaginosis) which results from the thinning of the vagina from reduced estrogen levels after menopause.   Trichomoniasis which is caused by a parasite and is commonly transmitted by sexual  intercourse.  Factors that increase your risk of developing vaginosis include: Marland Kitchen. Medications, such as antibiotics and steroids . Uncontrolled diabetes . Use of hygiene products such as bubble bath, vaginal spray or vaginal deodorant . Douching . Wearing damp or tight-fitting clothing . Using an intrauterine device (IUD) for birth control . Hormonal changes, such as those associated with pregnancy, birth control pills or menopause . Sexual activity . Having a sexually transmitted infection  Your treatment plan is A single Diflucan (fluconazole) 150mg  tablet once.  I have electronically sent this prescription into the pharmacy that you have chosen.  Be sure to take all of the medication as directed. Stop taking any medication if you develop a rash, tongue swelling or shortness of breath. Mothers who are breast feeding should consider pumping and discarding their breast milk while on these antibiotics. However, there is no consensus that infant exposure at these doses would be harmful.  Remember that medication creams can weaken latex condoms. Marland Kitchen.   HOME CARE:  Good hygiene may prevent some types of vaginosis from recurring and may relieve some symptoms:  . Avoid baths, hot tubs and whirlpool spas. Rinse soap from your outer genital area after a shower, and dry the area well to prevent irritation. Don't use scented or harsh soaps, such as those with deodorant or antibacterial action. Marland Kitchen. Avoid irritants. These include scented tampons and pads. . Wipe from front to back after using the toilet. Doing so avoids spreading fecal bacteria to your  vagina.  Other things that may help prevent vaginosis include:  Marland Kitchen Don't douche. Your vagina doesn't require cleansing other than normal bathing. Repetitive douching disrupts the normal organisms that reside in the vagina and can actually increase your risk of vaginal infection. Douching won't clear up a vaginal infection. . Use a latex condom. Both female  and female latex condoms may help you avoid infections spread by sexual contact. . Wear cotton underwear. Also wear pantyhose with a cotton crotch. If you feel comfortable without it, skip wearing underwear to bed. Yeast thrives in [employer] Your symptoms should improve in the next day or two.  GET HELP RIGHT AWAY IF:  . You have pain in your lower abdomen ( pelvic area or over your ovaries) . You develop nausea or vomiting . You develop a fever . Your discharge changes or worsens . You have persistent pain with intercourse . You develop shortness of breath, a rapid pulse, or you faint.  These symptoms could be signs of problems or infections that need to be evaluated by a medical provider now.  MAKE SURE YOU    Understand these instructions.  Will watch your condition.  Will get help right away if you are not doing well or get worse.  Your e-visit answers were reviewed by a board certified advanced clinical practitioner to complete your personal care plan. Depending upon the condition, your plan could have included both over the counter or prescription medications. Please review your pharmacy choice to make sure that you have choses a pharmacy that is open for you to pick up any needed prescription, Your safety is important to [place]. If you have drug allergies check your prescription carefully.   You can use MyChart to ask questions about today's visit, request a non-urgent call back, or ask for a work or school excuse for 24 hours related to this e-Visit. If it has been greater than 24 hours you will need to follow up with your provider, or enter a new e-Visit to address those concerns. You will get a MyChart message within the next two days asking about your experience. I hope that your e-visit has been valuable and will speed your recovery.

## 2018-04-08 DIAGNOSIS — L709 Acne, unspecified: Secondary | ICD-10-CM | POA: Diagnosis not present

## 2018-04-08 DIAGNOSIS — Z5181 Encounter for therapeutic drug level monitoring: Secondary | ICD-10-CM | POA: Diagnosis not present

## 2018-04-08 DIAGNOSIS — K130 Diseases of lips: Secondary | ICD-10-CM | POA: Diagnosis not present

## 2018-04-08 DIAGNOSIS — L700 Acne vulgaris: Secondary | ICD-10-CM | POA: Diagnosis not present

## 2018-05-05 ENCOUNTER — Telehealth: Payer: 59 | Admitting: Family

## 2018-05-05 DIAGNOSIS — B3731 Acute candidiasis of vulva and vagina: Secondary | ICD-10-CM

## 2018-05-05 DIAGNOSIS — B373 Candidiasis of vulva and vagina: Secondary | ICD-10-CM

## 2018-05-05 MED ORDER — FLUCONAZOLE 150 MG PO TABS: 150.0000 mg | ORAL_TABLET | Freq: Once | ORAL | 0 refills | Status: AC

## 2018-05-05 NOTE — Progress Notes (Signed)
Greater than 5 minutes, yet less than 10 minutes of time have been spent researching, coordinating, and implementing care for this patient today.  Thank you for the details you included in the comment boxes. Those details are very helpful in determining the best course of treatment for you and help us to provide the best care.  We are sorry that you are not feeling well. Here is how we plan to help! Based on what you shared with me it looks like you: May have a yeast vaginosis  Vaginosis is an inflammation of the vagina that can result in discharge, itching and pain. The cause is usually a change in the normal balance of vaginal bacteria or an infection. Vaginosis can also result from reduced estrogen levels after menopause.  The most common causes of vaginosis are:   Bacterial vaginosis which results from an overgrowth of one on several organisms that are normally present in your vagina.   Yeast infections which are caused by a naturally occurring fungus called candida.   Vaginal atrophy (atrophic vaginosis) which results from the thinning of the vagina from reduced estrogen levels after menopause.   Trichomoniasis which is caused by a parasite and is commonly transmitted by sexual intercourse.  Factors that increase your risk of developing vaginosis include: . Medications, such as antibiotics and steroids . Uncontrolled diabetes . Use of hygiene products such as bubble bath, vaginal spray or vaginal deodorant . Douching . Wearing damp or tight-fitting clothing . Using an intrauterine device (IUD) for birth control . Hormonal changes, such as those associated with pregnancy, birth control pills or menopause . Sexual activity . Having a sexually transmitted infection  Your treatment plan is A single Diflucan (fluconazole) 150mg tablet once.  I have electronically sent this prescription into the pharmacy that you have chosen.  Be sure to take all of the medication as directed. Stop  taking any medication if you develop a rash, tongue swelling or shortness of breath. Mothers who are breast feeding should consider pumping and discarding their breast milk while on these antibiotics. However, there is no consensus that infant exposure at these doses would be harmful.  Remember that medication creams can weaken latex condoms. .   HOME CARE:  Good hygiene may prevent some types of vaginosis from recurring and may relieve some symptoms:  . Avoid baths, hot tubs and whirlpool spas. Rinse soap from your outer genital area after a shower, and dry the area well to prevent irritation. Don't use scented or harsh soaps, such as those with deodorant or antibacterial action. . Avoid irritants. These include scented tampons and pads. . Wipe from front to back after using the toilet. Doing so avoids spreading fecal bacteria to your vagina.  Other things that may help prevent vaginosis include:  . Don't douche. Your vagina doesn't require cleansing other than normal bathing. Repetitive douching disrupts the normal organisms that reside in the vagina and can actually increase your risk of vaginal infection. Douching won't clear up a vaginal infection. . Use a latex condom. Both female and female latex condoms may help you avoid infections spread by sexual contact. . Wear cotton underwear. Also wear pantyhose with a cotton crotch. If you feel comfortable without it, skip wearing underwear to bed. Yeast thrives in moist environments Your symptoms should improve in the next day or two.  GET HELP RIGHT AWAY IF:  . You have pain in your lower abdomen ( pelvic area or over your ovaries) . You develop nausea   or vomiting . You develop a fever . Your discharge changes or worsens . You have persistent pain with intercourse . You develop shortness of breath, a rapid pulse, or you faint.  These symptoms could be signs of problems or infections that need to be evaluated by a medical provider  now.  MAKE SURE YOU    Understand these instructions.  Will watch your condition.  Will get help right away if you are not doing well or get worse.  Your e-visit answers were reviewed by a board certified advanced clinical practitioner to complete your personal care plan. Depending upon the condition, your plan could have included both over the counter or prescription medications. Please review your pharmacy choice to make sure that you have choses a pharmacy that is open for you to pick up any needed prescription, Your safety is important to us. If you have drug allergies check your prescription carefully.   You can use MyChart to ask questions about today's visit, request a non-urgent call back, or ask for a work or school excuse for 24 hours related to this e-Visit. If it has been greater than 24 hours you will need to follow up with your provider, or enter a new e-Visit to address those concerns. You will get a MyChart message within the next two days asking about your experience. I hope that your e-visit has been valuable and will speed your recovery.  

## 2018-05-09 DIAGNOSIS — Z5181 Encounter for therapeutic drug level monitoring: Secondary | ICD-10-CM | POA: Diagnosis not present

## 2018-05-09 DIAGNOSIS — K130 Diseases of lips: Secondary | ICD-10-CM | POA: Diagnosis not present

## 2018-05-09 DIAGNOSIS — L709 Acne, unspecified: Secondary | ICD-10-CM | POA: Diagnosis not present

## 2018-05-11 DIAGNOSIS — H10413 Chronic giant papillary conjunctivitis, bilateral: Secondary | ICD-10-CM | POA: Diagnosis not present

## 2018-05-11 DIAGNOSIS — H02885 Meibomian gland dysfunction left lower eyelid: Secondary | ICD-10-CM | POA: Diagnosis not present

## 2018-06-08 DIAGNOSIS — K130 Diseases of lips: Secondary | ICD-10-CM | POA: Diagnosis not present

## 2018-06-08 DIAGNOSIS — Z5181 Encounter for therapeutic drug level monitoring: Secondary | ICD-10-CM | POA: Diagnosis not present

## 2018-06-08 DIAGNOSIS — L700 Acne vulgaris: Secondary | ICD-10-CM | POA: Diagnosis not present

## 2018-06-08 DIAGNOSIS — L709 Acne, unspecified: Secondary | ICD-10-CM | POA: Diagnosis not present

## 2018-07-06 ENCOUNTER — Other Ambulatory Visit: Payer: Self-pay

## 2018-07-07 ENCOUNTER — Ambulatory Visit (INDEPENDENT_AMBULATORY_CARE_PROVIDER_SITE_OTHER): Payer: 59 | Admitting: Obstetrics & Gynecology

## 2018-07-07 ENCOUNTER — Encounter: Payer: Self-pay | Admitting: Obstetrics & Gynecology

## 2018-07-07 VITALS — BP 126/88 | Ht 66.5 in | Wt 186.0 lb

## 2018-07-07 DIAGNOSIS — R87618 Other abnormal cytological findings on specimens from cervix uteri: Secondary | ICD-10-CM | POA: Diagnosis not present

## 2018-07-07 DIAGNOSIS — Z30431 Encounter for routine checking of intrauterine contraceptive device: Secondary | ICD-10-CM | POA: Diagnosis not present

## 2018-07-07 DIAGNOSIS — Z01419 Encounter for gynecological examination (general) (routine) without abnormal findings: Secondary | ICD-10-CM

## 2018-07-07 DIAGNOSIS — Z1151 Encounter for screening for human papillomavirus (HPV): Secondary | ICD-10-CM | POA: Diagnosis not present

## 2018-07-07 DIAGNOSIS — N870 Mild cervical dysplasia: Secondary | ICD-10-CM | POA: Diagnosis not present

## 2018-07-07 DIAGNOSIS — N898 Other specified noninflammatory disorders of vagina: Secondary | ICD-10-CM | POA: Diagnosis not present

## 2018-07-07 LAB — WET PREP FOR TRICH, YEAST, CLUE

## 2018-07-07 NOTE — Progress Notes (Signed)
Laurie Reese Sep 09, 1985 [id number]   History:    33 y.o. G0  Engaged, planning wedding in 10/2018.  RP:  Established patient presenting for annual gyn exam   HPI:  Well on Mirena IUD 08/2016.  No breakthrough bleeding.  No pelvic pain.  No pain with intercourse.  Mild increase in vaginal discharge.  Urine and bowel movements normal.  Breast normal.  Body mass index 29.57.  Exercises regularly.  Past medical history,surgical history, family history and social history were all reviewed and documented in the EPIC chart.  Gynecologic History Patient's last menstrual period was 07/01/2018. Contraception: Mirena IUD x 08/2016 Last Pap: 03/2018. Results were: LGSIL/HPV HR pos.  Colpo 07/2017 CIN 1  Last mammogram: Never Bone Density: Never Colonoscopy: Never  Obstetric History OB History  Gravida Para Term Preterm AB Living  0            SAB TAB Ectopic Multiple Live Births                ROS: A ROS was performed and pertinent positives and negatives are included in the history.  GENERAL: No fevers or chills. HEENT: No change in vision, no earache, sore throat or sinus congestion. NECK: No pain or stiffness. CARDIOVASCULAR: No chest pain or pressure. No palpitations. PULMONARY: No shortness of breath, cough or wheeze. GASTROINTESTINAL: No abdominal pain, nausea, vomiting or diarrhea, melena or bright red blood per rectum. GENITOURINARY: No urinary frequency, urgency, hesitancy or dysuria. MUSCULOSKELETAL: No joint or muscle pain, no back pain, no recent trauma. DERMATOLOGIC: No rash, no itching, no lesions. ENDOCRINE: No polyuria, polydipsia, no heat or cold intolerance. No recent change in weight. HEMATOLOGICAL: No anemia or easy bruising or bleeding. NEUROLOGIC: No headache, seizures, numbness, tingling or weakness. PSYCHIATRIC: No depression, no loss of interest in normal activity or change in sleep pattern.     Exam:   BP 126/88    Ht 5' 6.5" (1.689 m)    Wt 186 lb (84.4 kg)    LMP  07/01/2018    BMI 29.57 kg/m   Body mass index is 29.57 kg/m.  General appearance : Well developed well nourished female. No acute distress HEENT: Eyes: no retinal hemorrhage or exudates,  Neck supple, trachea midline, no carotid bruits, no thyroidmegaly Lungs: Clear to auscultation, no rhonchi or wheezes, or rib retractions  Heart: Regular rate and rhythm, no murmurs or gallops Breast:Examined in sitting and supine position were symmetrical in appearance, no palpable masses or tenderness,  no skin retraction, no nipple inversion, no nipple discharge, no skin discoloration, no axillary or supraclavicular lymphadenopathy Abdomen: no palpable masses or tenderness, no rebound or guarding Extremities: no edema or skin discoloration or tenderness  Pelvic: Vulva: Normal             Vagina: No gross lesions or discharge.  Wet prep done.  Cervix: No gross lesions or discharge.  IUD strings visible. Pap/HPV HR done.  Uterus  RV, normal size, shape and consistency, non-tender and mobile  Adnexa  Without masses or tenderness  Anus: Normal  Wet prep negative.   Assessment/Plan:  33 y.o. female for annual exam   1. Encounter for routine gynecological examination with Papanicolaou smear of cervix Normal gynecologic exam.  Pap test with high-risk HPV done today.  Breast exam normal.  Body mass index at 29.57.  Encourage a slightly lower calorie/carb diet and regular physical activities with aerobic activities 5 times a week and weightlifting every 2 days.  2.  Dysplasia of cervix, low grade (CIN 1) Pap test with high-risk HPV done today.  3. Encounter for routine checking of intrauterine contraceptive device (IUD) Well on Mirena IUD since July 2018.  IUD in good position.  4. Vaginal discharge Wet prep negative, patient reassured.  Will call back for treatment if symptoms recur. - WET PREP FOR TRICH, YEAST, CLUE  Genia Del MD, 8:25 AM 07/07/2018

## 2018-07-08 ENCOUNTER — Encounter: Payer: Self-pay | Admitting: Obstetrics & Gynecology

## 2018-07-08 LAB — PAP IG W/ RFLX HPV ASCU

## 2018-07-08 NOTE — Patient Instructions (Signed)
1. Encounter for routine gynecological examination with Papanicolaou smear of cervix Normal gynecologic exam.  Pap test with high-risk HPV done today.  Breast exam normal.  Body mass index at 29.57.  Encourage a slightly lower calorie/carb diet and regular physical activities with aerobic activities 5 times a week and weightlifting every 2 days.  2. Dysplasia of cervix, low grade (CIN 1) Pap test with high-risk HPV done today.  3. Encounter for routine checking of intrauterine contraceptive device (IUD) Well on Mirena IUD since July 2018.  IUD in good position.  4. Vaginal discharge Wet prep negative, patient reassured.  Will call back for treatment if symptoms recur. - WET PREP FOR TRICH, YEAST, CLUE  Laurie Reese, it was a pleasure seeing you today!  I will inform you of your results as soon as they are available.

## 2018-07-11 DIAGNOSIS — Z5181 Encounter for therapeutic drug level monitoring: Secondary | ICD-10-CM | POA: Diagnosis not present

## 2018-07-11 DIAGNOSIS — L709 Acne, unspecified: Secondary | ICD-10-CM | POA: Diagnosis not present

## 2018-07-11 DIAGNOSIS — L259 Unspecified contact dermatitis, unspecified cause: Secondary | ICD-10-CM | POA: Diagnosis not present

## 2018-07-11 DIAGNOSIS — L700 Acne vulgaris: Secondary | ICD-10-CM | POA: Diagnosis not present

## 2018-07-13 ENCOUNTER — Ambulatory Visit (INDEPENDENT_AMBULATORY_CARE_PROVIDER_SITE_OTHER): Payer: 59 | Admitting: Pediatrics

## 2018-07-13 ENCOUNTER — Other Ambulatory Visit: Payer: Self-pay

## 2018-07-13 ENCOUNTER — Encounter: Payer: Self-pay | Admitting: Pediatrics

## 2018-07-13 DIAGNOSIS — Z23 Encounter for immunization: Secondary | ICD-10-CM | POA: Diagnosis not present

## 2018-07-13 NOTE — Progress Notes (Signed)
Presented today for Tdap vaccine. No new questions on vaccine. Parent was counseled on risks benefits of vaccine and parent verbalized understanding. Handout (VIS) given for each vaccine.

## 2018-08-11 DIAGNOSIS — L709 Acne, unspecified: Secondary | ICD-10-CM | POA: Diagnosis not present

## 2018-08-11 DIAGNOSIS — Z5181 Encounter for therapeutic drug level monitoring: Secondary | ICD-10-CM | POA: Diagnosis not present

## 2018-08-11 DIAGNOSIS — K130 Diseases of lips: Secondary | ICD-10-CM | POA: Diagnosis not present

## 2018-08-30 DIAGNOSIS — H5213 Myopia, bilateral: Secondary | ICD-10-CM | POA: Diagnosis not present

## 2018-10-12 DIAGNOSIS — Z5181 Encounter for therapeutic drug level monitoring: Secondary | ICD-10-CM | POA: Diagnosis not present

## 2018-10-12 DIAGNOSIS — L700 Acne vulgaris: Secondary | ICD-10-CM | POA: Diagnosis not present

## 2018-10-28 ENCOUNTER — Other Ambulatory Visit: Payer: Self-pay

## 2018-10-31 ENCOUNTER — Ambulatory Visit: Payer: 59 | Admitting: Obstetrics & Gynecology

## 2018-10-31 ENCOUNTER — Encounter: Payer: Self-pay | Admitting: Obstetrics & Gynecology

## 2018-10-31 ENCOUNTER — Other Ambulatory Visit: Payer: Self-pay

## 2018-10-31 VITALS — BP 130/82

## 2018-10-31 DIAGNOSIS — Z3169 Encounter for other general counseling and advice on procreation: Secondary | ICD-10-CM | POA: Diagnosis not present

## 2018-10-31 DIAGNOSIS — Z30432 Encounter for removal of intrauterine contraceptive device: Secondary | ICD-10-CM

## 2018-10-31 NOTE — Progress Notes (Signed)
    Laurie Reese 1985-10-07 [id number]        33 y.o.  G0  Just got married.    RP: Mirena IUD removal  HPI: Well on Mirena IUD.  Ready to attempt conception.     OB History  Gravida Para Term Preterm AB Living  0            SAB TAB Ectopic Multiple Live Births               Past medical history,surgical history, problem list, medications, allergies, family history and social history were all reviewed and documented in the EPIC chart.   Directed ROS with pertinent positives and negatives documented in the history of present illness/assessment and plan.  Exam:  Vitals:   10/31/18 1505  BP: 130/82   General appearance:  Normal  Abdomen: Normal  Gynecologic exam: Vulva normal.  Speculum: Cervix normal.  Vagina normal.  Normal vaginal secretions.  Short IUD strings visible.  IUD strings easily grasped with an IUD clamp.  IUD easily removed by pulling on the strings.  IUD complete and intact.  Shown to patient and discarded.  Procedure well-tolerated by patient with no complication.   Assessment/Plan:  33 y.o. G0  1. Encounter for IUD removal Easy removal of the Mirena IUD.  No complication and well-tolerated by patient.  IUD complete and intact, shown to patient and discarded.  2. Encounter for preconception consultation Started on prenatal vitamins.  Maintain fitness and continue with healthy nutrition.  Will come here for pregnancy confirmation and first OB ultrasound.  Counseling on above issues and coordination of care more than 50% for 15 minutes.  Princess Bruins MD, 3:23 PM 10/31/2018

## 2018-10-31 NOTE — Patient Instructions (Signed)
1. Encounter for IUD removal Easy removal of the Mirena IUD.  No complication and well-tolerated by patient.  IUD complete and intact, shown to patient and discarded.  2. Encounter for preconception consultation Started on prenatal vitamins.  Maintain fitness and continue with healthy nutrition.  Will come here for pregnancy confirmation and first OB ultrasound.  Laurie Reese, it was a pleasure seeing you today!

## 2019-01-11 DIAGNOSIS — Z3689 Encounter for other specified antenatal screening: Secondary | ICD-10-CM | POA: Diagnosis not present

## 2019-01-11 DIAGNOSIS — Z3200 Encounter for pregnancy test, result unknown: Secondary | ICD-10-CM | POA: Diagnosis not present

## 2019-02-02 DIAGNOSIS — Z3201 Encounter for pregnancy test, result positive: Secondary | ICD-10-CM | POA: Diagnosis not present

## 2019-02-06 DIAGNOSIS — Z0000 Encounter for general adult medical examination without abnormal findings: Secondary | ICD-10-CM | POA: Diagnosis not present

## 2019-02-15 LAB — OB RESULTS CONSOLE HEPATITIS B SURFACE ANTIGEN: Hepatitis B Surface Ag: NEGATIVE

## 2019-02-15 LAB — OB RESULTS CONSOLE GC/CHLAMYDIA
Chlamydia: NEGATIVE
Gonorrhea: NEGATIVE

## 2019-02-15 LAB — OB RESULTS CONSOLE HIV ANTIBODY (ROUTINE TESTING): HIV: NONREACTIVE

## 2019-02-15 LAB — OB RESULTS CONSOLE ANTIBODY SCREEN: Antibody Screen: NEGATIVE

## 2019-02-15 LAB — OB RESULTS CONSOLE ABO/RH: RH Type: POSITIVE

## 2019-02-15 LAB — OB RESULTS CONSOLE RPR: RPR: NONREACTIVE

## 2019-02-15 LAB — OB RESULTS CONSOLE RUBELLA ANTIBODY, IGM: Rubella: IMMUNE

## 2019-02-17 DIAGNOSIS — Z3401 Encounter for supervision of normal first pregnancy, first trimester: Secondary | ICD-10-CM | POA: Diagnosis not present

## 2019-02-17 DIAGNOSIS — Z3689 Encounter for other specified antenatal screening: Secondary | ICD-10-CM | POA: Diagnosis not present

## 2019-02-21 DIAGNOSIS — J0110 Acute frontal sinusitis, unspecified: Secondary | ICD-10-CM | POA: Diagnosis not present

## 2019-02-22 DIAGNOSIS — Z3689 Encounter for other specified antenatal screening: Secondary | ICD-10-CM | POA: Diagnosis not present

## 2019-02-22 DIAGNOSIS — Z3401 Encounter for supervision of normal first pregnancy, first trimester: Secondary | ICD-10-CM | POA: Diagnosis not present

## 2019-03-03 NOTE — L&D Delivery Note (Signed)
Delivery Note At 6:10 PM a viable and healthy female was delivered via Vaginal, Spontaneous (Presentation: Right Occiput Anterior).  APGAR: 9, 9; weight  .   Placenta status: Spontaneous, Intact.  Cord: 3 vessels with the following complications: None.  Cord pH: na  Anesthesia: Epidural Episiotomy: None Lacerations: 2nd degree Suture Repair: 2.0 vicryl rapide Est. Blood Loss (mL):  400  Mom to postpartum.  Baby to Couplet care / Skin to Skin.  Laurie Reese J 09/15/2019, 6:33 PM

## 2019-03-07 ENCOUNTER — Telehealth: Payer: 59 | Admitting: Family

## 2019-03-07 DIAGNOSIS — H60331 Swimmer's ear, right ear: Secondary | ICD-10-CM

## 2019-03-07 MED ORDER — NEOMYCIN-POLYMYXIN-HC 3.5-10000-1 OT SOLN: 4.0000 [drp] | Freq: Three times a day (TID) | OTIC | 0 refills | Status: DC

## 2019-03-07 NOTE — Progress Notes (Signed)
E Visit for Swimmer's Ear  We are sorry that you are not feeling well. Here is how we plan to help!  I have prescribed: Neomycin 0.35%, polymyxin B 10,000 units/mL, and hydrocortisone 0,5% otic solution 4 drops in affected ears four times a day for 7 days   In certain cases swimmer's ear may progress to a more serious bacterial infection of the middle or inner ear.  If you have a fever 102 and up and significantly worsening symptoms, this could indicate a more serious infection moving to the middle/inner and needs face to face evaluation in an office by a provider.  Your symptoms should improve over the next 3 days and should resolve in about 7 days.  HOME CARE:   Wash your hands frequently.  Do not place the tip of the bottle on your ear or touch it with your fingers.  You can take Acetominophen 650 mg every 4-6 hours as needed for pain.  If pain is severe or moderate, you can apply a heating pad (set on low) or hot water bottle (wrapped in a towel) to outer ear for 20 minutes.  This will also increase drainage.  Avoid ear plugs  Do not use Q-tips  After showers, help the water run out by tilting your head to one side.  GET HELP RIGHT AWAY IF:   Fever is over 102.2 degrees.  You develop progressive ear pain or hearing loss.  Ear symptoms persist longer than 3 days after treatment.  MAKE SURE YOU:   Understand these instructions.  Will watch your condition.  Will get help right away if you are not doing well or get worse.  TO PREVENT SWIMMER'S EAR:  Use a bathing cap or custom fitted swim molds to keep your ears dry.  Towel off after swimming to dry your ears.  Tilt your head or pull your earlobes to allow the water to escape your ear canal.  If there is still water in your ears, consider using a hairdryer on the lowest setting.  Thank you for choosing an e-visit. Your e-visit answers were reviewed by a board certified advanced clinical practitioner to complete  your personal care plan. Depending upon the condition, your plan could have included both over the counter or prescription medications. Please review your pharmacy choice. Be sure that the pharmacy you have chosen is open so that you can pick up your prescription now.  If there is a problem you may message your provider in MyChart to have the prescription routed to another pharmacy. Your safety is important to us. If you have drug allergies check your prescription carefully.  For the next 24 hours, you can use MyChart to ask questions about today's visit, request a non-urgent call back, or ask for a work or school excuse from your e-visit provider. You will get an email in the next two days asking about your experience. I hope that your e-visit has been valuable and will speed your recovery.    Greater than 5 minutes, yet less than 10 minutes of time have been spent researching, coordinating, and implementing care for this patient today.  Thank you for the details you included in the comment boxes. Those details are very helpful in determining the best course of treatment for you and help us to provide the best care.  

## 2019-04-05 DIAGNOSIS — Z361 Encounter for antenatal screening for raised alphafetoprotein level: Secondary | ICD-10-CM | POA: Diagnosis not present

## 2019-04-28 DIAGNOSIS — Z363 Encounter for antenatal screening for malformations: Secondary | ICD-10-CM | POA: Diagnosis not present

## 2019-05-12 DIAGNOSIS — Z362 Encounter for other antenatal screening follow-up: Secondary | ICD-10-CM | POA: Diagnosis not present

## 2019-06-09 DIAGNOSIS — Z3689 Encounter for other specified antenatal screening: Secondary | ICD-10-CM | POA: Diagnosis not present

## 2019-06-27 DIAGNOSIS — Z3689 Encounter for other specified antenatal screening: Secondary | ICD-10-CM | POA: Diagnosis not present

## 2019-06-27 DIAGNOSIS — Z23 Encounter for immunization: Secondary | ICD-10-CM | POA: Diagnosis not present

## 2019-07-15 ENCOUNTER — Ambulatory Visit: Payer: 59

## 2019-07-15 ENCOUNTER — Ambulatory Visit: Payer: 59 | Attending: Internal Medicine

## 2019-07-15 ENCOUNTER — Other Ambulatory Visit: Payer: Self-pay

## 2019-07-15 DIAGNOSIS — Z23 Encounter for immunization: Secondary | ICD-10-CM

## 2019-07-15 NOTE — Progress Notes (Signed)
   Covid-19 Vaccination Clinic  Name:  Laurie Reese    MRN: [id number] DOB: 1985-03-29  07/15/2019  Ms. Laurie Reese was observed post Covid-19 immunization for 15 minutes without incident. She was provided with Vaccine Information Sheet and instruction to access the V-Safe system.   Ms. Laurie Reese was instructed to call 911 with any severe reactions post vaccine: Marland Kitchen Difficulty breathing  . Swelling of face and throat  . A fast heartbeat  . A bad rash all over body  . Dizziness and weakness   Immunizations Administered    Name Date Dose VIS Date Route   Pfizer COVID-19 Vaccine 07/15/2019 11:03 AM 0.3 mL 04/26/2018 Intramuscular   Manufacturer: [employer], [employer]   Lot: [id number]   NDC: [address]-7

## 2019-08-10 ENCOUNTER — Ambulatory Visit: Payer: 59

## 2019-08-14 DIAGNOSIS — Z3482 Encounter for supervision of other normal pregnancy, second trimester: Secondary | ICD-10-CM | POA: Diagnosis not present

## 2019-08-14 DIAGNOSIS — Z3483 Encounter for supervision of other normal pregnancy, third trimester: Secondary | ICD-10-CM | POA: Diagnosis not present

## 2019-08-16 DIAGNOSIS — Z3685 Encounter for antenatal screening for Streptococcus B: Secondary | ICD-10-CM | POA: Diagnosis not present

## 2019-08-21 DIAGNOSIS — O3663X0 Maternal care for excessive fetal growth, third trimester, not applicable or unspecified: Secondary | ICD-10-CM | POA: Diagnosis not present

## 2019-08-21 DIAGNOSIS — Z3A36 36 weeks gestation of pregnancy: Secondary | ICD-10-CM | POA: Diagnosis not present

## 2019-09-07 ENCOUNTER — Encounter (HOSPITAL_COMMUNITY): Payer: Self-pay | Admitting: *Deleted

## 2019-09-07 ENCOUNTER — Telehealth (HOSPITAL_COMMUNITY): Payer: Self-pay | Admitting: *Deleted

## 2019-09-07 NOTE — Telephone Encounter (Signed)
Preadmission screen  

## 2019-09-12 ENCOUNTER — Other Ambulatory Visit: Payer: Self-pay | Admitting: Obstetrics and Gynecology

## 2019-09-13 ENCOUNTER — Other Ambulatory Visit (HOSPITAL_COMMUNITY)
Admission: RE | Admit: 2019-09-13 | Discharge: 2019-09-13 | Disposition: A | Discharge status: Home / Self Care | Payer: 59 | Source: Ambulatory Visit | Attending: Obstetrics and Gynecology | Admitting: Obstetrics and Gynecology

## 2019-09-13 DIAGNOSIS — D649 Anemia, unspecified: Secondary | ICD-10-CM | POA: Diagnosis not present

## 2019-09-13 DIAGNOSIS — Z3A40 40 weeks gestation of pregnancy: Secondary | ICD-10-CM | POA: Diagnosis not present

## 2019-09-13 DIAGNOSIS — E669 Obesity, unspecified: Secondary | ICD-10-CM | POA: Diagnosis not present

## 2019-09-13 DIAGNOSIS — Z20822 Contact with and (suspected) exposure to covid-19: Secondary | ICD-10-CM | POA: Diagnosis not present

## 2019-09-13 DIAGNOSIS — O9902 Anemia complicating childbirth: Secondary | ICD-10-CM | POA: Diagnosis not present

## 2019-09-13 DIAGNOSIS — O480 Post-term pregnancy: Secondary | ICD-10-CM | POA: Diagnosis not present

## 2019-09-13 DIAGNOSIS — O134 Gestational [pregnancy-induced] hypertension without significant proteinuria, complicating childbirth: Secondary | ICD-10-CM | POA: Diagnosis not present

## 2019-09-13 DIAGNOSIS — O99214 Obesity complicating childbirth: Secondary | ICD-10-CM | POA: Diagnosis not present

## 2019-09-13 LAB — SARS CORONAVIRUS 2 (TAT 6-24 HRS): SARS Coronavirus 2: NEGATIVE

## 2019-09-15 ENCOUNTER — Other Ambulatory Visit: Payer: Self-pay

## 2019-09-15 ENCOUNTER — Inpatient Hospital Stay (HOSPITAL_COMMUNITY): Payer: 59 | Admitting: Anesthesiology

## 2019-09-15 ENCOUNTER — Encounter (HOSPITAL_COMMUNITY): Payer: Self-pay | Admitting: Obstetrics and Gynecology

## 2019-09-15 ENCOUNTER — Inpatient Hospital Stay (HOSPITAL_COMMUNITY)
Admission: RE | Admit: 2019-09-15 | Discharge: 2019-09-17 | DRG: 807 | Disposition: A | Discharge status: Home / Self Care | Payer: 59 | Attending: Obstetrics and Gynecology | Admitting: Obstetrics and Gynecology

## 2019-09-15 ENCOUNTER — Inpatient Hospital Stay (HOSPITAL_COMMUNITY): Payer: 59

## 2019-09-15 DIAGNOSIS — Z20822 Contact with and (suspected) exposure to covid-19: Secondary | ICD-10-CM | POA: Diagnosis present

## 2019-09-15 DIAGNOSIS — D649 Anemia, unspecified: Secondary | ICD-10-CM | POA: Diagnosis present

## 2019-09-15 DIAGNOSIS — O480 Post-term pregnancy: Secondary | ICD-10-CM | POA: Diagnosis present

## 2019-09-15 DIAGNOSIS — O134 Gestational [pregnancy-induced] hypertension without significant proteinuria, complicating childbirth: Secondary | ICD-10-CM | POA: Diagnosis present

## 2019-09-15 DIAGNOSIS — E669 Obesity, unspecified: Secondary | ICD-10-CM | POA: Diagnosis present

## 2019-09-15 DIAGNOSIS — Z3A40 40 weeks gestation of pregnancy: Secondary | ICD-10-CM

## 2019-09-15 DIAGNOSIS — O99214 Obesity complicating childbirth: Secondary | ICD-10-CM | POA: Diagnosis present

## 2019-09-15 DIAGNOSIS — Z3490 Encounter for supervision of normal pregnancy, unspecified, unspecified trimester: Secondary | ICD-10-CM | POA: Diagnosis present

## 2019-09-15 DIAGNOSIS — O9902 Anemia complicating childbirth: Secondary | ICD-10-CM | POA: Diagnosis present

## 2019-09-15 LAB — CBC
HCT: 32.4 % — ABNORMAL LOW (ref 36.0–46.0)
Hemoglobin: 10.7 g/dL — ABNORMAL LOW (ref 12.0–15.0)
MCH: 29.2 pg (ref 26.0–34.0)
MCHC: 33 g/dL (ref 30.0–36.0)
MCV: 88.5 fL (ref 80.0–100.0)
Platelets: 212 10*3/uL (ref 150–400)
RBC: 3.66 MIL/uL — ABNORMAL LOW (ref 3.87–5.11)
RDW: 13.1 % (ref 11.5–15.5)
WBC: 10.5 10*3/uL (ref 4.0–10.5)
nRBC: 0 % (ref 0.0–0.2)

## 2019-09-15 LAB — ABO/RH: ABO/RH(D): O POS

## 2019-09-15 LAB — RPR: RPR Ser Ql: NONREACTIVE

## 2019-09-15 LAB — TYPE AND SCREEN
ABO/RH(D): O POS
Antibody Screen: NEGATIVE

## 2019-09-15 MED ORDER — SOD CITRATE-CITRIC ACID 500-334 MG/5ML PO SOLN: 30.0000 mL | ORAL | Status: DC | PRN

## 2019-09-15 MED ORDER — FENTANYL-BUPIVACAINE-NACL 0.5-0.125-0.9 MG/250ML-% EP SOLN
12.0000 mL/h | EPIDURAL | Status: DC | PRN
  Filled 2019-09-15: qty 250

## 2019-09-15 MED ORDER — PHENYLEPHRINE 40 MCG/ML (10ML) SYRINGE FOR IV PUSH (FOR BLOOD PRESSURE SUPPORT): 80.0000 ug | PREFILLED_SYRINGE | INTRAVENOUS | Status: DC | PRN

## 2019-09-15 MED ORDER — OXYTOCIN-SODIUM CHLORIDE 30-0.9 UT/500ML-% IV SOLN
1.0000 m[IU]/min | INTRAVENOUS | Status: DC
  Administered 2019-09-15: 4 m[IU]/min via INTRAVENOUS
  Administered 2019-09-15: 2 m[IU]/min via INTRAVENOUS
  Filled 2019-09-15: qty 500

## 2019-09-15 MED ORDER — ONDANSETRON HCL 4 MG/2ML IJ SOLN
4.0000 mg | INTRAMUSCULAR | Status: DC | PRN
  Administered 2019-09-15: 4 mg via INTRAVENOUS
  Filled 2019-09-15: qty 2

## 2019-09-15 MED ORDER — COCONUT OIL OIL
1.0000 "application " | TOPICAL_OIL | Status: DC | PRN
  Administered 2019-09-17: 1 via TOPICAL

## 2019-09-15 MED ORDER — IBUPROFEN 600 MG PO TABS
600.0000 mg | ORAL_TABLET | Freq: Four times a day (QID) | ORAL | Status: DC
  Administered 2019-09-15 – 2019-09-17 (×7): 600 mg via ORAL
  Filled 2019-09-15 (×7): qty 1

## 2019-09-15 MED ORDER — TRANEXAMIC ACID-NACL 1000-0.7 MG/100ML-% IV SOLN
INTRAVENOUS | Status: AC
  Filled 2019-09-15: qty 100

## 2019-09-15 MED ORDER — DIPHENHYDRAMINE HCL 25 MG PO CAPS: 25.0000 mg | ORAL_CAPSULE | Freq: Four times a day (QID) | ORAL | Status: DC | PRN

## 2019-09-15 MED ORDER — TRANEXAMIC ACID-NACL 1000-0.7 MG/100ML-% IV SOLN
1000.0000 mg | Freq: Once | INTRAVENOUS | Status: AC | PRN
  Administered 2019-09-15: 1000 mg via INTRAVENOUS

## 2019-09-15 MED ORDER — DIPHENHYDRAMINE HCL 50 MG/ML IJ SOLN: 12.5000 mg | INTRAMUSCULAR | Status: DC | PRN

## 2019-09-15 MED ORDER — LIDOCAINE HCL (PF) 1 % IJ SOLN
INTRAMUSCULAR | Status: DC | PRN
  Administered 2019-09-15 (×2): 4 mL via EPIDURAL

## 2019-09-15 MED ORDER — MISOPROSTOL 25 MCG QUARTER TABLET
25.0000 ug | ORAL_TABLET | ORAL | Status: DC | PRN
  Administered 2019-09-15 (×2): 25 ug via VAGINAL
  Filled 2019-09-15 (×2): qty 1

## 2019-09-15 MED ORDER — METHYLERGONOVINE MALEATE 0.2 MG/ML IJ SOLN: 0.2000 mg | INTRAMUSCULAR | Status: DC | PRN

## 2019-09-15 MED ORDER — MAGNESIUM OXIDE 400 (241.3 MG) MG PO TABS
400.0000 mg | ORAL_TABLET | Freq: Every day | ORAL | Status: DC
  Administered 2019-09-16 – 2019-09-17 (×2): 400 mg via ORAL
  Filled 2019-09-15 (×2): qty 1

## 2019-09-15 MED ORDER — METHYLERGONOVINE MALEATE 0.2 MG PO TABS
0.2000 mg | ORAL_TABLET | Freq: Four times a day (QID) | ORAL | Status: DC
  Administered 2019-09-15 – 2019-09-17 (×7): 0.2 mg via ORAL
  Filled 2019-09-15 (×6): qty 1

## 2019-09-15 MED ORDER — OXYTOCIN-SODIUM CHLORIDE 30-0.9 UT/500ML-% IV SOLN: 2.5000 [IU]/h | INTRAVENOUS | Status: DC

## 2019-09-15 MED ORDER — ACETAMINOPHEN 325 MG PO TABS: 650.0000 mg | ORAL_TABLET | ORAL | Status: DC | PRN

## 2019-09-15 MED ORDER — OXYCODONE-ACETAMINOPHEN 5-325 MG PO TABS: 2.0000 | ORAL_TABLET | ORAL | Status: DC | PRN

## 2019-09-15 MED ORDER — POLYSACCHARIDE IRON COMPLEX 150 MG PO CAPS
150.0000 mg | ORAL_CAPSULE | Freq: Every day | ORAL | Status: DC
  Administered 2019-09-16 – 2019-09-17 (×2): 150 mg via ORAL
  Filled 2019-09-15 (×2): qty 1

## 2019-09-15 MED ORDER — LACTATED RINGERS IV SOLN: 500.0000 mL | Freq: Once | INTRAVENOUS | Status: DC

## 2019-09-15 MED ORDER — LACTATED RINGERS IV SOLN
500.0000 mL | Freq: Once | INTRAVENOUS | Status: AC
  Administered 2019-09-15: 500 mL via INTRAVENOUS

## 2019-09-15 MED ORDER — LACTATED RINGERS IV SOLN: INTRAVENOUS | Status: DC

## 2019-09-15 MED ORDER — PRENATAL MULTIVITAMIN CH
1.0000 | ORAL_TABLET | Freq: Every day | ORAL | Status: DC
  Administered 2019-09-16 – 2019-09-17 (×2): 1 via ORAL
  Filled 2019-09-15 (×2): qty 1

## 2019-09-15 MED ORDER — EPHEDRINE 5 MG/ML INJ: 10.0000 mg | INTRAVENOUS | Status: DC | PRN

## 2019-09-15 MED ORDER — TERBUTALINE SULFATE 1 MG/ML IJ SOLN: 0.2500 mg | Freq: Once | INTRAMUSCULAR | Status: DC | PRN

## 2019-09-15 MED ORDER — SODIUM CHLORIDE (PF) 0.9 % IJ SOLN
INTRAMUSCULAR | Status: DC | PRN
  Administered 2019-09-15: 12 mL/h via EPIDURAL

## 2019-09-15 MED ORDER — METHYLERGONOVINE MALEATE 0.2 MG PO TABS
0.2000 mg | ORAL_TABLET | ORAL | Status: DC | PRN
  Filled 2019-09-15: qty 1

## 2019-09-15 MED ORDER — OXYTOCIN BOLUS FROM INFUSION
333.0000 mL | Freq: Once | INTRAVENOUS | Status: AC
  Administered 2019-09-15: 333 mL via INTRAVENOUS

## 2019-09-15 MED ORDER — DIBUCAINE (PERIANAL) 1 % EX OINT: 1.0000 "application " | TOPICAL_OINTMENT | CUTANEOUS | Status: DC | PRN

## 2019-09-15 MED ORDER — SIMETHICONE 80 MG PO CHEW: 80.0000 mg | CHEWABLE_TABLET | ORAL | Status: DC | PRN

## 2019-09-15 MED ORDER — ONDANSETRON HCL 4 MG PO TABS: 4.0000 mg | ORAL_TABLET | ORAL | Status: DC | PRN

## 2019-09-15 MED ORDER — PHENYLEPHRINE 40 MCG/ML (10ML) SYRINGE FOR IV PUSH (FOR BLOOD PRESSURE SUPPORT)
80.0000 ug | PREFILLED_SYRINGE | INTRAVENOUS | Status: DC | PRN
  Filled 2019-09-15: qty 10

## 2019-09-15 MED ORDER — TRANEXAMIC ACID-NACL 1000-0.7 MG/100ML-% IV SOLN: 1000.0000 mg | INTRAVENOUS | Status: DC

## 2019-09-15 MED ORDER — ZOLPIDEM TARTRATE 5 MG PO TABS: 5.0000 mg | ORAL_TABLET | Freq: Every evening | ORAL | Status: DC | PRN

## 2019-09-15 MED ORDER — LIDOCAINE HCL (PF) 1 % IJ SOLN: 30.0000 mL | INTRAMUSCULAR | Status: DC | PRN

## 2019-09-15 MED ORDER — TETANUS-DIPHTH-ACELL PERTUSSIS 5-2.5-18.5 LF-MCG/0.5 IM SUSP: 0.5000 mL | Freq: Once | INTRAMUSCULAR | Status: DC

## 2019-09-15 MED ORDER — LACTATED RINGERS IV SOLN: 500.0000 mL | INTRAVENOUS | Status: DC | PRN

## 2019-09-15 MED ORDER — OXYCODONE-ACETAMINOPHEN 5-325 MG PO TABS: 1.0000 | ORAL_TABLET | ORAL | Status: DC | PRN

## 2019-09-15 MED ORDER — BENZOCAINE-MENTHOL 20-0.5 % EX AERO
1.0000 "application " | INHALATION_SPRAY | CUTANEOUS | Status: DC | PRN
  Administered 2019-09-16 – 2019-09-17 (×2): 1 via TOPICAL
  Filled 2019-09-15 (×2): qty 56

## 2019-09-15 MED ORDER — SENNOSIDES-DOCUSATE SODIUM 8.6-50 MG PO TABS
2.0000 | ORAL_TABLET | ORAL | Status: DC
  Administered 2019-09-15 – 2019-09-16 (×2): 2 via ORAL
  Filled 2019-09-15 (×2): qty 2

## 2019-09-15 MED ORDER — WITCH HAZEL-GLYCERIN EX PADS: 1.0000 "application " | MEDICATED_PAD | CUTANEOUS | Status: DC | PRN

## 2019-09-15 MED ORDER — ONDANSETRON HCL 4 MG/2ML IJ SOLN: 4.0000 mg | Freq: Four times a day (QID) | INTRAMUSCULAR | Status: DC | PRN

## 2019-09-15 NOTE — H&P (Signed)
Laurie Reese is a 34 y.o. female presenting for Postdates IUP. OB History    Gravida  1   Para      Term      Preterm      AB      Living        SAB      TAB      Ectopic      Multiple      Live Births             Past Medical History:  Diagnosis Date  . Headache(784.0)   . HPV vaccine counseling    Gardasil series completed ...   . HSV-1 infection    Past Surgical History:  Procedure Laterality Date  . MOUTH SURGERY    . TYMPANOPLASTY    . TYMPANOSTOMY TUBE PLACEMENT    . WISDOM TOOTH EXTRACTION     Family History: family history includes Crohn's disease in her sister; Diabetes in her maternal grandfather and mother; Heart disease in her maternal grandfather and maternal grandmother; Hypertension in her maternal grandfather and mother; Prostate cancer in her maternal grandfather. Social History:  reports that she has never smoked. She has never used smokeless tobacco. She reports current alcohol use. She reports that she does not use drugs.     Maternal Diabetes: No Genetic Screening: Normal Maternal Ultrasounds/Referrals: Normal Fetal Ultrasounds or other Referrals:  None Maternal Substance Abuse:  No Significant Maternal Medications:  None Significant Maternal Lab Results:  Group B Strep negative Other Comments:  None  Review of Systems  Constitutional: Negative.   All other systems reviewed and are negative.  Maternal Medical History:  Reason for admission: Contractions.   Contractions: Onset was 3-5 hours ago.   Frequency: rare.   Perceived severity is mild.    Fetal activity: Perceived fetal activity is normal.    Prenatal complications: no prenatal complications Prenatal Complications - Diabetes: none.    Dilation: 4.5 Effacement (%): 70 Station: -2 Exam by:: Derinda Late, RN  Blood pressure 106/60, pulse 80, temperature 97.6 F (36.4 C), temperature source Oral, resp. rate 16, height 5\' 7"  (1.702 m), weight 104.5 kg, last  menstrual period 10/21/2018. Maternal Exam:  Uterine Assessment: Contraction strength is mild.  Contraction frequency is rare.   Abdomen: Patient reports no abdominal tenderness. Fetal presentation: vertex  Introitus: Normal vulva. Normal vagina.  Ferning test: not done.  Nitrazine test: not done. Amniotic fluid character: not assessed.  Pelvis: adequate for delivery.   Cervix: Cervix evaluated by digital exam.     Physical Exam Vitals and nursing note reviewed.  Constitutional:      Appearance: Normal appearance. She is normal weight.  HENT:     Head: Normocephalic and atraumatic.  Cardiovascular:     Rate and Rhythm: Normal rate and regular rhythm.     Pulses: Normal pulses.     Heart sounds: Normal heart sounds.  Pulmonary:     Effort: Pulmonary effort is normal.     Breath sounds: Normal breath sounds.  Abdominal:     General: Bowel sounds are normal.  Genitourinary:    General: Normal vulva.  Musculoskeletal:        General: Normal range of motion.     Cervical back: Normal range of motion and neck supple.  Skin:    General: Skin is warm and dry.  Neurological:     General: No focal deficit present.     Mental Status: She is alert and oriented  to person, place, and time.  Psychiatric:        Mood and Affect: Mood normal.        Behavior: Behavior normal.     Prenatal labs: ABO, Rh: --/--/O POS Performed at [facility], [address], [place], [place] [address]  [phone] Antibody: NEG (07/16 0133) Rubella: Immune (12/16 0000) RPR: Nonreactive (12/16 0000)  HBsAg: Negative (12/16 0000)  HIV: Non-reactive (12/16 0000)  GBS:     Assessment/Plan: 40+ week IUP IOL   Purity Irmen J 09/15/2019, 6:32 AM

## 2019-09-15 NOTE — Anesthesia Procedure Notes (Signed)
Epidural Patient location during procedure: OB Start time: 09/15/2019 1:45 PM End time: 09/15/2019 1:53 PM  Staffing Anesthesiologist: Mal Amabile, MD Performed: anesthesiologist   Preanesthetic Checklist Completed: patient identified, IV checked, site marked, risks and benefits discussed, surgical consent, monitors and equipment checked, pre-op evaluation and timeout performed  Epidural Patient position: sitting Prep: DuraPrep and site prepped and draped Patient monitoring: continuous pulse ox and blood pressure Approach: midline Location: L4-L5 Injection technique: LOR air  Needle:  Needle type: Tuohy  Needle gauge: 17 G Needle length: 9 cm and 9 Needle insertion depth: 7 cm Catheter type: closed end flexible Catheter size: 19 Gauge Catheter at skin depth: 12 cm Test dose: negative and Other  Assessment Events: blood not aspirated, injection not painful, no injection resistance, no paresthesia and negative IV test  Additional Notes Patient identified. Risks and benefits discussed including failed block, incomplete  Pain control, post dural puncture headache, nerve damage, paralysis, blood pressure Changes, nausea, vomiting, reactions to medications-both toxic and allergic and post Partum back pain. All questions were answered. Patient expressed understanding and wished to proceed. Sterile technique was used throughout procedure. Epidural site was Dressed with sterile barrier dressing. No paresthesias, signs of intravascular injection Or signs of intrathecal spread were encountered.  Patient was more comfortable after the epidural was dosed. Please see RN's note for documentation of vital signs and FHR which are stable. Reason for block:procedure for pain

## 2019-09-15 NOTE — Anesthesia Preprocedure Evaluation (Signed)
Anesthesia Evaluation  Patient identified by MRN, date of birth, ID band Patient awake    Reviewed: Allergy & Precautions, Patient's Chart, lab work & pertinent test results  Airway Mallampati: II  TM Distance: >3 FB Neck ROM: Full    Dental no notable dental hx. (+) Teeth Intact   Pulmonary neg pulmonary ROS,    Pulmonary exam normal breath sounds clear to auscultation       Cardiovascular negative cardio ROS Normal cardiovascular exam Rhythm:Regular Rate:Normal     Neuro/Psych  Headaches, negative psych ROS   GI/Hepatic Neg liver ROS, GERD  ,  Endo/Other  Obesity  Renal/GU negative Renal ROS  negative genitourinary   Musculoskeletal negative musculoskeletal ROS (+)   Abdominal (+) + obese,   Peds  Hematology  (+) anemia ,   Anesthesia Other Findings   Reproductive/Obstetrics (+) Pregnancy                             Anesthesia Physical Anesthesia Plan  ASA: II  Anesthesia Plan: Epidural   Post-op Pain Management:    Induction:   PONV Risk Score and Plan:   Airway Management Planned: Natural Airway  Additional Equipment:   Intra-op Plan:   Post-operative Plan:   Informed Consent: I have reviewed the patients History and Physical, chart, labs and discussed the procedure including the risks, benefits and alternatives for the proposed anesthesia with the patient or authorized representative who has indicated his/her understanding and acceptance.       Plan Discussed with: Anesthesiologist  Anesthesia Plan Comments:         Anesthesia Quick Evaluation

## 2019-09-16 LAB — CBC
HCT: 24.5 % — ABNORMAL LOW (ref 36.0–46.0)
Hemoglobin: 8.3 g/dL — ABNORMAL LOW (ref 12.0–15.0)
MCH: 30.2 pg (ref 26.0–34.0)
MCHC: 33.9 g/dL (ref 30.0–36.0)
MCV: 89.1 fL (ref 80.0–100.0)
Platelets: 178 10*3/uL (ref 150–400)
RBC: 2.75 MIL/uL — ABNORMAL LOW (ref 3.87–5.11)
RDW: 13.4 % (ref 11.5–15.5)
WBC: 16.3 10*3/uL — ABNORMAL HIGH (ref 4.0–10.5)
nRBC: 0 % (ref 0.0–0.2)

## 2019-09-16 NOTE — Anesthesia Postprocedure Evaluation (Signed)
Anesthesia Post Note  Patient: Laurie Reese  Procedure(s) Performed: AN AD HOC LABOR EPIDURAL     Patient location during evaluation: Mother Baby Anesthesia Type: Epidural Level of consciousness: awake and alert Pain management: pain level controlled Vital Signs Assessment: post-procedure vital signs reviewed and stable Respiratory status: spontaneous breathing, nonlabored ventilation and respiratory function stable Cardiovascular status: stable Postop Assessment: no headache, no backache and epidural receding Anesthetic complications: no   No complications documented.  Last Vitals:  Vitals:   09/16/19 0214 09/16/19 0625  BP: 121/68 120/81  Pulse: 79 76  Resp:  18  Temp: 36.8 C 36.7 C  SpO2: 100% 99%    Last Pain:  Vitals:   09/16/19 0625  TempSrc: Oral  PainSc: 4    Pain Goal:                   Rica Records

## 2019-09-16 NOTE — Progress Notes (Signed)
PPD # 1 S/P NSVD  Live born female  Birth Weight: 8 lb 3.2 oz (3719 g) APGAR: 9, 9  Newborn Delivery   Birth date/time: 09/15/2019 18:10:00 Delivery type: Vaginal, Spontaneous     Baby name: [facility] Delivering provider: Olivia Mackie  Episiotomy:None   Lacerations:2nd degree   Feeding: breast  Pain control at delivery: Epidural   S:  Reports feeling fine, had heavy bleeding last night and was started on methergine series, asymptomatic             Tolerating po/ No nausea or vomiting             Bleeding is moderate             Pain controlled with acetaminophen and ibuprofen (OTC)             Up ad lib / ambulatory / voiding without difficulties   O:  A & O x 3, in no apparent distress              VS:  Vitals:   09/16/19 0001 09/16/19 0120 09/16/19 0214 09/16/19 0625  BP: 110/79 107/76 121/68 120/81  Pulse: (!) 122 90 79 76  Resp:  18  18  Temp: 98.5 F (36.9 C) 97.8 F (36.6 C) 98.2 F (36.8 C) 98 F (36.7 C)  TempSrc: Axillary Oral Oral Oral  SpO2: 100% 99% 100% 99%  Weight:      Height:        LABS:  Recent Labs    09/15/19 0129 09/16/19 0416  WBC 10.5 16.3*  HGB 10.7* 8.3*  HCT 32.4* 24.5*  PLT 212 178    Blood type: --/--/O POS Performed at [facility], [address] [place], [place] [address]  [phone])  Rubella: Immune (12/16 0000)   I&O: I/O last 3 completed shifts: In: -  Out: 892 [Blood:892]          No intake/output data recorded.  Vaccines: TDaP UTD         Flu    UTD   Gen: AAO x 3, NAD  Abdomen: soft, non-tender, non-distended             Fundus: firm, non-tender, U+1  Perineum: healing, no edema or hematoma  Lochia: moderate  Extremities: no edema, no calf pain or tenderness   A/P: PPD #1 34 y.o., G1P1001   Principal Problem:   Postpartum care following vaginal delivery 7/16 Active Problems:   Encounter for induction of labor   Maternal anemia, with delivery   SVD (spontaneous vaginal delivery)   Perineal  laceration, second degree   Doing well - stable status             Continue methergine series  Continue Niferex and Mag Ox for maternal anemia  Routine post partum orders  Anticipate discharge tomorrow   June Leap, MSN, CNM 09/16/2019, 11:55 AM

## 2019-09-16 NOTE — Lactation Note (Addendum)
This note was copied from a baby's chart. Lactation Consultation Note  Patient Name: Laurie Reese Today's Date: 09/16/2019 Reason for consult: Difficult latch;Mother's request;Follow-up assessment;Term;1st time breastfeeding;Infant weight loss P1, 22 hour female infant -1% weight loss. Per parents, infant had one void and 7 stools at 2 hours of life. Mom current feeding plan is:  breast and formula feeding (Nutramigen). Mom is using her personal Medela DEBP and currently is pumping 5 mls per session. Concerns: Mom been having difficulties with infant latching on her left breast, her left breast is smaller in size and shape than her right breast, both are short shafted.  Mom uses  a manual hand pump to evert nipple shaft out more with right breast that infant latches without difficulty, on her left breast is mom's concern.  Tools given: Mom will use her Personal DEBP and pump every 3 hours for 15 minutes and start using 20 mm NS on left breast only to help with latch. LC discussed using a different feeding position with her left breast. Mom did breast stimulation prior to applying 20 mm NS on her left breast, infant latched using the cross cradle position without difficulty and BF for 22 minutes. Afterwards, dad supplemented with 10 mls of Nutramigen with iron 20 kcal ( this is parent's choice of formula).  Mom will offer her pumped or EBM first before supplementing infant with Nutramigen with iron formula.   Mom will use her Personal DEBP while dad supplements infant with a  slow flow bottle nipple based on infant's age and size (parents have supplementing guideline sheet for BF). LC reviewed importance of mom latching infant with every feeding to help  establish  her milk supply,  due to using a NS,  and  to prevent future engorgement due to missing feedings  when her milk transitions to mature breast milk. Per  mom, she understood LC suggestions.  Mom will attempt once daily to latch  infant on her left breast without NS, she understands NS is to be used for short term usage. Mom will continue to work on latching infant at breast and understands to call RN or LC if she needs assistance with latch.   Maternal Data    Feeding    LATCH Score Latch: Grasps breast easily, tongue down, lips flanged, rhythmical sucking.  Audible Swallowing: Spontaneous and intermittent  Type of Nipple: Everted at rest and after stimulation (left breast is short shafted.)  Comfort (Breast/Nipple): Soft / non-tender  Hold (Positioning): Assistance needed to correctly position infant at breast and maintain latch.  LATCH Score: 9  Interventions Interventions: DEBP;Position options;Support pillows;Adjust position;Skin to skin;Breast compression;Assisted with latch  Lactation Tools Discussed/Used Tools: Nipple Shields (NS usage on left breast only.) Nipple shield size: 20   Consult Status Consult Status: Follow-up Date: 09/17/19 Follow-up type: In-patient    Danelle Earthly 09/16/2019, 5:07 PM

## 2019-09-16 NOTE — Lactation Note (Signed)
This note was copied from a baby's chart. Lactation Consultation Note  Patient Name: Laurie Reese Today's Date: 09/16/2019 Reason for consult: 1st time breastfeeding;Difficult latch;Mother's request;Initial assessment;Term P1, 7 hour term female infant. Per mom, she has Medela DEBP at home. Mom had pp hemophage with clots of 892.   Mom's concern infant is not sustaining latch.  Tools given: hand pump due to mom being short shafted, mom will pre-pump breast prior to latch infant to help evert nipple shaft out more and help with infant sustaining latch. Mom had 5 mls of colostrum in bullet from hand expressing previously from assistance with RN Leanne Chang). Infant was cuing when [facility] entered the room. LC ask mom to do breast stimulation and express a small amount of colostrum out prior to latching infant. Infant open mouth wide latched but did not form a seal around breast, LC did suck training  on  A gloved finger, flanging infant's top lip out. Mom re-latched infant on her right breast using the football hold position, bring infant towards breast, infant sustained latch and infant was still BF after 25 minutes when LC left the room. Mom understands to call RN or LC to assist with latching infant at breast if needed. Mom will BF infant according to cues, on demand, 8 to 12+ times within 24 hours doing STS with feeding. Mom made aware of O/P services, breastfeeding support groups, community resources, and our phone # for post-discharge questions.   Maternal Data Formula Feeding for Exclusion: No Has patient been taught Hand Expression?: Yes Does the patient have breastfeeding experience prior to this delivery?: No  Feeding Feeding Type: Breast Fed  LATCH Score Latch: Grasps breast easily, tongue down, lips flanged, rhythmical sucking.  Audible Swallowing: A few with stimulation  Type of Nipple: Everted at rest and after stimulation  Comfort (Breast/Nipple): Soft / non-tender  Hold  (Positioning): Assistance needed to correctly position infant at breast and maintain latch.  LATCH Score: 8  Interventions Interventions: Breast feeding basics reviewed;Assisted with latch;Breast compression;Skin to skin;Adjust position;Breast massage;Support pillows;Hand express;Position options;Pre-pump if needed;Expressed milk;Hand pump  Lactation Tools Discussed/Used WIC Program: No Pump Review: Setup, frequency, and cleaning;Milk Storage Initiated by:: RN Date initiated:: 09/15/19   Consult Status Consult Status: Follow-up Date: 09/16/19 Follow-up type: In-patient    Danelle Earthly 09/16/2019, 1:54 AM

## 2019-09-16 NOTE — Progress Notes (Signed)
Patient passed large/many softball size clots. Triton measured . Fundus firm at UE on multiple assessments and BPs normal. Arlan Organ called and new orders placed for methergine series.

## 2019-09-17 NOTE — Lactation Note (Signed)
This note was copied from a baby's chart. Lactation Consultation Note  Patient Name: Laurie Reese Today's Date: 09/17/2019 Reason for consult: Follow-up assessment  P1 mother whose infant is now 64 hours old.  This is a term baby at 40+1 weeks.    Baby was asleep in mother's arms when I arrived.  Mother had a few concerns which we discussed.  She has been having some difficulty with latching to both breasts due to her short shafted nippled.  Mother has been using a NS.  Discussed using the NS for as long as necessary and until mother/baby feel comfortable without it.  Also encouraged manual pump for nipple eversion prior to latching.  Suggested mother continue to do a lot of breast massage and hand expression before/after feeding.  She has not been pumping after every feeding and I recommended this. Feeding plan in place for after discharge.  Engorgement prevention/treatment reviewed.  Mother has a manual pump and a DEBP for home use.  She is not sure about the suction on her own personal DEBP (which is at her bedside) and I offered to return at the next pumping session to observe and assist.  Mother will call for my assistance when ready.  Mother has been using Nutramigen (her choice) for supplementation for baby.  Discussed volumes and to always latch baby to the breast prior to supplementing.  Mother has not tried using formula in the NS tip and I provided helpful ideas to make this successful for her if she would be interested in trying.  Mother appreciative.  Father present and asleep on the couch.  Discussed ways that father can assist.  Mother has our OP phone number for questions/concerns after discharge.   Maternal Data    Feeding Feeding Type: Breast Milk with Formula added Nipple Type: Nfant Slow Flow (purple)  LATCH Score                   Interventions    Lactation Tools Discussed/Used     Consult Status Consult Status: Complete Date:  09/17/19 Follow-up type: Call as needed    Laurie Reese 09/17/2019, 9:05 AM

## 2019-09-17 NOTE — Progress Notes (Signed)
PPD # 2 S/P NSVD  Live born female  Birth Weight: 8 lb 3.2 oz (3719 g) APGAR: 9, 9  Newborn Delivery   Birth date/time: 09/15/2019 18:10:00 Delivery type: Vaginal, Spontaneous     Baby name: [facility] Delivering provider: Olivia Mackie  Episiotomy:None   Lacerations:2nd degree   Feeding: breast  Pain control at delivery: Epidural   S:  Reports feeling fine, had heavy bleeding last night and was started on methergine series, asymptomatic             Tolerating po/ No nausea or vomiting             Bleeding is moderate             Pain controlled with acetaminophen and ibuprofen (OTC)             Up ad lib / ambulatory / voiding without difficulties   O:  A & O x 3, in no apparent distress              VS:  Vitals:   09/16/19 0214 09/16/19 0625 09/16/19 1347 09/16/19 2238  BP: 121/68 120/81 112/77 107/66  Pulse: 79 76 93 86  Resp:  18 17   Temp: 98.2 F (36.8 C) 98 F (36.7 C)    TempSrc: Oral Oral    SpO2: 100% 99% 100%   Weight:      Height:        LABS:  Recent Labs    09/15/19 0129 09/16/19 0416  WBC 10.5 16.3*  HGB 10.7* 8.3*  HCT 32.4* 24.5*  PLT 212 178    Blood type: --/--/O POS Performed at [facility], [address] [place], [place] [address]  [id number])  Rubella: Immune (12/16 0000)   I&O: I/O last 3 completed shifts: In: 1236.7 [I.V.:1136.7; IV Piggyback:100] Out: 492 [Blood:492]          No intake/output data recorded.  Vaccines: TDaP UTD         Flu    UTD   Gen: AAO x 3, NAD  Abdomen: soft, non-tender, non-distended             Fundus: firm, non-tender, U+1  Perineum: healing, no edema or hematoma  Lochia: moderate  Extremities: no edema, no calf pain or tenderness   A/P: PPD #2 34 y.o., G1P1001   Principal Problem:   Postpartum care following vaginal delivery 7/16 Active Problems:   Encounter for induction of labor   Maternal anemia, with delivery   SVD (spontaneous vaginal delivery)   Perineal laceration, second  degree   Doing well - stable status  Continue Niferex and Mag Ox for maternal anemia  Routine post partum orders  Anticipate discharge today   Lenoard Aden, MSN, CNM 09/17/2019, 11:43 AM

## 2019-09-17 NOTE — Lactation Note (Signed)
This note was copied from a baby's chart. Lactation Consultation Note  Patient Name: Laurie Reese Today's Date: 09/17/2019 Reason for consult: Follow-up assessment   LC Follow Up Visit:  Mother called for latch assistance.  Baby was ready to breast feed when I arrived.  Positioned pillows appropriately and asked mother to place her NS.  Offered to pre-fill NS tip with formula to entice baby to latch and suck effectively.  Mother agreeable.  Mother latched with very little assistance from me.  Baby began sucking effectively and needed gentle stimulation to continue sucking.  With stimulation she continued sucking and eventually had good rhythmic movements.  Her lips were flanged nicely and jaw dropped well.  Mother felt a good tug but denied pain.  Baby looked very calm and I observed her feeding for approximately 12 minutes prior to leaving the room.  Mother was excited to know she latched well and is looking forward to her milk coming to volume.  Mother very appreciative of help and waiting for the doctors to arrive for discharge.   Maternal Data    Feeding    LATCH Score                   Interventions    Lactation Tools Discussed/Used     Consult Status Consult Status: Complete Date: 09/17/19 Follow-up type: Call as needed    Hendel Gatliff R Baraka Klatt 09/17/2019, 10:23 AM

## 2019-09-17 NOTE — Discharge Summary (Signed)
Postpartum Discharge Summary  Date of Service updated7/18/21     Patient Name: Laurie Reese DOB: [birth year] MRN: [id number]  Date of admission: 09/15/2019 Delivery date:09/15/2019  Delivering provider: Brien Few  Date of discharge: 09/17/2019  Admitting diagnosis: Encounter for induction of labor [Z34.90] Intrauterine pregnancy: [redacted]w[redacted]d    Secondary diagnosis:  Principal Problem:   Postpartum care following vaginal delivery 7/16 Active Problems:   Encounter for induction of labor   Maternal anemia, with delivery   SVD (spontaneous vaginal delivery)   Perineal laceration, second degree  Additional problems: none    Discharge diagnosis: Term Pregnancy Delivered                                              Post partum procedures:na Augmentation: AROM, Pitocin and Cytotec Complications: None  Hospital course: Induction of Labor With Vaginal Delivery   34y.o. yo G1P1001 at 469w1das admitted to the hospital 09/15/2019 for induction of labor.  Indication for induction: Gestational hypertension.  Patient had an uncomplicated labor course as follows: Membrane Rupture Time/Date: 8:13 AM ,09/15/2019   Delivery Method:Vaginal, Spontaneous  Episiotomy: None  Lacerations:  2nd degree  Details of delivery can be found in separate delivery note.  Patient had a routine postpartum course. Patient is discharged home 09/17/19.  Newborn Data: Birth date:09/15/2019  Birth time:6:10 PM  Gender:Female  Living status:Living  Apgars:9 ,9  Weight:3719 g   Magnesium Sulfate received: No BMZ received: No Rhophylac:N/A MMR:No T-DaP:Given prenatally Flu: N/A Transfusion:No  Physical exam  Vitals:   09/16/19 0214 09/16/19 0625 09/16/19 1347 09/16/19 2238  BP: 121/68 120/81 112/77 107/66  Pulse: 79 76 93 86  Resp:  18 17   Temp: 98.2 F (36.8 C) 98 F (36.7 C)    TempSrc: Oral Oral    SpO2: 100% 99% 100%   Weight:      Height:       General: alert, cooperative and no  distress Lochia: appropriate Uterine Fundus: firm Incision: Healing well with no significant drainage DVT Evaluation: Negative Homan's sign. No cords or calf tenderness. Labs: Lab Results  Component Value Date   WBC 16.3 (H) 09/16/2019   HGB 8.3 (L) 09/16/2019   HCT 24.5 (L) 09/16/2019   MCV 89.1 09/16/2019   PLT 178 09/16/2019   CMP Latest Ref Rng & Units 06/25/2016  Glucose 65 - 99 mg/dL 88  BUN 7 - 25 mg/dL 11  Creatinine 0.50 - 1.10 mg/dL 0.97  Sodium 135 - 146 mmol/L 140  Potassium 3.5 - 5.3 mmol/L 3.9  Chloride 98 - 110 mmol/L 105  CO2 20 - 31 mmol/L 23  Calcium 8.6 - 10.2 mg/dL 9.1  Total Protein 6.1 - 8.1 g/dL 6.9  Total Bilirubin 0.2 - 1.2 mg/dL 0.2  Alkaline Phos 33 - 115 U/L 54  AST 10 - 30 U/L 10  ALT 6 - 29 U/L 10   Edinburgh Score: Edinburgh Postnatal Depression Scale Screening Tool 09/16/2019  I have been able to laugh and see the funny side of things. 0  I have looked forward with enjoyment to things. 0  I have blamed myself unnecessarily when things went wrong. 0  I have been anxious or worried for no good reason. 1  I have felt scared or panicky for no good reason. 0  Things have been getting on top  of me. 0  I have been so unhappy that I have had difficulty sleeping. 0  I have felt sad or miserable. 0  I have been so unhappy that I have been crying. 0  The thought of harming myself has occurred to me. 0  Edinburgh Postnatal Depression Scale Total 1      After visit meds:  Allergies as of 09/17/2019      Reactions   Celexa [citalopram] Swelling   Ciprodex [ciprofloxacin-dexamethasone] Swelling      Medication List    TAKE these medications   cetirizine 10 MG tablet Commonly known as: ZYRTEC Take 10 mg by mouth daily.   neomycin-polymyxin-hydrocortisone OTIC solution Commonly known as: CORTISPORIN Place 4 drops into both ears 3 (three) times daily.   PRENATAL PO Take 1 tablet by mouth daily.        Discharge home in stable  condition Infant Feeding: Breast Infant Disposition:home with mother Discharge instruction: per After Visit Summary and Postpartum booklet. Activity: Advance as tolerated. Pelvic rest for 6 weeks.  Diet: routine diet Anticipated Birth Control: Unsure Postpartum Appointment:6 weeks Additional Postpartum F/U: na Future Appointments:No future appointments. Follow up Visit:6w      09/17/2019 Lovenia Kim, MD

## 2019-09-27 DIAGNOSIS — L0291 Cutaneous abscess, unspecified: Secondary | ICD-10-CM | POA: Diagnosis not present

## 2019-10-31 DIAGNOSIS — Z30430 Encounter for insertion of intrauterine contraceptive device: Secondary | ICD-10-CM | POA: Diagnosis not present

## 2019-10-31 DIAGNOSIS — Z1151 Encounter for screening for human papillomavirus (HPV): Secondary | ICD-10-CM | POA: Diagnosis not present

## 2019-11-07 LAB — RESULTS CONSOLE HPV: CHL HPV: NEGATIVE

## 2019-11-07 LAB — HM PAP SMEAR: HM Pap smear: NEGATIVE

## 2019-11-29 DIAGNOSIS — Z30431 Encounter for routine checking of intrauterine contraceptive device: Secondary | ICD-10-CM | POA: Diagnosis not present

## 2020-02-10 ENCOUNTER — Telehealth: Payer: 59 | Admitting: Orthopedic Surgery

## 2020-02-10 DIAGNOSIS — H10023 Other mucopurulent conjunctivitis, bilateral: Secondary | ICD-10-CM

## 2020-02-10 MED ORDER — POLYMYXIN B-TRIMETHOPRIM 10000-0.1 UNIT/ML-% OP SOLN
2.0000 [drp] | Freq: Four times a day (QID) | OPHTHALMIC | 0 refills | Status: DC
Start: 2020-02-10 — End: 2020-02-20

## 2020-02-10 NOTE — Progress Notes (Signed)
E-Visit for [employer]   We are sorry that you are not feeling well.  Here is how we plan to help!  Based on what you have shared with me it looks like you have conjunctivitis.  Conjunctivitis is a common inflammatory or infectious condition of the eye that is often referred to as "pink eye".  In most cases it is contagious (viral or bacterial). However, not all conjunctivitis requires antibiotics (ex. Allergic).  We have made appropriate suggestions for you based upon your presentation.  I have prescribed Polytrim Ophthalmic drops 2 drops 4 times a day times 5 days  Pink eye can be highly contagious.  It is typically spread through direct contact with secretions, or contaminated objects or surfaces that one may have touched.  Strict handwashing is suggested with soap and water is urged.  If not available, use alcohol based had sanitizer.  Avoid unnecessary touching of the eye.  If you wear contact lenses, you will need to refrain from wearing them until you see no white discharge from the eye for at least 24 hours after being on medication.  You should see symptom improvement in 1-2 days after starting the medication regimen.  Call us if symptoms are not improved in 1-2 days.  Home Care:  Wash your hands often!  Do not wear your contacts until you complete your treatment plan.  Avoid sharing towels, bed linen, personal items with a person who has pink eye.  See attention for anyone in your home with similar symptoms.  Get Help Right Away If:  Your symptoms do not improve.  You develop blurred or loss of vision.  Your symptoms worsen (increased discharge, pain or redness)  Your e-visit answers were reviewed by a board certified advanced clinical practitioner to complete your personal care plan.  Depending on the condition, your plan could have included both over the counter or prescription medications.  If there is a problem please reply  once you have received a response from your  provider.  Your safety is important to [place].  If you have drug allergies check your prescription carefully.    You can use MyChart to ask questions about today's visit, request a non-urgent call back, or ask for a work or school excuse for 24 hours related to this e-Visit. If it has been greater than 24 hours you will need to follow up with your provider, or enter a new e-Visit to address those concerns.   You will get an e-mail in the next two days asking about your experience.  I hope that your e-visit has been valuable and will speed your recovery. Thank you for using e-visits.        Greater than 5 minutes, yet less than 10 minutes of time have been spent researching, coordinating and implementing care for this patient today.

## 2020-02-16 ENCOUNTER — Ambulatory Visit (INDEPENDENT_AMBULATORY_CARE_PROVIDER_SITE_OTHER): Payer: 59

## 2020-02-16 ENCOUNTER — Other Ambulatory Visit: Payer: Self-pay

## 2020-02-16 DIAGNOSIS — Z23 Encounter for immunization: Secondary | ICD-10-CM | POA: Diagnosis not present

## 2020-02-19 NOTE — Progress Notes (Signed)
Subjective:    Patient ID: Laurie Reese, female    DOB: 1985/07/02, 34 y.o.   MRN: [id number]  HPI Chief Complaint  Patient presents with  . fasting cpe    Fasting cpe    She is new to the practice and here for a complete physical exam. Previous medical care: previous provider Dr. Selena Batten  Last CPE: last year   Other providers: OB/GYN- Dr. 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  Up-to-date with Covid vaccines including booster.  Tdap up-to-date.  Health maintenance:  Mammogram: N/A Colonoscopy: N/A Last Gynecological Exam: August 2021  Last Menstrual cycle: 02/09/2020 and irregular periods since IUD Last Dental Exam: Dr. Yetta Barre in October 2021 Last Eye Exam: scheduled for next week.   Wears seatbelt always, uses sunscreen, smoke detectors in home and functioning, does not text while driving and feels safe in home environment.   Reviewed allergies, medications, past medical, surgical, family, and social history.   Review of Systems Review of Systems Constitutional: -fever, -chills, -sweats, -unexpected weight change,-fatigue ENT: -runny nose, -ear pain, -sore throat Cardiology:  -chest pain, -palpitations, -edema Respiratory: -cough, -shortness of breath, -wheezing Gastroenterology: -abdominal pain, -nausea, -vomiting, -diarrhea, -constipation  Hematology: -bleeding or bruising problems Musculoskeletal: -arthralgias, -myalgias, -joint swelling, -back pain Ophthalmology: -vision changes Urology: -dysuria, -difficulty urinating, -hematuria, -urinary frequency, -urgency Neurology: -headache, -weakness, -tingling, -numbness       Objective:   Physical Exam BP 122/70   Pulse 70   Ht 5\' 7"  (1.702 m)   Wt 217 lb 6.4 oz (98.6 kg)   LMP 02/09/2020   SpO2 98%    Breastfeeding No   BMI 34.05 kg/m   General Appearance:    Alert, cooperative, no distress, appears stated age  Head:    Normocephalic, without obvious abnormality, atraumatic  Eyes:    PERRL, conjunctiva/corneas clear, EOM's intact  Ears:    Normal right TM, left TM has scarring. External ear canals  Nose:  Mask on  Throat:  Mask on  Neck:   Supple, no lymphadenopathy;  thyroid:  no   enlargement/tenderness/nodules; no JVD  Back:    Spine nontender, no curvature, ROM normal, no CVA     tenderness  Lungs:     Clear to auscultation bilaterally without wheezes, rales or     ronchi; respirations unlabored  Chest Wall:    No tenderness or deformity   Heart:    Regular rate and rhythm, S1 and S2 normal, no murmur, rub   or gallop  Breast Exam:    OB/GYN  Abdomen:     Soft, non-tender, nondistended, normoactive bowel sounds,    no masses, no hepatosplenomegaly  Genitalia:    OB/GYN  Rectal:    Not performed due to age<40 and no related complaints  Extremities:   No clubbing, cyanosis or edema  Pulses:   2+ and symmetric all extremities  Skin:   Skin color, texture, turgor normal, no rashes or lesions  Lymph nodes:   Cervical, supraclavicular, and axillary nodes normal  Neurologic:   CNII-XII intact, normal strength, sensation and gait; reflexes 2+ and symmetric throughout          Psych:   Normal mood, affect, hygiene and grooming.         Assessment & Plan:  Routine  general medical examination at a health care facility - Plan: CBC with Differential/Platelet, Comprehensive metabolic panel, TSH, T4, free, T3, Lipid panel  Screening for thyroid disorder - Plan: TSH, T4, free, T3  Screening for lipid disorders - Plan: Lipid panel  She is a pleasant 34 year old female who is new to the practice here today for fasting CPE.  She is in her usual state of health.  She is in good spirits. Preventive health care reviewed and she is up-to-date.  Counseling on healthy lifestyle including diet  and exercise particularly low-carb diet.  She does have a family history of diabetes so we discussed keeping her weight as close to normal as possible and staying active. Recommend regular dental and eye exams. Follow-up pending lab results.

## 2020-02-19 NOTE — Patient Instructions (Signed)
Preventive Care 21-32 Years Old, Female Preventive care refers to visits with your health care provider and lifestyle choices that can promote health and wellness. This includes:  A yearly physical exam. This may also be called an annual well check.  Regular dental visits and eye exams.  Immunizations.  Screening for certain conditions.  Healthy lifestyle choices, such as eating a healthy diet, getting regular exercise, not using drugs or products that contain nicotine and tobacco, and limiting alcohol use. What can I expect for my preventive care visit? Physical exam Your health care provider will check your:  Height and weight. This may be used to calculate body mass index (BMI), which tells if you are at a healthy weight.  Heart rate and blood pressure.  Skin for abnormal spots. Counseling Your health care provider may ask you questions about your:  Alcohol, tobacco, and drug use.  Emotional well-being.  Home and relationship well-being.  Sexual activity.  Eating habits.  Work and work environment.  Method of birth control.  Menstrual cycle.  Pregnancy history. What immunizations do I need?  Influenza (flu) vaccine  This is recommended every year. Tetanus, diphtheria, and pertussis (Tdap) vaccine  You may need a Td booster every 10 years. Varicella (chickenpox) vaccine  You may need this if you have not been vaccinated. Human papillomavirus (HPV) vaccine  If recommended by your health care provider, you may need three doses over 6 months. Measles, mumps, and rubella (MMR) vaccine  You may need at least one dose of MMR. You may also need a second dose. Meningococcal conjugate (MenACWY) vaccine  One dose is recommended if you are age 19-21 years and a first-year college student living in a residence hall, or if you have one of several medical conditions. You may also need additional booster doses. Pneumococcal conjugate (PCV13) vaccine  You may need  this if you have certain conditions and were not previously vaccinated. Pneumococcal polysaccharide (PPSV23) vaccine  You may need one or two doses if you smoke cigarettes or if you have certain conditions. Hepatitis A vaccine  You may need this if you have certain conditions or if you travel or work in places where you may be exposed to hepatitis A. Hepatitis B vaccine  You may need this if you have certain conditions or if you travel or work in places where you may be exposed to hepatitis B. Haemophilus influenzae type b (Hib) vaccine  You may need this if you have certain conditions. You may receive vaccines as individual doses or as more than one vaccine together in one shot (combination vaccines). Talk with your health care provider about the risks and benefits of combination vaccines. What tests do I need?  Blood tests  Lipid and cholesterol levels. These may be checked every 5 years starting at age 20.  Hepatitis C test.  Hepatitis B test. Screening  Diabetes screening. This is done by checking your blood sugar (glucose) after you have not eaten for a while (fasting).  Sexually transmitted disease (STD) testing.  BRCA-related cancer screening. This may be done if you have a family history of breast, ovarian, tubal, or peritoneal cancers.  Pelvic exam and Pap test. This may be done every 3 years starting at age 21. Starting at age 30, this may be done every 5 years if you have a Pap test in combination with an HPV test. Talk with your health care provider about your test results, treatment options, and if necessary, the need for more tests.   Follow these instructions at home: Eating and drinking   Eat a diet that includes fresh fruits and vegetables, whole grains, lean protein, and low-fat dairy.  Take vitamin and mineral supplements as recommended by your health care provider.  Do not drink alcohol if: ? Your health care provider tells you not to drink. ? You are  pregnant, may be pregnant, or are planning to become pregnant.  If you drink alcohol: ? Limit how much you have to 0-1 drink a day. ? Be aware of how much alcohol is in your drink. In the [place] one drink equals one 12 oz bottle of beer (355 mL), one 5 oz glass of wine (148 mL), or one 1 oz glass of hard liquor (44 mL). Lifestyle  Take daily care of your teeth and gums.  Stay active. Exercise for at least 30 minutes on 5 or more days each week.  Do not use any products that contain nicotine or tobacco, such as cigarettes, e-cigarettes, and chewing tobacco. If you need help quitting, ask your health care provider.  If you are sexually active, practice safe sex. Use a condom or other form of birth control (contraception) in order to prevent pregnancy and STIs (sexually transmitted infections). If you plan to become pregnant, see your health care provider for a preconception visit. What's next?  Visit your health care provider once a year for a well check visit.  Ask your health care provider how often you should have your eyes and teeth checked.  Stay up to date on all vaccines. This information is not intended to replace advice given to you by your health care provider. Make sure you discuss any questions you have with your health care provider. Document Revised: 10/28/2017 Document Reviewed: 10/28/2017 Elsevier Patient Education  2020 [employer].

## 2020-02-20 ENCOUNTER — Encounter: Payer: Self-pay | Admitting: Family Medicine

## 2020-02-20 ENCOUNTER — Other Ambulatory Visit: Payer: Self-pay

## 2020-02-20 ENCOUNTER — Ambulatory Visit (INDEPENDENT_AMBULATORY_CARE_PROVIDER_SITE_OTHER): Payer: 59 | Admitting: Family Medicine

## 2020-02-20 VITALS — BP 122/70 | HR 70 | Ht 67.0 in | Wt 217.4 lb

## 2020-02-20 DIAGNOSIS — Z1329 Encounter for screening for other suspected endocrine disorder: Secondary | ICD-10-CM | POA: Diagnosis not present

## 2020-02-20 DIAGNOSIS — Z13220 Encounter for screening for lipoid disorders: Secondary | ICD-10-CM

## 2020-02-20 DIAGNOSIS — Z0000 Encounter for general adult medical examination without abnormal findings: Secondary | ICD-10-CM

## 2020-02-21 ENCOUNTER — Other Ambulatory Visit: Payer: Self-pay | Admitting: Internal Medicine

## 2020-02-21 DIAGNOSIS — E0590 Thyrotoxicosis, unspecified without thyrotoxic crisis or storm: Secondary | ICD-10-CM

## 2020-02-21 LAB — COMPREHENSIVE METABOLIC PANEL
ALT: 23 IU/L (ref 0–32)
AST: 17 IU/L (ref 0–40)
Albumin/Globulin Ratio: 1.4 (ref 1.2–2.2)
Albumin: 4.1 g/dL (ref 3.8–4.8)
Alkaline Phosphatase: 84 IU/L (ref 44–121)
BUN/Creatinine Ratio: 15 (ref 9–23)
BUN: 11 mg/dL (ref 6–20)
Bilirubin Total: 0.2 mg/dL (ref 0.0–1.2)
CO2: 20 mmol/L (ref 20–29)
Calcium: 9.3 mg/dL (ref 8.7–10.2)
Chloride: 104 mmol/L (ref 96–106)
Creatinine, Ser: 0.73 mg/dL (ref 0.57–1.00)
GFR calc Af Amer: 126 mL/min/{1.73_m2} (ref >59)
GFR calc non Af Amer: 109 mL/min/{1.73_m2} (ref >59)
Globulin, Total: 2.9 g/dL (ref 1.5–4.5)
Glucose: 87 mg/dL (ref 65–99)
Potassium: 4.4 mmol/L (ref 3.5–5.2)
Sodium: 138 mmol/L (ref 134–144)
Total Protein: 7 g/dL (ref 6.0–8.5)

## 2020-02-21 LAB — CBC WITH DIFFERENTIAL/PLATELET
Basophils Absolute: 0 10*3/uL (ref 0.0–0.2)
Basos: 1 %
EOS (ABSOLUTE): 0.1 10*3/uL (ref 0.0–0.4)
Eos: 2 %
Hematocrit: 38.3 % (ref 34.0–46.6)
Hemoglobin: 11.9 g/dL (ref 11.1–15.9)
Immature Grans (Abs): 0 10*3/uL (ref 0.0–0.1)
Immature Granulocytes: 0 %
Lymphocytes Absolute: 1.6 10*3/uL (ref 0.7–3.1)
Lymphs: 33 %
MCH: 23.2 pg — ABNORMAL LOW (ref 26.6–33.0)
MCHC: 31.1 g/dL — ABNORMAL LOW (ref 31.5–35.7)
MCV: 75 fL — ABNORMAL LOW (ref 79–97)
Monocytes Absolute: 0.7 10*3/uL (ref 0.1–0.9)
Monocytes: 14 %
Neutrophils Absolute: 2.4 10*3/uL (ref 1.4–7.0)
Neutrophils: 50 %
Platelets: 301 10*3/uL (ref 150–450)
RBC: 5.14 x10E6/uL (ref 3.77–5.28)
RDW: 17.1 % — ABNORMAL HIGH (ref 11.7–15.4)
WBC: 4.8 10*3/uL (ref 3.4–10.8)

## 2020-02-21 LAB — TSH: TSH: <0.005 u[IU]/mL — ABNORMAL LOW (ref 0.450–4.500)

## 2020-02-21 LAB — LIPID PANEL
Chol/HDL Ratio: 4.1 ratio (ref 0.0–4.4)
Cholesterol, Total: 128 mg/dL (ref 100–199)
HDL: 31 mg/dL — ABNORMAL LOW (ref >39)
LDL Chol Calc (NIH): 71 mg/dL (ref 0–99)
Triglycerides: 149 mg/dL (ref 0–149)
VLDL Cholesterol Cal: 26 mg/dL (ref 5–40)

## 2020-02-21 LAB — T3: T3, Total: 213 ng/dL — ABNORMAL HIGH (ref 71–180)

## 2020-02-21 LAB — T4, FREE: Free T4: 2.75 ng/dL — ABNORMAL HIGH (ref 0.82–1.77)

## 2020-02-21 NOTE — Progress Notes (Signed)
Please refer her to endocrinology for hyperthyroidism.

## 2020-02-26 ENCOUNTER — Ambulatory Visit
Admission: RE | Admit: 2020-02-26 | Discharge: 2020-02-26 | Disposition: A | Discharge status: Home / Self Care | Payer: 59 | Source: Ambulatory Visit | Attending: Family Medicine | Admitting: Family Medicine

## 2020-02-26 DIAGNOSIS — E0590 Thyrotoxicosis, unspecified without thyrotoxic crisis or storm: Secondary | ICD-10-CM | POA: Diagnosis not present

## 2020-02-27 ENCOUNTER — Other Ambulatory Visit: Payer: 59

## 2020-02-27 ENCOUNTER — Other Ambulatory Visit: Payer: Self-pay | Admitting: Internal Medicine

## 2020-02-27 ENCOUNTER — Other Ambulatory Visit: Payer: Self-pay

## 2020-02-27 DIAGNOSIS — O905 Postpartum thyroiditis: Secondary | ICD-10-CM

## 2020-02-27 DIAGNOSIS — E060 Acute thyroiditis: Secondary | ICD-10-CM

## 2020-02-27 DIAGNOSIS — E0590 Thyrotoxicosis, unspecified without thyrotoxic crisis or storm: Secondary | ICD-10-CM | POA: Diagnosis not present

## 2020-02-27 NOTE — Progress Notes (Signed)
Her thyroid US does not show a nodule but suggests she has acute thyroiditis. This needs to be evaluated by endocrinology more urgently than February. Please see if someone can work her in.

## 2020-02-28 ENCOUNTER — Other Ambulatory Visit: Payer: 59

## 2020-02-28 ENCOUNTER — Telehealth: Payer: Self-pay | Admitting: Internal Medicine

## 2020-02-28 ENCOUNTER — Other Ambulatory Visit: Payer: Self-pay | Admitting: Internal Medicine

## 2020-02-28 DIAGNOSIS — E060 Acute thyroiditis: Secondary | ICD-10-CM

## 2020-02-28 DIAGNOSIS — O905 Postpartum thyroiditis: Secondary | ICD-10-CM

## 2020-02-28 LAB — CBC WITH DIFFERENTIAL/PLATELET
Basophils Absolute: 0 10*3/uL (ref 0.0–0.2)
Basos: 0 %
EOS (ABSOLUTE): 0.1 10*3/uL (ref 0.0–0.4)
Eos: 1 %
Hematocrit: 35.7 % (ref 34.0–46.6)
Hemoglobin: 11.4 g/dL (ref 11.1–15.9)
Immature Grans (Abs): 0 10*3/uL (ref 0.0–0.1)
Immature Granulocytes: 0 %
Lymphocytes Absolute: 2.6 10*3/uL (ref 0.7–3.1)
Lymphs: 25 %
MCH: 23.9 pg — ABNORMAL LOW (ref 26.6–33.0)
MCHC: 31.9 g/dL (ref 31.5–35.7)
MCV: 75 fL — ABNORMAL LOW (ref 79–97)
Monocytes Absolute: 0.9 10*3/uL (ref 0.1–0.9)
Monocytes: 9 %
Neutrophils Absolute: 6.5 10*3/uL (ref 1.4–7.0)
Neutrophils: 65 %
Platelets: 334 10*3/uL (ref 150–450)
RBC: 4.77 x10E6/uL (ref 3.77–5.28)
RDW: 16.8 % — ABNORMAL HIGH (ref 11.7–15.4)
WBC: 10.2 10*3/uL (ref 3.4–10.8)

## 2020-02-28 LAB — THYROID PEROXIDASE ANTIBODY: Thyroperoxidase Ab SerPl-aCnc: 157 IU/mL — ABNORMAL HIGH (ref 0–34)

## 2020-02-28 LAB — T4, FREE: Free T4: 2.24 ng/dL — ABNORMAL HIGH (ref 0.82–1.77)

## 2020-02-28 LAB — T3, FREE: T3, Free: 5.8 pg/mL — ABNORMAL HIGH (ref 2.0–4.4)

## 2020-02-28 LAB — TSH: TSH: <0.005 u[IU]/mL — ABNORMAL LOW (ref 0.450–4.500)

## 2020-02-28 LAB — SEDIMENTATION RATE: Sed Rate: 12 mm/hr (ref 0–32)

## 2020-02-28 NOTE — Progress Notes (Signed)
Please let her know that I sent a message to [facility]. In the meantime, let me know if she has any new symptoms.

## 2020-02-28 NOTE — Telephone Encounter (Signed)
-----   Message from Avanell Shackleton, NP-C sent at 02/28/2020 11:30 AM EST ----- Regarding: FW: new patient Please order the thyroid uptake and scan and see if Byrd Hesselbach can add TSI to the blood per Dr. Charlean Sanfilippo recommendations. She has agreed to take her as a patient. Thanks.  ----- Message ----- From: Carlus Pavlov, MD Sent: 02/28/2020  11:07 AM EST To: Avanell Shackleton, NP-C Subject: RE: new patient                                Hi Vickie, I accepted her as a patient, but I will need to see her before making recommendations for treatment, since I am not sure if this is a Graves' disease case or subacute thyroiditis.  The one thing that I could recommend for now is a thyroid uptake and scan, and, if possible, addition of TSI to the blood already in the lab. Thank you for the referral! 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 I am requesting recommendations or to have her seen in your office to assist with the findings of postpartum hyperthyroidism. Please let me know if you have any suggestions or what studies you might want done before she is seen there and I will be happy to order them.  Thank you,  Hetty Blend, NP-C

## 2020-02-28 NOTE — Telephone Encounter (Signed)
Pt has appt with endocrinology next week as well

## 2020-02-28 NOTE — Telephone Encounter (Signed)
Pt is scheduled on this is a 2 day test. first available is January 4th @ 10:30am, and then January 5th @ 10:30am arriving both days at 10am to [facility]. nothing to eat or drink 4 hours prior. No prior Berkley Harvey is needed with current insurance. Pt will get new insurance on 1/1

## 2020-02-28 NOTE — Telephone Encounter (Signed)
Pt will have [facility] starting 1/1  No prior Berkley Harvey is needed as they gave me a referr # [id number]   Referernce # [id number]

## 2020-02-29 ENCOUNTER — Ambulatory Visit: Payer: 59 | Admitting: Family Medicine

## 2020-02-29 LAB — SPECIMEN STATUS REPORT

## 2020-02-29 LAB — THYROID STIMULATING IMMUNOGLOBULIN: Thyroid Stim Immunoglobulin: <0.1 IU/L (ref 0.00–0.55)

## 2020-03-05 ENCOUNTER — Encounter (HOSPITAL_COMMUNITY): Payer: No Typology Code available for payment source

## 2020-03-05 ENCOUNTER — Encounter (HOSPITAL_COMMUNITY): Admission: RE | Admit: 2020-03-05 | Payer: No Typology Code available for payment source | Source: Ambulatory Visit

## 2020-03-05 ENCOUNTER — Other Ambulatory Visit (HOSPITAL_COMMUNITY): Payer: 59

## 2020-03-06 ENCOUNTER — Encounter (HOSPITAL_COMMUNITY): Payer: No Typology Code available for payment source

## 2020-03-06 ENCOUNTER — Encounter (HOSPITAL_COMMUNITY)
Admission: RE | Admit: 2020-03-06 | Discharge: 2020-03-06 | Disposition: A | Discharge status: Home / Self Care | Payer: No Typology Code available for payment source | Source: Ambulatory Visit | Attending: Family Medicine | Admitting: Family Medicine

## 2020-03-06 ENCOUNTER — Other Ambulatory Visit: Payer: Self-pay

## 2020-03-06 ENCOUNTER — Ambulatory Visit (HOSPITAL_COMMUNITY)
Admission: RE | Admit: 2020-03-06 | Discharge: 2020-03-06 | Disposition: A | Discharge status: Home / Self Care | Payer: No Typology Code available for payment source | Source: Ambulatory Visit | Attending: Family Medicine | Admitting: Family Medicine

## 2020-03-06 DIAGNOSIS — E060 Acute thyroiditis: Secondary | ICD-10-CM | POA: Diagnosis present

## 2020-03-06 DIAGNOSIS — O905 Postpartum thyroiditis: Secondary | ICD-10-CM | POA: Diagnosis present

## 2020-03-06 MED ORDER — SODIUM IODIDE I-123 7.4 MBQ CAPS
438.0000 | ORAL_CAPSULE | Freq: Once | ORAL | Status: AC
  Administered 2020-03-06: 438 via ORAL

## 2020-03-07 ENCOUNTER — Ambulatory Visit: Payer: No Typology Code available for payment source | Admitting: Internal Medicine

## 2020-03-07 ENCOUNTER — Encounter: Payer: Self-pay | Admitting: Internal Medicine

## 2020-03-07 ENCOUNTER — Encounter (HOSPITAL_COMMUNITY)
Admission: RE | Admit: 2020-03-07 | Discharge: 2020-03-07 | Disposition: A | Discharge status: Home / Self Care | Payer: No Typology Code available for payment source | Source: Ambulatory Visit | Attending: Family Medicine | Admitting: Family Medicine

## 2020-03-07 VITALS — BP 120/78 | HR 84 | Ht 67.0 in | Wt 220.2 lb

## 2020-03-07 DIAGNOSIS — E0590 Thyrotoxicosis, unspecified without thyrotoxic crisis or storm: Secondary | ICD-10-CM

## 2020-03-07 NOTE — Patient Instructions (Addendum)
I will get back to you about the results of the thyroid uptake and scan.  If they are consistent with Graves' disease, we will need to start a low-dose methimazole.  Please come back for a follow-up appointment in 3 months.   Hyperthyroidism  Hyperthyroidism is when the thyroid gland is too active (overactive). The thyroid gland is a small gland located in the lower front part of the neck, just in front of the windpipe (trachea). This gland makes hormones that help control how the body uses food for energy (metabolism) as well as how the heart and brain function. These hormones also play a role in keeping your bones strong. When the thyroid is overactive, it produces too much of a hormone called thyroxine. What are the causes? This condition may be caused by:  Graves' disease. This is a disorder in which the body's disease-fighting system (immune system) attacks the thyroid gland. This is the most common cause.  Inflammation of the thyroid gland.  A tumor in the thyroid gland.  Use of certain medicines, including: ? Prescription thyroid hormone replacement. ? Herbal supplements that mimic thyroid hormones. ? Amiodarone therapy.  Solid or fluid-filled lumps within your thyroid gland (thyroid nodules).  Taking in a large amount of iodine from foods or medicines. What increases the risk? You are more likely to develop this condition if:  You are female.  You have a family history of thyroid conditions.  You smoke tobacco.  You use a medicine called lithium.  You take medicines that affect the immune system (immunosuppressants). What are the signs or symptoms? Symptoms of this condition include:  Nervousness.  Inability to tolerate heat.  Unexplained weight loss.  Diarrhea.  Change in the texture of hair or skin.  Heart skipping beats or making extra beats.  Rapid heart rate.  Loss of menstruation.  Shaky hands.  Fatigue.  Restlessness.  Sleep  problems.  Enlarged thyroid gland or a lump in the thyroid (nodule). You may also have symptoms of Graves' disease, which may include:  Protruding eyes.  Dry eyes.  Red or swollen eyes.  Problems with vision. How is this diagnosed? This condition may be diagnosed based on:  Your symptoms and medical history.  A physical exam.  Blood tests.  Thyroid ultrasound. This test involves using sound waves to produce images of the thyroid gland.  A thyroid scan. A radioactive substance is injected into a vein, and images show how much iodine is present in the thyroid.  Radioactive iodine uptake test (RAIU). A small amount of radioactive iodine is given by mouth to see how much iodine the thyroid absorbs after a certain amount of time. How is this treated? Treatment depends on the cause and severity of the condition. Treatment may include:  Medicines to reduce the amount of thyroid hormone your body makes.  Radioactive iodine treatment (radioiodine therapy). This involves swallowing a small dose of radioactive iodine, in capsule or liquid form, to kill thyroid cells.  Surgery to remove part or all of your thyroid gland. You may need to take thyroid hormone replacement medicine for the rest of your life after thyroid surgery.  Medicines to help manage your symptoms. Follow these instructions at home:   Take over-the-counter and prescription medicines only as told by your health care provider.  Do not use any products that contain nicotine or tobacco, such as cigarettes and e-cigarettes. If you need help quitting, ask your health care provider.  Follow any instructions from your health care  provider about diet. You may be instructed to limit foods that contain iodine.  Keep all follow-up visits as told by your health care provider. This is important. ? You will need to have blood tests regularly so that your health care provider can monitor your condition. Contact a health care  provider if:  Your symptoms do not get better with treatment.  You have a fever.  You are taking thyroid hormone replacement medicine and you: ? Have symptoms of depression. ? Feel like you are tired all the time. ? Gain weight. Get help right away if:  You have chest pain.  You have decreased alertness or a change in your awareness.  You have abdominal pain.  You feel dizzy.  You have a rapid heartbeat.  You have an irregular heartbeat.  You have difficulty breathing. Summary  The thyroid gland is a small gland located in the lower front part of the neck, just in front of the windpipe (trachea).  Hyperthyroidism is when the thyroid gland is too active (overactive) and produces too much of a hormone called thyroxine.  The most common cause is Graves' disease, a disorder in which your immune system attacks the thyroid gland.  Hyperthyroidism can cause various symptoms, such as unexplained weight loss, nervousness, inability to tolerate heat, or changes in your heartbeat.  Treatment may include medicine to reduce the amount of thyroid hormone your body makes, radioiodine therapy, surgery, or medicines to manage symptoms. This information is not intended to replace advice given to you by your health care provider. Make sure you discuss any questions you have with your health care provider. Document Revised: 01/29/2017 Document Reviewed: 01/27/2017 Elsevier Patient Education  2020 [employer].

## 2020-03-07 NOTE — Progress Notes (Signed)
Patient ID: Laurie Reese, female   DOB: [birth year], 35 y.o.   MRN: [id number]  This visit occurred during the SARS-CoV-2 public health emergency.  Safety protocols were in place, including screening questions prior to the visit, additional usage of staff PPE, and extensive cleaning of exam room while observing appropriate contact time as indicated for disinfecting solutions.   HPI  Laurie Reese is a 35 y.o.-year-old female, referred by her PCP, Raenette Rover, Vickie L, NP-C, for evaluation and management of thyrotoxicosis.  Patient gave birth on 09/15/2019 to healthy baby.  She was found to be thyrotoxic on labs checked in the postpartum.  She did not have any thyrotoxic symptoms at that time and remains asymptomatic today.  She was previously on prenatal vitamins, but stopped in 10/2019.  She had a sinus infection last week >> on Zithromax >> now off.  I reviewed pt's thyroid tests: Lab Results  Component Value Date   TSH <0.005 (L) 02/27/2020   TSH <0.005 (L) 02/20/2020   TSH 1.42 06/25/2016   TSH 1.15 06/24/2015   TSH 1.150 06/21/2014   TSH 1.229 06/15/2013   TSH 1.056 06/09/2012   TSH 0.961 05/13/2011   FREET4 2.24 (H) 02/27/2020   FREET4 2.75 (H) 02/20/2020   T3FREE 5.8 (H) 02/27/2020   TPO antithyroid antibodies were elevated, while Graves' antibodies (TSI) were not: Component     Latest Ref Rng & Units 02/27/2020  Thyroperoxidase Ab SerPl-aCnc     0 - 34 IU/mL 157 (H)  Thyroid Stim Immunoglobulin     0.00 - 0.55 IU/L <0.10   ESR was not elevated: Component     Latest Ref Rng & Units 02/27/2020  Sed Rate     0 - 32 mm/hr 12    Thyroid ultrasound (02/26/2020) showed markedly heterogeneous gland without nodules, consistent with thyroiditis  Pt denies: - feeling nodules in neck - hoarseness - dysphagia - choking - SOB with lying down  She denies: - fatigue - excessive sweating/heat intolerance - tremors - anxiety - palpitations - hyperdefecation - Abnormal  weight loss - hair loss  Pt has a FH of thyroid ds.: thyroid nodules in mother. No FH of thyroid cancer. No h/o radiation tx to head or neck.  No seaweed or kelp, no recent contrast studies. No steroid use. No herbal supplements. No Biotin use.  Pt. also has a history of HAs.  She has an IUD.  She is a Quarry manager at [employer].  ROS: Constitutional: + see HPI Eyes: no blurry vision, no xerophthalmia ENT: no sore throat, + see HPI Cardiovascular: no CP/SOB/palpitations/leg swelling Respiratory: no cough/SOB Gastrointestinal: no N/V/D/C Musculoskeletal: no muscle/joint aches Skin: no rashes Neurological: no tremors/numbness/tingling/dizziness Psychiatric: no depression/anxiety  Past Medical History:  Diagnosis Date  . Headache(784.0)   . HPV vaccine counseling    Gardasil series completed ...   . HSV-1 infection    Past Surgical History:  Procedure Laterality Date  . MOUTH SURGERY    . TYMPANOPLASTY    . TYMPANOSTOMY TUBE PLACEMENT    . WISDOM TOOTH EXTRACTION     Social History   Socioeconomic History  . Marital status: Married    Spouse name: Not on file  . Number of children: 1  . Years of education: Not on file  . Highest education level: Not on file  Occupational History  . Occupation: [employer]   Tobacco Use  . Smoking status: Never Smoker  . Smokeless tobacco: Never Used  Vaping Use  .  Vaping Use: Never used  Substance and Sexual Activity  . Alcohol use: Not Currently    Alcohol/week: 0.0 standard drinks  . Drug use: No  . Sexual activity: Yes    Partners: Male    Birth control/protection: I.U.D.    Comment: NUVARING. 1st intercourse- 19, partners- 3  Other Topics Concern  . Not on file  Social History Narrative  . Not on file   Social Determinants of Health   Financial Resource Strain: Not on file  Food Insecurity: Not on file  Transportation Needs: Not on file  Physical Activity: Not on file  Stress: Not on file   Social Connections: Not on file  Intimate Partner Violence: Not on file   Current Outpatient Medications on File Prior to Visit  Medication Sig Dispense Refill  . IUD'S IU by Intrauterine route.     No current facility-administered medications on file prior to visit.   Allergies  Allergen Reactions  . Celexa [Citalopram] Swelling    Swelling on face  . Ciprodex [Ciprofloxacin-Dexamethasone] Swelling    Swelling on face   Family History  Problem Relation Age of Onset  . Hypertension Mother   . Diabetes Mother   . Heart disease Maternal Grandmother   . Diabetes Maternal Grandfather   . Hypertension Maternal Grandfather   . Heart disease Maternal Grandfather   . Prostate cancer Maternal Grandfather   . Crohn's disease Sister   . Diabetes Father   . Colon cancer Neg Hx    PE: BP 120/78   Pulse 84   Ht _0  (1.702 m)   Wt 220 lb 3.2 oz (99.9 kg)   LMP 02/09/2020   SpO2 98%   BMI 34.49 kg/m  Wt Readings from Last 3 Encounters:  03/07/20 220 lb 3.2 oz (99.9 kg)  02/20/20 217 lb 6.4 oz (98.6 kg)  09/15/19 230 lb 7 oz (104.5 kg)   Constitutional: overweight, in NAD Eyes: PERRLA, EOMI, no exophthalmos, no lid lag, no stare ENT: moist mucous membranes, no thyromegaly, no thyroid bruits, no cervical lymphadenopathy Cardiovascular: RRR, No MRG Respiratory: CTA B Gastrointestinal: abdomen soft, NT, ND, BS+ Musculoskeletal: no deformities, strength intact in all 4 Skin: moist, warm, no rashes Neurological: no tremor with outstretched hands, DTR normal in all 4  ASSESSMENT: 1. Thyrotoxicosis  PLAN:  1. Patient with a recently found low TSH, without thyrotoxic sxs: weight loss, heat intolerance, hyperdefecation, palpitations, anxiety.  - she does not appear to have exogenous causes for the low TSH.  - We discussed that possible causes of thyrotoxicosis are:  Marland Kitchen Graves ds  (less likely since TSI's were normal) . Thyroiditis (most likely, since she was told that her  thyroid uptake was really low -final results of the thyroid uptake and scan pending).  We discussed that this could be postpartum or related to an upper respiratory infection. Marland Kitchen toxic multinodular goiter/ toxic adenoma (no nodules present on her recent thyroid ultrasound). - she had the first part of the thyroid uptake and scan yesterday and will have the second part today (final results pending) which will help differentiate between the 3 above possible etiologies  - we discussed about possible modalities of treatment for the above conditions, to include methimazole use, radioactive iodine ablation or (last resort) surgery. - we discussed about natural evolution of thyroiditis mostly towards restoration of euthyroidism, but occasionally complicated by hypothyroidism.  That is why we need to continue to follow her thyroid tests over the next several months. - I do  not feel that we need to add beta blockers at this time, since she is not tachycardic or tremulous - no signs of Graves' ophthalmopathy: she does not have any double vision, blurry vision, eye pain, chemosis. - RTC in 3 months, but in 4-5 weeks for repeat labs  CLINICAL DATA:  Postpartum thyroiditis.  EXAM: THYROID SCAN AND UPTAKE - 4 AND 24 HOURS  TECHNIQUE: Following oral administration of I-123 capsule, anterior planar imaging was acquired at 24 hours. Thyroid uptake was calculated with a thyroid probe at 4-6 hours and 24 hours.  RADIOPHARMACEUTICALS:  438.0 UCi I-123 sodium iodide p.o.  COMPARISON:  Thyroid ultrasound 03/06/2020  FINDINGS: The thyroid gland shows minimal uptake and is poorly visualized. Moderate heterogeneous uptake.  4 hour I-123 uptake = 0.9%% (normal 5-20%)  24 hour I-123 uptake = 0.4%% (normal 10-30%)  IMPRESSION: Findings consistent with acute or subacute thyroiditis.  Results confirming subacute thyroiditis.  Since she is not symptomatic, no intervention is needed for now, but I would  like to recheck her test in 4 weeks.  Orders Placed This Encounter  Procedures  . TSH  . T4, free  . T3, free   Philemon Kingdom, MD PhD [facility]

## 2020-03-07 NOTE — Progress Notes (Signed)
Nothing alarming on [place]. Ok to send to Dr. Elvera Lennox if needed.

## 2020-03-11 ENCOUNTER — Encounter: Payer: Self-pay | Admitting: Internal Medicine

## 2020-04-11 ENCOUNTER — Ambulatory Visit: Payer: 59 | Admitting: Endocrinology

## 2020-05-19 ENCOUNTER — Encounter: Payer: Self-pay | Admitting: Internal Medicine

## 2020-05-29 ENCOUNTER — Other Ambulatory Visit: Payer: Self-pay

## 2020-05-29 ENCOUNTER — Other Ambulatory Visit (INDEPENDENT_AMBULATORY_CARE_PROVIDER_SITE_OTHER): Payer: No Typology Code available for payment source

## 2020-05-29 DIAGNOSIS — E0590 Thyrotoxicosis, unspecified without thyrotoxic crisis or storm: Secondary | ICD-10-CM

## 2020-05-29 LAB — T4, FREE: Free T4: 0.81 ng/dL (ref 0.60–1.60)

## 2020-05-29 LAB — T3, FREE: T3, Free: 2.8 pg/mL (ref 2.3–4.2)

## 2020-05-29 LAB — TSH: TSH: 8.33 u[IU]/mL — ABNORMAL HIGH (ref 0.35–4.50)

## 2020-06-06 ENCOUNTER — Encounter: Payer: Self-pay | Admitting: Internal Medicine

## 2020-06-07 ENCOUNTER — Other Ambulatory Visit: Payer: Self-pay

## 2020-06-07 ENCOUNTER — Encounter: Payer: Self-pay | Admitting: Internal Medicine

## 2020-06-07 ENCOUNTER — Ambulatory Visit (INDEPENDENT_AMBULATORY_CARE_PROVIDER_SITE_OTHER): Payer: No Typology Code available for payment source | Admitting: Internal Medicine

## 2020-06-07 VITALS — BP 130/82 | HR 81 | Ht 67.0 in | Wt 222.2 lb

## 2020-06-07 DIAGNOSIS — O905 Postpartum thyroiditis: Secondary | ICD-10-CM | POA: Diagnosis not present

## 2020-06-07 HISTORY — DX: Postpartum thyroiditis: O90.5

## 2020-06-07 NOTE — Progress Notes (Signed)
Patient ID: [name], female   DOB: [birth year], 35 y.o.   MRN: [id number]  This visit occurred during the SARS-CoV-2 public health emergency.  Safety protocols were in place, including screening questions prior to the visit, additional usage of staff PPE, and extensive cleaning of exam room while observing appropriate contact time as indicated for disinfecting solutions.   HPI  Laurie Reese is a 35 y.o.-year-old female, initially referred by her PCP, Laurie Reese, Laurie L, NP-C, returning for f/u for postpartum thyroiditis.  Last OV 3 months ago.  Reviewed and addended hx: Patient gave birth on 09/15/2019 to healthy baby.  She was found to be thyrotoxic on labs checked in the postpartum.  She did not have any thyrotoxic symptoms at that time and remained asymptomatic afterwards.  Since last visit, she had another set of thyroid tests checked at the end of last month and a TSH was elevated, while the free thyroid hormones were normal.  At This visit, she describes weight gain (although per our scale, she gained only 2 pounds at last visit).  I reviewed pt's thyroid tests: Lab Results  Component Value Date   TSH 8.33 (H) 05/29/2020   TSH <0.005 (Reese) 02/27/2020   TSH <0.005 (Reese) 02/20/2020   TSH 1.42 06/25/2016   TSH 1.15 06/24/2015   TSH 1.150 06/21/2014   TSH 1.229 06/15/2013   TSH 1.056 06/09/2012   TSH 0.961 05/13/2011   FREET4 0.81 05/29/2020   FREET4 2.24 (H) 02/27/2020   FREET4 2.75 (H) 02/20/2020   T3FREE 2.8 05/29/2020   T3FREE 5.8 (H) 02/27/2020   TPO antithyroid antibodies were elevated, while Graves' antibodies (TSI) were not: Component     Latest Ref Rng & Units 02/27/2020  Thyroperoxidase Ab SerPl-aCnc     0 - 34 IU/mL 157 (H)  Thyroid Stim Immunoglobulin     0.00 - 0.55 IU/Reese <0.10   ESR was not elevated: Component     Latest Ref Rng & Units 02/27/2020  Sed Rate     0 - 32 mm/hr 12    Thyroid ultrasound (02/26/2020) showed markedly heterogeneous gland  without nodules, consistent with thyroiditis  Thyroid Uptake and scan (03/07/2020): c/w subacute thyroiditis: CLINICAL DATA:  Postpartum thyroiditis.  FINDINGS: The thyroid gland shows minimal uptake and is poorly visualized. Moderate heterogeneous uptake.  4 hour I-123 uptake = 0.9%% (normal 5-20%)  24 hour I-123 uptake = 0.4%% (normal 10-30%)  IMPRESSION: Findings consistent with acute or subacute thyroiditis.  Pt denies: - feeling nodules in neck - hoarseness - dysphagia - choking - SOB with lying down  She denies: - fatigue - excessive sweating/heat intolerance - tremors - anxiety - palpitations - hyperdefecation - hair loss  Pt has a FH of thyroid ds.: thyroid nodules in mother. No FH of thyroid cancer. No h/o radiation tx to head or neck.  No seaweed or kelp, no recent contrast studies. No steroid use. No herbal supplements. No Biotin use.  Pt. also has a history of HAs.  She has an IUD.  She is a Quarry manager at [employer].  ROS: Constitutional: + see HPI Eyes: no blurry vision, no xerophthalmia ENT: no sore throat, + see HPI Cardiovascular: no CP/SOB/palpitations/leg swelling Respiratory: no cough/SOB Gastrointestinal: no N/V/D/C Musculoskeletal: no muscle/joint aches Skin: no rashes Neurological: no tremors/numbness/tingling/dizziness  I reviewed pt's medications, allergies, PMH, social hx, family hx, and changes were documented in the history of present illness. Otherwise, unchanged from my initial visit note.  Past Medical History:  Diagnosis  Date  . Headache(784.0)   . HPV vaccine counseling    Gardasil series completed ...   . HSV-1 infection   . Maternal anemia, with delivery    Past Surgical History:  Procedure Laterality Date  . MOUTH SURGERY    . TYMPANOPLASTY    . TYMPANOSTOMY TUBE PLACEMENT    . WISDOM TOOTH EXTRACTION     Social History   Socioeconomic History  . Marital status: Married    Spouse name: Not on file  .  Number of children: 1  . Years of education: Not on file  . Highest education level: Not on file  Occupational History  . Occupation: [employer]   Tobacco Use  . Smoking status: Never Smoker  . Smokeless tobacco: Never Used  Vaping Use  . Vaping Use: Never used  Substance and Sexual Activity  . Alcohol use: Not Currently    Alcohol/week: 0.0 standard drinks  . Drug use: No  . Sexual activity: Yes    Partners: Male    Birth control/protection: I.U.D.    Comment: NUVARING. 1st intercourse- 19, partners- 3  Other Topics Concern  . Not on file  Social History Narrative  . Not on file   Social Determinants of Health   Financial Resource Strain: Not on file  Food Insecurity: Not on file  Transportation Needs: Not on file  Physical Activity: Not on file  Stress: Not on file  Social Connections: Not on file  Intimate Partner Violence: Not on file   Current Outpatient Medications on File Prior to Visit  Medication Sig Dispense Refill  . azithromycin (ZITHROMAX) 250 MG tablet TAKE 2 TABLETS BY MOUTH ON DAY 1 THEN TAKE 1 TABLET DAILY FOR THE NEXT 4 DAYS 6 tablet 0  . benzonatate (TESSALON) 100 MG capsule TAKE 1 CAPSULE BY MOUTH 2 TO 3 TIMES PER DAY AS NEEDED FOR COUGH 30 capsule 0  . IUD'S IU by Intrauterine route.     No current facility-administered medications on file prior to visit.   Allergies  Allergen Reactions  . Celexa [Citalopram] Swelling    Swelling on face  . Ciprodex [Ciprofloxacin-Dexamethasone] Swelling    Swelling on face   Family History  Problem Relation Age of Onset  . Hypertension Mother   . Diabetes Mother   . Heart disease Maternal Grandmother   . Diabetes Maternal Grandfather   . Hypertension Maternal Grandfather   . Heart disease Maternal Grandfather   . Prostate cancer Maternal Grandfather   . Crohn's disease Sister   . Diabetes Father   . Colon cancer Neg Hx    PE: BP 130/82 (BP Location: Left Arm, Patient Position: Sitting,  Cuff Size: Large)   Pulse 81   Ht _0  (1.702 m)   Wt 222 lb 3.2 oz (100.8 kg)   SpO2 98%   BMI 34.80 kg/m  Wt Readings from Last 3 Encounters:  06/07/20 222 lb 3.2 oz (100.8 kg)  03/07/20 220 lb 3.2 oz (99.9 kg)  02/20/20 217 lb 6.4 oz (98.6 kg)   Constitutional: overweight, in NAD Eyes: PERRLA, EOMI, no exophthalmos, no lid lag, no stare ENT: moist mucous membranes, no thyromegaly, no thyroid bruit, no cervical lymphadenopathy Cardiovascular: RRR, No MRG Respiratory: CTA B Gastrointestinal: abdomen soft, NT, ND, BS+ Musculoskeletal: no deformities, strength intact in all 4 Skin: moist, warm, no rashes Neurological: no tremor with outstretched hands, DTR normal in all 4  ASSESSMENT: 1. Postpartum thyroiditis  - resolved  PLAN:  1.  Patient with postpartum thyrotoxicosis diagnosed last summer after she gave birth in 08/2019.  She did not have any thyrotoxic symptoms: Weight loss, heat intolerance, hyper defecation, palpitations anxiety.  She was referred to endocrinology and we checked her Graves' antibodies, which returned normal. We also checked a thyroid uptake and scan which was consistent with thyroiditis.  Since she was not symptomatic, I did not suggest any intervention at that time.   -We repeated her TFTs at the end of last month (approximately 10 days ago) and the TSH was now elevated, at 8.33, with normal free thyroid hormone, consistent with subclinical hypothyroidism.   -I discussed with the patient that this pattern is seen sometimes with resolution of thyroiditis and is followed by normalization of her TFTs in many patients, but occasionally with development of permanent hypothyroidism.   -Therefore, for now, I would not suggest any intervention, but would like her to return for repeat labs in another 3 to 4 weeks to check for trends. -We did discuss that if thyroid tests continue to worsen, she may need to start levothyroxine, especially since she feels that she has  gained some weight and this can be a complication of hypothyroidism. -We discussed about how to take this correctly in case we need to start: every day, with water, at least 30 minutes before breakfast, separated by at least 4 hours from: - acid reflux medications - calcium - iron - multivitamins -Will not check labs today but ordered the labs to be done in approximately 1 month -RTC in 4 months if the tests are abnormal.  If they are normal, we will check labs around that time, but move the office visit later.  Orders Placed This Encounter  Procedures  . TSH  . T4, free  . T3, free    Philemon Kingdom, MD PhD [facility]

## 2020-06-07 NOTE — Patient Instructions (Addendum)
Please come back for labs in ~ 3 weeks.  If we need to start levothyroxine, take the thyroid hormone every day, with water, at least 30 minutes before breakfast, separated by at least 4 hours from: - acid reflux medications - calcium - iron - multivitamins  Please return in 4 months.

## 2020-06-28 ENCOUNTER — Other Ambulatory Visit (INDEPENDENT_AMBULATORY_CARE_PROVIDER_SITE_OTHER): Payer: No Typology Code available for payment source

## 2020-06-28 ENCOUNTER — Other Ambulatory Visit: Payer: Self-pay

## 2020-06-28 DIAGNOSIS — O905 Postpartum thyroiditis: Secondary | ICD-10-CM

## 2020-06-28 LAB — T4, FREE: Free T4: 0.81 ng/dL (ref 0.60–1.60)

## 2020-06-28 LAB — T3, FREE: T3, Free: 3.2 pg/mL (ref 2.3–4.2)

## 2020-06-28 LAB — TSH: TSH: 5.54 u[IU]/mL — ABNORMAL HIGH (ref 0.35–4.50)

## 2020-08-31 ENCOUNTER — Telehealth: Payer: No Typology Code available for payment source | Admitting: Physician Assistant

## 2020-08-31 DIAGNOSIS — B373 Candidiasis of vulva and vagina: Secondary | ICD-10-CM | POA: Diagnosis not present

## 2020-08-31 DIAGNOSIS — N941 Unspecified dyspareunia: Secondary | ICD-10-CM

## 2020-08-31 DIAGNOSIS — B3731 Acute candidiasis of vulva and vagina: Secondary | ICD-10-CM

## 2020-09-01 ENCOUNTER — Encounter: Payer: Self-pay | Admitting: Physician Assistant

## 2020-09-01 MED ORDER — FLUCONAZOLE 150 MG PO TABS: 150.0000 mg | ORAL_TABLET | Freq: Once | ORAL | 0 refills | Status: AC

## 2020-09-01 NOTE — Progress Notes (Signed)
E-Visit for Vaginal Symptoms  We are sorry that you are not feeling well. Here is how we plan to help! Based on what you shared with me it looks like you: May have a yeast vaginosis  Vaginosis is an inflammation of the vagina that can result in discharge, itching and pain. The cause is usually a change in the normal balance of vaginal bacteria or an infection. Vaginosis can also result from reduced estrogen levels after menopause.  The most common causes of vaginosis are:   Bacterial vaginosis which results from an overgrowth of one on several organisms that are normally present in your vagina.   Yeast infections which are caused by a naturally occurring fungus called candida.   Vaginal atrophy (atrophic vaginosis) which results from the thinning of the vagina from reduced estrogen levels after menopause.   Trichomoniasis which is caused by a parasite and is commonly transmitted by sexual intercourse.  Factors that increase your risk of developing vaginosis include: Medications, such as antibiotics and steroids Uncontrolled diabetes Use of hygiene products such as bubble bath, vaginal spray or vaginal deodorant Douching Wearing damp or tight-fitting clothing Using an intrauterine device (IUD) for birth control Hormonal changes, such as those associated with pregnancy, birth control pills or menopause Sexual activity Having a sexually transmitted infection  Your treatment plan is A single Diflucan (fluconazole) 150mg  tablet once.  I have electronically sent this prescription into the pharmacy that you have chosen.   Ms. ,  pain during intercourse may be from the sensitive skin due to yeast infection, if you continue to have pain during sex after resolution of illness, then please follow up with your doctor. The prescribed pill should cover the infection inside and on the outside and no additional cream is necessary to cure the infection.   Be sure to take all of the medication  as directed. Stop taking any medication if you develop a rash, tongue swelling or shortness of breath. Mothers who are breast feeding should consider pumping and discarding their breast milk while on these antibiotics. However, there is no consensus that infant exposure at these doses would be harmful.  Remember that medication creams can weaken latex condoms. Mindi Curling   HOME CARE:  Good hygiene may prevent some types of vaginosis from recurring and may relieve some symptoms:  Avoid baths, hot tubs and whirlpool spas. Rinse soap from your outer genital area after a shower, and dry the area well to prevent irritation. Don't use scented or harsh soaps, such as those with deodorant or antibacterial action. Avoid irritants. These include scented tampons and pads. Wipe from front to back after using the toilet. Doing so avoids spreading fecal bacteria to your vagina.  Other things that may help prevent vaginosis include:  Don't douche. Your vagina doesn't require cleansing other than normal bathing. Repetitive douching disrupts the normal organisms that reside in the vagina and can actually increase your risk of vaginal infection. Douching won't clear up a vaginal infection. Use a latex condom. Both female and female latex condoms may help you avoid infections spread by sexual contact. Wear cotton underwear. Also wear pantyhose with a cotton crotch. If you feel comfortable without it, skip wearing underwear to bed. Yeast thrives in [place] Your symptoms should improve in the next day or two.  GET HELP RIGHT AWAY IF:  You have pain in your lower abdomen ( pelvic area or over your ovaries) You develop nausea or vomiting You develop a fever Your discharge changes or  worsens You have persistent pain with intercourse You develop shortness of breath, a rapid pulse, or you faint.  These symptoms could be signs of problems or infections that need to be evaluated by a medical provider now.  MAKE  SURE YOU   Understand these instructions. Will watch your condition. Will get help right away if you are not doing well or get worse.  Thank you for choosing an e-visit.  Your e-visit answers were reviewed by a board certified advanced clinical practitioner to complete your personal care plan. Depending upon the condition, your plan could have included both over the counter or prescription medications.  Please review your pharmacy choice. Make sure the pharmacy is open so you can pick up prescription now. If there is a problem, you may contact your provider through [employer] and have the prescription routed to another pharmacy.  Your safety is important to [place]. If you have drug allergies check your prescription carefully.   For the next 24 hours you can use MyChart to ask questions about today's visit, request a non-urgent call back, or ask for a work or school excuse. You will get an email in the next two days asking about your experience. I hope that your e-visit has been valuable and will speed your recovery. I spent 5-10 minutes on review and completion of this note- Illa Level [facility]

## 2020-09-05 ENCOUNTER — Telehealth: Payer: Self-pay | Admitting: Internal Medicine

## 2020-09-05 ENCOUNTER — Other Ambulatory Visit (HOSPITAL_COMMUNITY): Payer: Self-pay

## 2020-09-05 ENCOUNTER — Other Ambulatory Visit: Payer: Self-pay | Admitting: Medical

## 2020-09-05 MED ORDER — FLUCONAZOLE 150 MG PO TABS
150.0000 mg | ORAL_TABLET | Freq: Once | ORAL | 0 refills | Status: AC
  Filled 2020-09-05: qty 2, 2d supply, fill #0

## 2020-09-05 NOTE — Telephone Encounter (Signed)
Pt.notified

## 2020-09-05 NOTE — Telephone Encounter (Signed)
Pt called and states that she did an e-visit for vaginal issue the other day and and was given diflucan. It got better by taking it and she started her period today and thinks it through off the [facility] level and wants another dose of diflucan sent in.

## 2020-09-05 NOTE — Telephone Encounter (Signed)
Patient is having white discharge, redness, itching and some swelling. She is on her cycle now

## 2020-09-13 ENCOUNTER — Telehealth: Payer: No Typology Code available for payment source | Admitting: Physician Assistant

## 2020-09-13 ENCOUNTER — Other Ambulatory Visit (HOSPITAL_COMMUNITY): Payer: Self-pay

## 2020-09-13 DIAGNOSIS — J40 Bronchitis, not specified as acute or chronic: Secondary | ICD-10-CM | POA: Diagnosis not present

## 2020-09-13 MED ORDER — BENZONATATE 100 MG PO CAPS
100.0000 mg | ORAL_CAPSULE | Freq: Three times a day (TID) | ORAL | 0 refills | Status: DC | PRN
  Filled 2020-09-13: qty 30, 10d supply, fill #0

## 2020-09-13 MED ORDER — ALBUTEROL SULFATE HFA 108 (90 BASE) MCG/ACT IN AERS
2.0000 | INHALATION_SPRAY | Freq: Four times a day (QID) | RESPIRATORY_TRACT | 0 refills | Status: DC | PRN
  Filled 2020-09-13: qty 8.5, 25d supply, fill #0

## 2020-09-13 MED ORDER — PREDNISONE 10 MG (21) PO TBPK
ORAL_TABLET | ORAL | 0 refills | Status: DC
  Filled 2020-09-13: qty 21, 6d supply, fill #0

## 2020-09-13 NOTE — Progress Notes (Signed)
We are sorry that you are not feeling well.  Here is how we plan to help!  Based on your presentation I believe you most likely have A cough due to a virus.  This is called viral bronchitis and is best treated by rest, plenty of fluids and control of the cough.  You may use Ibuprofen or Tylenol as directed to help your symptoms.     In addition you may use A prescription cough medication called Tessalon Perles 100mg. You may take 1-2 capsules every 8 hours as needed for your cough.  Prednisone 10 mg daily for 6 days (see taper instructions below)  Directions for 6 day taper: Day 1: 2 tablets before breakfast, 1 after both lunch & dinner and 2 at bedtime Day 2: 1 tab before breakfast, 1 after both lunch & dinner and 2 at bedtime Day 3: 1 tab at each meal & 1 at bedtime Day 4: 1 tab at breakfast, 1 at lunch, 1 at bedtime Day 5: 1 tab at breakfast & 1 tab at bedtime Day 6: 1 tab at breakfast  From your responses in the eVisit questionnaire you describe inflammation in the upper respiratory tract which is causing a significant cough.  This is commonly called Bronchitis and has four common causes:   Allergies Viral Infections Acid Reflux Bacterial Infection Allergies, viruses and acid reflux are treated by controlling symptoms or eliminating the cause. An example might be a cough caused by taking certain blood pressure medications. You stop the cough by changing the medication. Another example might be a cough caused by acid reflux. Controlling the reflux helps control the cough.  USE OF BRONCHODILATOR ("RESCUE") INHALERS: There is a risk from using your bronchodilator too frequently.  The risk is that over-reliance on a medication which only relaxes the muscles surrounding the breathing tubes can reduce the effectiveness of medications prescribed to reduce swelling and congestion of the tubes themselves.  Although you feel brief relief from the bronchodilator inhaler, your asthma may actually be  worsening with the tubes becoming more swollen and filled with mucus.  This can delay other crucial treatments, such as oral steroid medications. If you need to use a bronchodilator inhaler daily, several times per day, you should discuss this with your provider.  There are probably better treatments that could be used to keep your asthma under control.     HOME CARE Only take medications as instructed by your medical team. Complete the entire course of an antibiotic. Drink plenty of fluids and get plenty of rest. Avoid close contacts especially the very young and the elderly Cover your mouth if you cough or cough into your sleeve. Always remember to wash your hands A steam or ultrasonic humidifier can help congestion.   GET HELP RIGHT AWAY IF: You develop worsening fever. You become short of breath You cough up blood. Your symptoms persist after you have completed your treatment plan MAKE SURE YOU  Understand these instructions. Will watch your condition. Will get help right away if you are not doing well or get worse.    Thank you for choosing an e-visit.  Your e-visit answers were reviewed by a board certified advanced clinical practitioner to complete your personal care plan. Depending upon the condition, your plan could have included both over the counter or prescription medications.  Please review your pharmacy choice. Make sure the pharmacy is open so you can pick up prescription now. If there is a problem, you may contact your provider   through MyChart messaging and have the prescription routed to another pharmacy.  Your safety is important to us. If you have drug allergies check your prescription carefully.   For the next 24 hours you can use MyChart to ask questions about today's visit, request a non-urgent call back, or ask for a work or school excuse. You will get an email in the next two days asking about your experience. I hope that your e-visit has been valuable and will  speed your recovery.  I provided 6 minutes of non face-to-face time during this encounter for chart review and documentation.   

## 2020-10-10 ENCOUNTER — Other Ambulatory Visit (HOSPITAL_COMMUNITY): Payer: Self-pay

## 2020-10-10 ENCOUNTER — Encounter: Payer: Self-pay | Admitting: Medical

## 2020-10-10 ENCOUNTER — Ambulatory Visit (INDEPENDENT_AMBULATORY_CARE_PROVIDER_SITE_OTHER): Payer: No Typology Code available for payment source | Admitting: Medical

## 2020-10-10 ENCOUNTER — Other Ambulatory Visit: Payer: Self-pay

## 2020-10-10 ENCOUNTER — Telehealth: Payer: Self-pay | Admitting: Internal Medicine

## 2020-10-10 VITALS — BP 120/80 | HR 85 | Temp 97.9°F | Resp 16 | Wt 223.2 lb

## 2020-10-10 DIAGNOSIS — R053 Chronic cough: Secondary | ICD-10-CM

## 2020-10-10 MED ORDER — OMEPRAZOLE 40 MG PO CPDR
40.0000 mg | DELAYED_RELEASE_CAPSULE | Freq: Every day | ORAL | 0 refills | Status: DC
  Filled 2020-10-10: qty 30, 30d supply, fill #0

## 2020-10-10 MED ORDER — FLUTICASONE-SALMETEROL 250-50 MCG/ACT IN AEPB
2.0000 | INHALATION_SPRAY | Freq: Two times a day (BID) | RESPIRATORY_TRACT | 0 refills | Status: DC
  Filled 2020-10-10: qty 12, 30d supply, fill #0
  Filled 2020-10-10: qty 60, 30d supply, fill #0

## 2020-10-10 MED ORDER — FLUTICASONE PROPIONATE HFA 110 MCG/ACT IN AERO
2.0000 | INHALATION_SPRAY | Freq: Two times a day (BID) | RESPIRATORY_TRACT | 0 refills | Status: DC
  Filled 2020-10-10: qty 1, fill #0

## 2020-10-10 NOTE — Telephone Encounter (Signed)
Per kendall at [facility] long outpatient pharmacy. She said pt was given a verbal for advair 250-50 but it needed to be 1 puff twice a day and not 2 puff twice a day. She called back and I gave verbal ok to switch it as it is recommended for higher dose.

## 2020-10-10 NOTE — Progress Notes (Signed)
Subjective:  Laurie Reese is a 35 y.o. female who presents for Chief Complaint  Patient presents with   ongoing cough    Ongoing cough for months. Deep cough in chest. Barky cough. Did e-visit 7/15 and was given prednisone, got better but a week later came back. 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      She is here for ongoing cough x 1 month.   Had e-visit in July for couhg but was not sick.  Was given prednisone, inhaler, and tessalon at that time.  Did good for a week but then symptoms returned.   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    Continues to cough all day and night.    No itching, no itchy eyes or nose, no sneezing, no sore throat, no NVD, no fever.   Every now and then feels SOB.  Has used inhaler some on and off.  Her doctor she works with last week listened to lungs and lung exam was ok then.     She is a nonsmoker, not around smoke.  No leg swelling.   No chest pain.  No belly pain, no heartburn, no belching.     Has 2 dogs but no other pets.  No other aggravating or relieving factors.    No other c/o.  Past Medical History:  Diagnosis Date   Headache(784.0)    HPV vaccine counseling    Gardasil series completed ...    HSV-1 infection    Maternal anemia, with delivery    Postpartum thyroiditis 06/07/2020   Current Outpatient Medications on File Prior to Visit  Medication Sig Dispense Refill   albuterol (VENTOLIN HFA) 108 (90 Base) MCG/ACT inhaler Inhale 2 puffs into the lungs every 6 (six) hours as needed for wheezing or shortness of breath. 8.5 g 0   levonorgestrel (MIRENA) 20 MCG/24HR IUD 1 each by Intrauterine route once.     No current facility-administered medications on file prior to visit.   Family History  Problem Relation Age of Onset   Hypertension Mother    Diabetes Mother    Heart disease Maternal Grandmother    Diabetes  Maternal Grandfather    Hypertension Maternal Grandfather    Heart disease Maternal Grandfather    Prostate cancer Maternal Grandfather    Crohn's disease Sister    Diabetes Father    Colon cancer Neg Hx     The following portions of the patient's history were reviewed and updated as appropriate: allergies, current medications, past family history, past medical history, past social history, past surgical history and problem list.  ROS Otherwise as in subjective above    Objective: BP 120/80   Pulse 85   Temp 97.9 F (36.6 C)   Resp 16   Wt 223 lb 3.2 oz (101.2 kg)   SpO2 99%   BMI 34.96 kg/m   General appearance: alert, no distress, well developed, well nourished HEENT: normocephalic, sclerae anicteric, conjunctiva pink and moist, TMs pearly, nares patent, no discharge or erythema, pharynx normal Oral cavity: MMM, no lesions Neck: supple, no lymphadenopathy, no thyromegaly, no masses Heart: RRR, normal S1, S2, no murmurs Lungs: CTA bilaterally, no wheezes, rhonchi, or rales Abdomen: +bs, soft, non tender, non distended, no masses, no hepatomegaly, no splenomegaly Pulses: 2+ radial pulses, 2+ pedal pulses, normal cap refill Ext: no  edema   Assessment: Encounter Diagnosis  Name Primary?   Chronic cough Yes     Plan: We discussed possible causes of chronic cough.  We discussed common things like acid reflux and allergies and asthma, we discussed other scary things like lung cancer and tumors and other issues.  We will send for x-ray to rule out anything concerning.  Baseline PFT today.  Hydrate well, avoid GERD trigger foods  Begin trial of allergy medication such as Zyrtec nightly and acid reflux medicine below daily for 2 to [redacted] weeks along with a cough suppressant OTC.  Begin trial of Advair as well.  Discussed proper use  Laurie Reese was seen today for ongoing cough.  Diagnoses and all orders for this visit:  Chronic cough -     DG Chest 2 View; Future -      Spirometry with graph  Other orders -     omeprazole (PRILOSEC) 40 MG capsule; Take 1 capsule (40 mg total) by mouth daily. -     Discontinue: fluticasone (FLOVENT HFA) 110 MCG/ACT inhaler; Inhale 2 puffs into the lungs in the morning and at bedtime. -     fluticasone-salmeterol (ADVAIR HFA) 115-21 MCG/ACT inhaler; Inhale 2 puffs into the lungs 2 (two) times daily.   Follow up: pending chest xray

## 2020-10-11 ENCOUNTER — Ambulatory Visit
Admission: RE | Admit: 2020-10-11 | Discharge: 2020-10-11 | Disposition: A | Discharge status: Home / Self Care | Payer: No Typology Code available for payment source | Source: Ambulatory Visit | Attending: Medical | Admitting: Medical

## 2020-10-11 DIAGNOSIS — R053 Chronic cough: Secondary | ICD-10-CM

## 2020-10-11 NOTE — Progress Notes (Signed)
Lvm for pt advising of x ray. KH

## 2020-10-17 ENCOUNTER — Telehealth: Payer: No Typology Code available for payment source | Admitting: Physician Assistant

## 2020-10-17 ENCOUNTER — Other Ambulatory Visit (HOSPITAL_COMMUNITY): Payer: Self-pay

## 2020-10-17 DIAGNOSIS — H66001 Acute suppurative otitis media without spontaneous rupture of ear drum, right ear: Secondary | ICD-10-CM | POA: Diagnosis not present

## 2020-10-17 MED ORDER — AMOXICILLIN-POT CLAVULANATE 875-125 MG PO TABS
1.0000 | ORAL_TABLET | Freq: Two times a day (BID) | ORAL | 0 refills | Status: DC
  Filled 2020-10-17: qty 14, 7d supply, fill #0

## 2020-10-17 NOTE — Progress Notes (Signed)
E Visit for Swimmer's Ear  We are sorry that you are not feeling well. Here is how we plan to help!  Based on what you are telling me it seems most likely that you have a middle ear infection giving red, bulging ear drum with pus behind it, instead of swimmers ear (external ear infection). I am sending in a prescription for Augmentin to take twice daily for 7 days.    Your symptoms should improve over the next 3 days and should resolve in about 7 days.   GET HELP RIGHT AWAY IF:  Fever is over 102.2 degrees. You develop progressive ear pain or hearing loss. Ear symptoms persist longer than 3 days after treatment.  MAKE SURE YOU:  Understand these instructions. Will watch your condition. Will get help right away if you are not doing well or get worse.  TO PREVENT SWIMMER'S EAR: Use a bathing cap or custom fitted swim molds to keep your ears dry. Towel off after swimming to dry your ears. Tilt your head or pull your earlobes to allow the water to escape your ear canal. If there is still water in your ears, consider using a hairdryer on the lowest setting.   Thank you for choosing an e-visit.  Your e-visit answers were reviewed by a board certified advanced clinical practitioner to complete your personal care plan. Depending upon the condition, your plan could have included both over the counter or prescription medications.  Please review your pharmacy choice. Make sure the pharmacy is open so you can pick up prescription now. If there is a problem, you may contact your provider through [employer] and have the prescription routed to another pharmacy.  Your safety is important to [place]. If you have drug allergies check your prescription carefully.   For the next 24 hours you can use MyChart to ask questions about today's visit, request a non-urgent call back, or ask for a work or school excuse. You will get an email in the next two days asking about your experience. I hope that  your e-visit has been valuable and will speed your recovery.

## 2020-10-17 NOTE — Progress Notes (Signed)
I have spent 5 minutes in review of e-visit questionnaire, review and updating patient chart, medical decision making and response to patient.   Dajon Rowe Cody Tanisia Yokley, PA-C    

## 2020-10-18 ENCOUNTER — Ambulatory Visit: Payer: No Typology Code available for payment source | Admitting: Internal Medicine

## 2020-11-15 ENCOUNTER — Telehealth: Payer: Self-pay | Admitting: Internal Medicine

## 2020-11-15 NOTE — Telephone Encounter (Signed)
Pt would like to switch to you since vickie is leaving. This is my sister. Is this ok

## 2020-11-18 NOTE — Telephone Encounter (Signed)
Pt was notified.  

## 2020-11-19 ENCOUNTER — Encounter: Payer: Self-pay | Admitting: Internal Medicine

## 2020-12-20 ENCOUNTER — Telehealth: Payer: No Typology Code available for payment source | Admitting: Family Medicine

## 2020-12-20 ENCOUNTER — Other Ambulatory Visit (HOSPITAL_COMMUNITY): Payer: Self-pay

## 2020-12-20 DIAGNOSIS — H5789 Other specified disorders of eye and adnexa: Secondary | ICD-10-CM | POA: Diagnosis not present

## 2020-12-20 MED ORDER — POLYMYXIN B-TRIMETHOPRIM 10000-0.1 UNIT/ML-% OP SOLN
1.0000 [drp] | OPHTHALMIC | 0 refills | Status: AC
  Filled 2020-12-20: qty 10, 13d supply, fill #0

## 2020-12-20 NOTE — Progress Notes (Signed)

## 2020-12-23 ENCOUNTER — Encounter: Payer: Self-pay | Admitting: Internal Medicine

## 2020-12-23 ENCOUNTER — Other Ambulatory Visit: Payer: Self-pay

## 2020-12-23 ENCOUNTER — Ambulatory Visit (INDEPENDENT_AMBULATORY_CARE_PROVIDER_SITE_OTHER): Payer: No Typology Code available for payment source | Admitting: Internal Medicine

## 2020-12-23 VITALS — BP 122/72 | HR 87 | Ht 67.0 in | Wt 228.2 lb

## 2020-12-23 DIAGNOSIS — E661 Drug-induced obesity: Secondary | ICD-10-CM

## 2020-12-23 DIAGNOSIS — O905 Postpartum thyroiditis: Secondary | ICD-10-CM

## 2020-12-23 DIAGNOSIS — Z6835 Body mass index (BMI) 35.0-35.9, adult: Secondary | ICD-10-CM

## 2020-12-23 LAB — T4, FREE: Free T4: 0.76 ng/dL (ref 0.60–1.60)

## 2020-12-23 LAB — T3, FREE: T3, Free: 3 pg/mL (ref 2.3–4.2)

## 2020-12-23 LAB — TSH: TSH: 1.63 u[IU]/mL (ref 0.35–5.50)

## 2020-12-23 NOTE — Patient Instructions (Addendum)
Please stop at the lab.  If we need to start levothyroxine, take the thyroid hormone every day, with water, at least 30 minutes before breakfast, separated by at least 4 hours from: - acid reflux medications - calcium - iron - multivitamins  Please return in 6 months.

## 2020-12-23 NOTE — Progress Notes (Signed)
Patient ID: Laurie Reese, female   DOB: 10/14/1985, 35 y.o.   MRN: [id number]  This visit occurred during the SARS-CoV-2 public health emergency.  Safety protocols were in place, including screening questions prior to the visit, additional usage of staff PPE, and extensive cleaning of exam room while observing appropriate contact time as indicated for disinfecting solutions.   HPI  Laurie Reese is a 35 y.o.-year-old female, initially referred by her PCP, Henson, Vickie L, PA-C, returning for f/u for postpartum thyroiditis.  Last OV 6 months ago.  Interim history: She denies tremors, palpitations, heat intolerance, unintentional weight loss, increased anxiety. She has some fatigue, and also weigh gain, but no constipation, dry skin. She is not taking any steroids or biotin.  Reviewed and addended hx: Patient gave birth on 09/15/2019 to healthy baby.  She was found to be thyrotoxic on labs checked postpartum.  She did not have any thyrotoxic symptoms at that time and remained asymptomatic afterwards.  She had another set of thyroid tests checked in 04/2020 and a TSH was elevated, while the free thyroid hormones were normal.  Repeat tests 1 month later showed an improved TSH with still normal free thyroid hormones.  She did not return for labs afterwards.  I reviewed pt's thyroid tests: Lab Results  Component Value Date   TSH 5.54 (H) 06/28/2020   TSH 8.33 (H) 05/29/2020   TSH <0.005 (L) 02/27/2020   TSH <0.005 (L) 02/20/2020   TSH 1.42 06/25/2016   TSH 1.15 06/24/2015   TSH 1.150 06/21/2014   TSH 1.229 06/15/2013   TSH 1.056 06/09/2012   TSH 0.961 05/13/2011   FREET4 0.81 06/28/2020   FREET4 0.81 05/29/2020   FREET4 2.24 (H) 02/27/2020   FREET4 2.75 (H) 02/20/2020   T3FREE 3.2 06/28/2020   T3FREE 2.8 05/29/2020   T3FREE 5.8 (H) 02/27/2020   TPO antithyroid antibodies were elevated, while Graves' antibodies (TSI) were not: Component     Latest Ref Rng & Units 02/27/2020   Thyroperoxidase Ab SerPl-aCnc     0 - 34 IU/mL 157 (H)  Thyroid Stim Immunoglobulin     0.00 - 0.55 IU/L <0.10   ESR was not elevated: Component     Latest Ref Rng & Units 02/27/2020  Sed Rate     0 - 32 mm/hr 12    Thyroid ultrasound (02/26/2020) showed markedly heterogeneous gland without nodules, consistent with thyroiditis  Thyroid Uptake and scan (03/07/2020): c/w subacute thyroiditis: CLINICAL DATA:  Postpartum thyroiditis.   FINDINGS: The thyroid gland shows minimal uptake and is poorly visualized. Moderate heterogeneous uptake.   4 hour I-123 uptake = 0.9%% (normal 5-20%)   24 hour I-123 uptake = 0.4%% (normal 10-30%)   IMPRESSION: Findings consistent with acute or subacute thyroiditis.  Pt denies: - feeling nodules in neck - hoarseness - dysphagia - choking - SOB with lying down  Pt has a FH of thyroid ds.: thyroid nodules in mother. No FH of thyroid cancer. No h/o radiation tx to head or neck.  No steroid use. No herbal supplements. No Biotin use.  Pt. also has a history of HAs.  She has an IUD.  She is a Quarry manager at [employer].  ROS: + See HPI  I reviewed pt's medications, allergies, PMH, social hx, family hx, and changes were documented in the history of present illness. Otherwise, unchanged from my initial visit note.  Past Medical History:  Diagnosis Date   Headache(784.0)    HPV vaccine counseling  Gardasil series completed ...    HSV-1 infection    Maternal anemia, with delivery    Postpartum thyroiditis 06/07/2020   Past Surgical History:  Procedure Laterality Date   MOUTH SURGERY     TYMPANOPLASTY     TYMPANOSTOMY TUBE PLACEMENT     WISDOM TOOTH EXTRACTION     Social History   Socioeconomic History   Marital status: Married    Spouse name: Not on file   Number of children: 1   Years of education: Not on file   Highest education level: Not on file  Occupational History   Occupation: [employer]   Tobacco  Use   Smoking status: Never   Smokeless tobacco: Never  Vaping Use   Vaping Use: Never used  Substance and Sexual Activity   Alcohol use: Not Currently    Alcohol/week: 0.0 standard drinks   Drug use: No   Sexual activity: Yes    Partners: Male    Birth control/protection: I.U.D.    Comment: NUVARING. 1st intercourse- 19, partners- 3  Other Topics Concern   Not on file  Social History Narrative   Not on file   Social Determinants of Health   Financial Resource Strain: Not on file  Food Insecurity: Not on file  Transportation Needs: Not on file  Physical Activity: Not on file  Stress: Not on file  Social Connections: Not on file  Intimate Partner Violence: Not on file   Current Outpatient Medications on File Prior to Visit  Medication Sig Dispense Refill   albuterol (VENTOLIN HFA) 108 (90 Base) MCG/ACT inhaler Inhale 2 puffs into the lungs every 6 (six) hours as needed for wheezing or shortness of breath. 8.5 g 0   amoxicillin-clavulanate (AUGMENTIN) 875-125 MG tablet Take 1 tablet by mouth 2 times daily. 14 tablet 0   fluticasone-salmeterol (ADVAIR DISKUS) 250-50 MCG/ACT AEPB Inhale 1 puffs into the lungs 2 (two) times daily. 60 each 0   levonorgestrel (MIRENA) 20 MCG/24HR IUD 1 each by Intrauterine route once.     omeprazole (PRILOSEC) 40 MG capsule Take 1 capsule (40 mg total) by mouth daily. 30 capsule 0   trimethoprim-polymyxin b (POLYTRIM) ophthalmic solution Place 1 drop into both eyes every 4 hours for 5 days. 10 mL 0   No current facility-administered medications on file prior to visit.   Allergies  Allergen Reactions   Celexa [Citalopram] Swelling    Swelling on face   Ciprodex [Ciprofloxacin-Dexamethasone] Swelling    Swelling on face   Family History  Problem Relation Age of Onset   Hypertension Mother    Diabetes Mother    Heart disease Maternal Grandmother    Diabetes Maternal Grandfather    Hypertension Maternal Grandfather    Heart disease Maternal  Grandfather    Prostate cancer Maternal Grandfather    Crohn's disease Sister    Diabetes Father    Colon cancer Neg Hx    PE: BP 122/72 (BP Location: Right Arm, Patient Position: Sitting, Cuff Size: Normal)   Pulse 87   Ht 5' 7"  (1.702 m)   Wt 228 lb 3.2 oz (103.5 kg)   SpO2 97%   BMI 35.74 kg/m  Wt Readings from Last 3 Encounters:  12/23/20 228 lb 3.2 oz (103.5 kg)  10/10/20 223 lb 3.2 oz (101.2 kg)  06/07/20 222 lb 3.2 oz (100.8 kg)   Constitutional: overweight, in NAD Eyes: PERRLA, EOMI, no exophthalmos, no stare ENT: moist mucous membranes, no thyromegaly, no thyroid bruit, no  cervical lymphadenopathy Cardiovascular: RRR, No MRG Respiratory: CTA B Gastrointestinal: abdomen soft, NT, ND, BS+ Musculoskeletal: no deformities, strength intact in all 4 Skin: moist, warm, no rashes Neurological: no tremor with outstretched hands, DTR normal in all 4  ASSESSMENT: 1. Postpartum thyroiditis  - resolved  2.  Obesity class II  PLAN:  1. Patient with history of postpartum thyrotoxicosis, diagnosed after she gave birth in 08/2019.  She did not have any thyrotoxic symptoms (weight loss, heat intolerance, hyper defecation, palpitations, anxiety).  She was referred to endocrinology and we checked her Graves' antibodies, which returned normal.  Also, a thyroid uptake and scan was not consistent with Graves' disease, but instead with thyroiditis.  No treatment was required at that time since she was asymptomatic, but we continued to follow her TFTs. -TSH increased to 8.33 before last visit while free thyroid hormones were normal.  We discussed at that time that this is not unusual with resolution of thyroiditis and discussed about repeating TFTs in a month.  TSH improved and was only slightly elevated at last check in 06/2020, at 5.54. -At this visit, she remains asymptomatic.  We will recheck her TFTs.   -We discussed about the possible need for levothyroxine in case TSH does not improve  and start increasing. -I did discuss with her about how to take levothyroxine correctly, If we need to start: every day, with water, at least 30 minutes before breakfast, separated by at least 4 hours from: - acid reflux medications - calcium - iron - multivitamins -I will have her come back in 6 months for visit if still labs are abnormal, but if they are normal, we can just repeat the labs in 6 months and follow-up with PCP afterwards if the tests remain normal  2.  Obesity class II -She gained approximately 6 pounds at last visit -She inquires about possible ways to lose weight.  We discussed about the importance of improving diet and I advised her to focus on the following changes for now: No sweet drinks (she drinks sweet tea) No snacking between meals Lighter dinners (with lower calorie density foods -mostly plant-based) Also, increase exercise by:  trying to get to 10,000 steps a day Including weight training to increase her metabolism Also, she is not sleeping well at her daughter still waking up at night-good sleep will also help with weight loss  Component     Latest Ref Rng & Units 12/23/2020  TSH     0.35 - 5.50 uIU/mL 1.63  T4,Free(Direct)     0.60 - 1.60 ng/dL 0.76  Triiodothyronine,Free,Serum     2.3 - 4.2 pg/mL 3.0  Normal TFTs.  We will repeat the labs in 6 months.  Philemon Kingdom, MD PhD [facility] Endocrinology

## 2021-01-15 IMAGING — US US THYROID
1 series · 14 of 25 positions shown · non-contrast
Comparison: None.

CLINICAL DATA: Hyperthyroid. Five months postpartum, now with
hyperthyroidism.

EXAM:
THYROID ULTRASOUND
TECHNIQUE: Ultrasound examination of the thyroid gland and adjacent soft
tissues was performed.

[Series 1: us thyroid · 0.05mm/px · 14 of 42 slices shown]
[im 1/42]
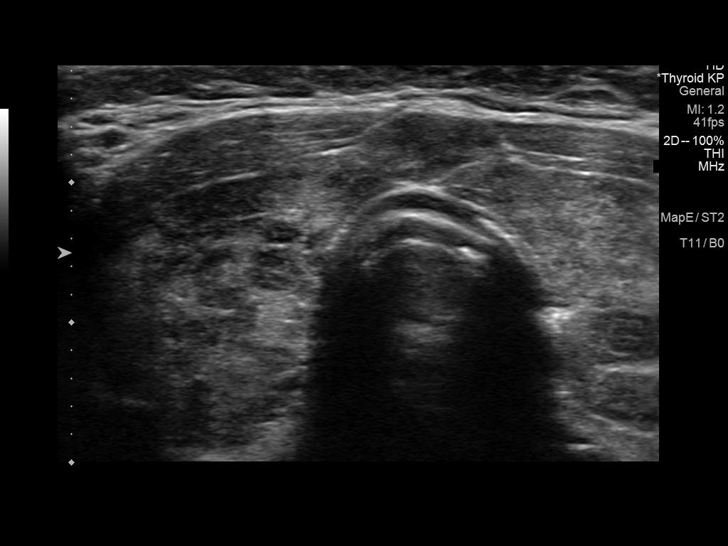
[im 4/42]
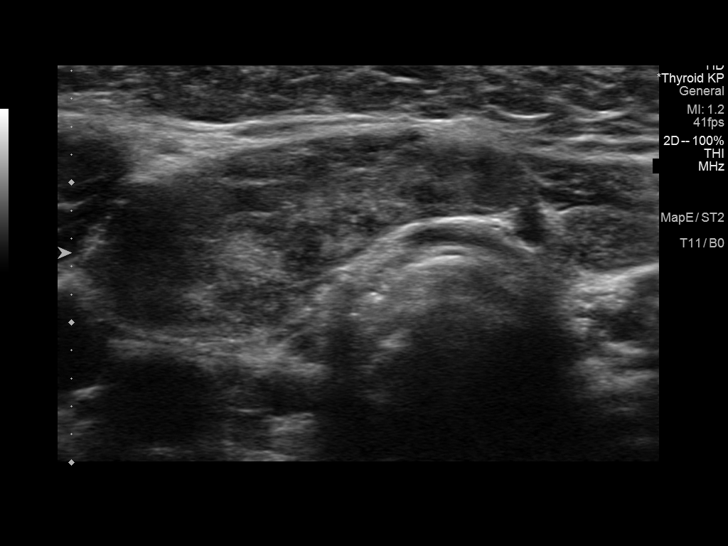
[im 7/42]
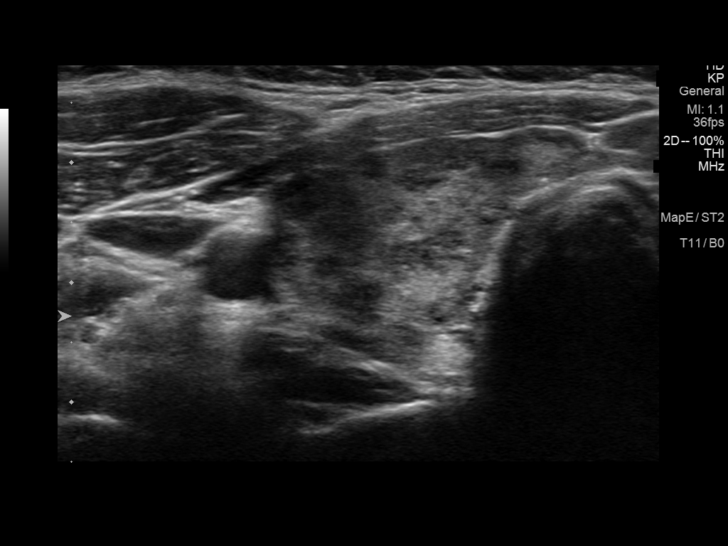
[im 11/42]
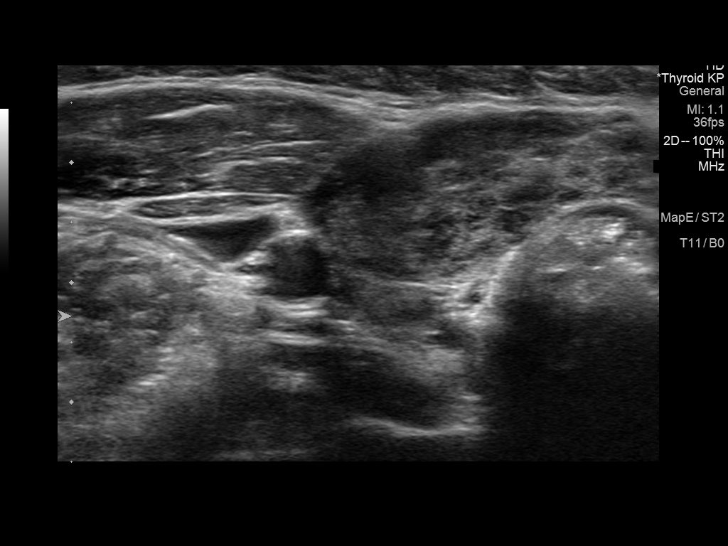
[im 14/42]
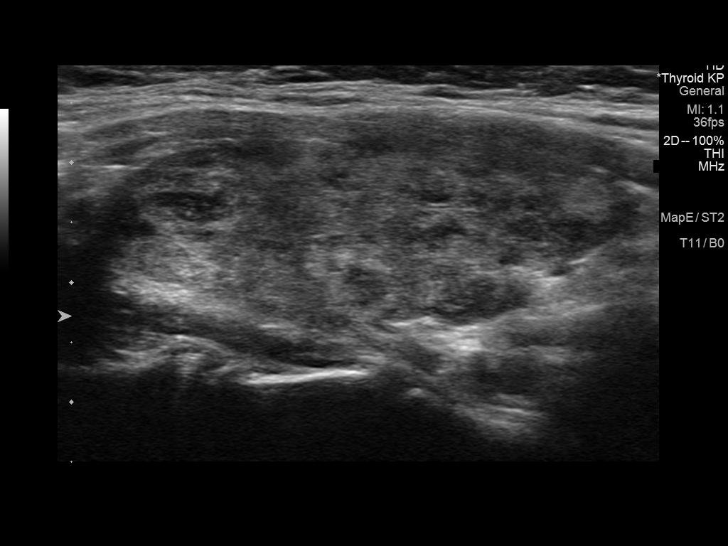
[im 16/42]
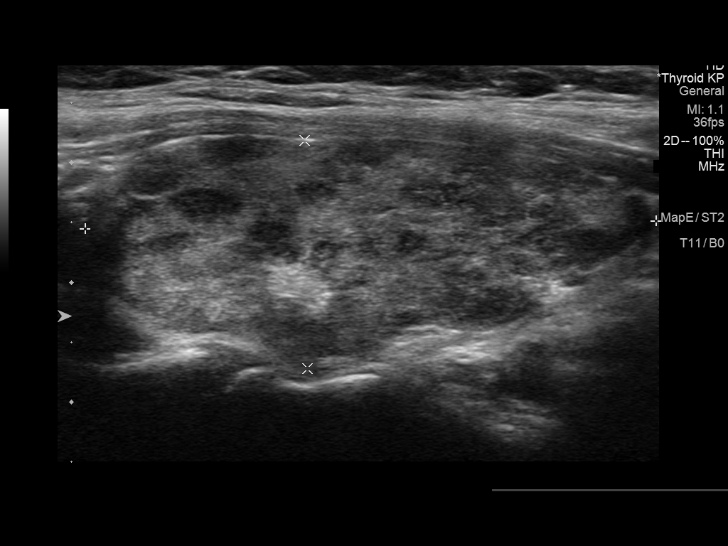
[im 19/42]
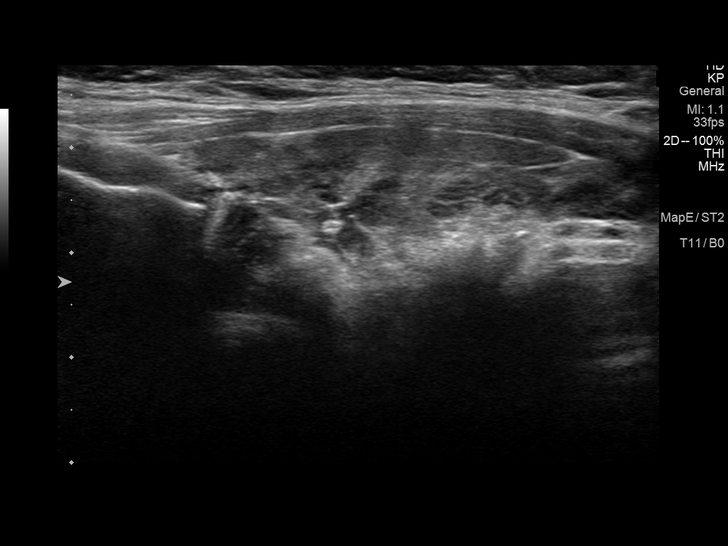
[im 23/42]
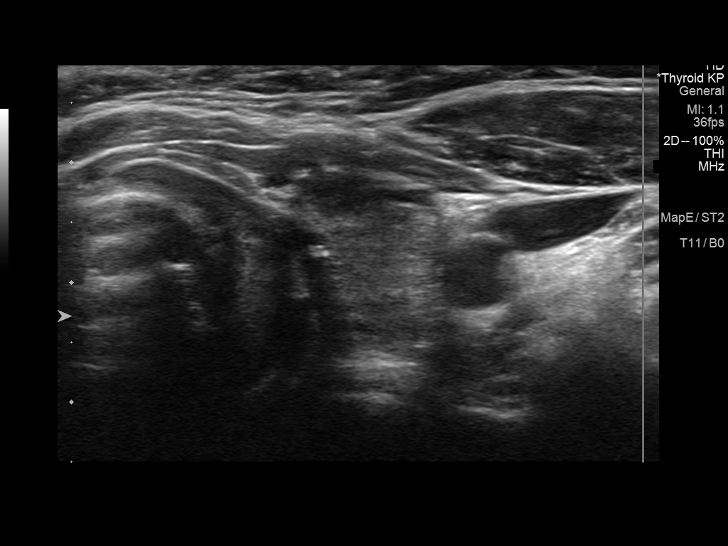
[im 26/42]
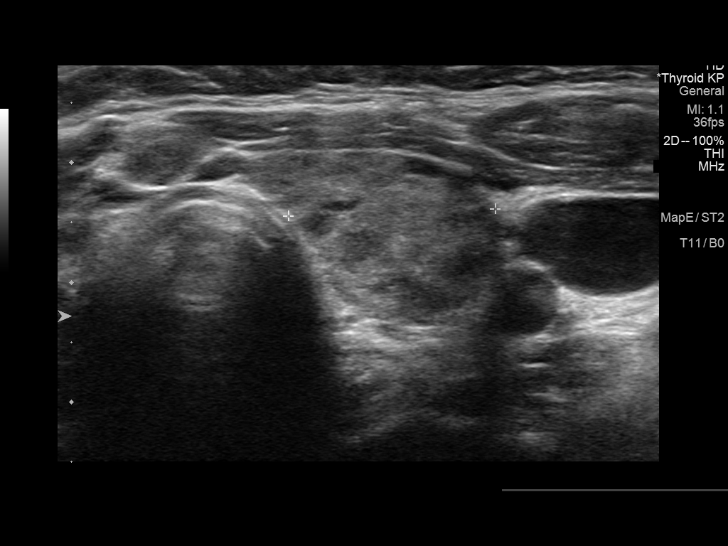
[im 28/42]
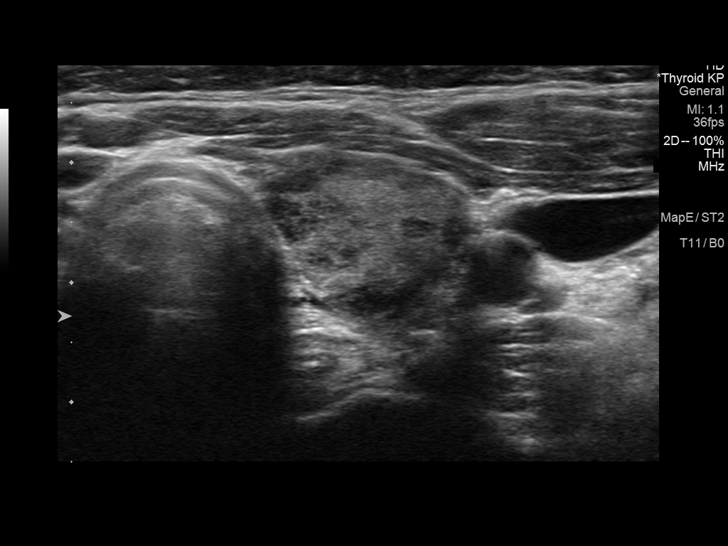
[im 31/42]
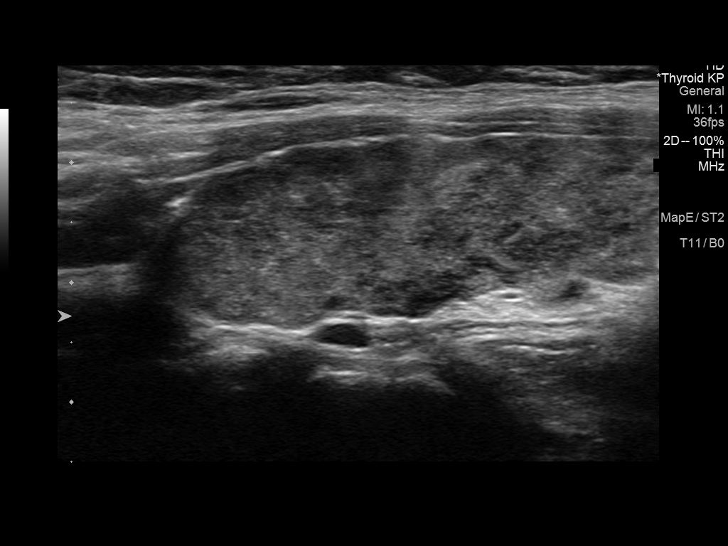
[im 35/42]
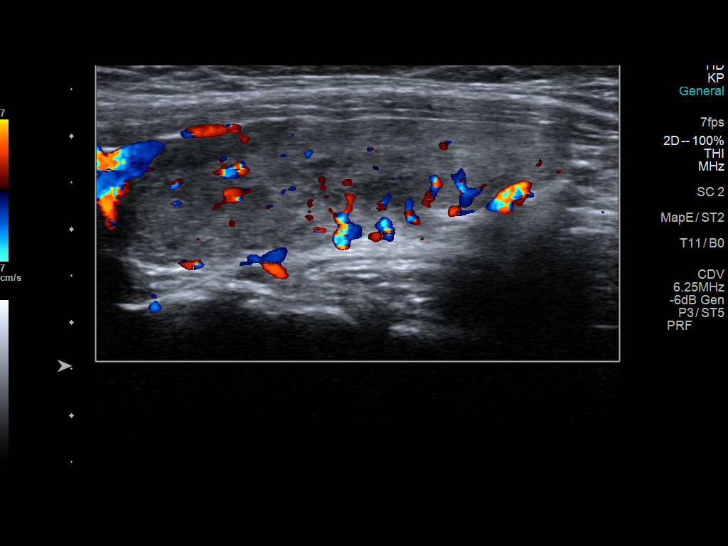
[im 38/42]
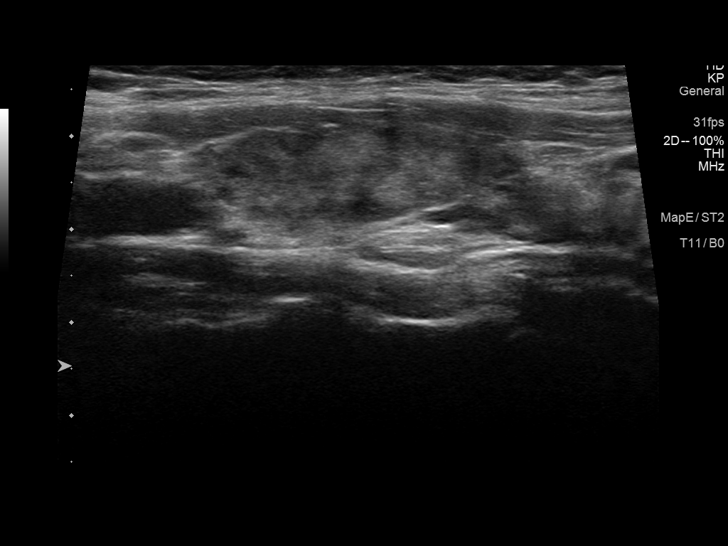
[im 42/42]
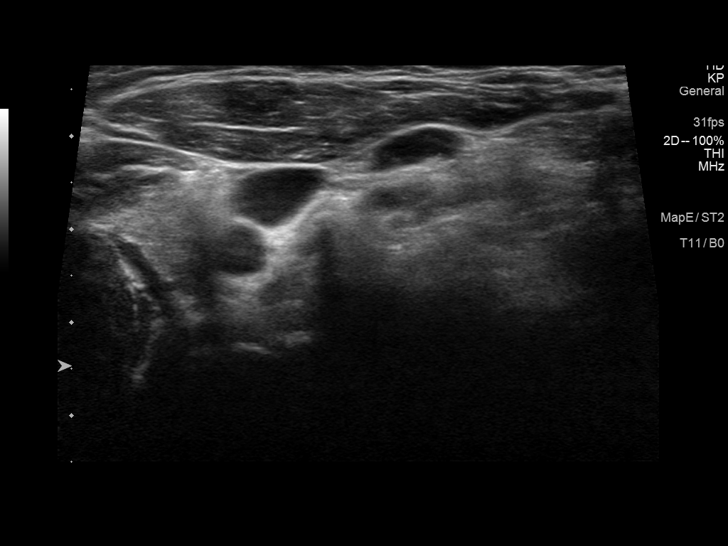

[14 of 25 positions shown; findings below may reference images not displayed]

FINDINGS: Parenchymal Echotexture: Markedly heterogenous - no definitive
glandular hyperemia.

Isthmus: Normal in size measures 0.6 cm in diameter

Right lobe: Normal in size measuring 4.8 x 1.9 x 2.0 cm

Left lobe: Normal in size measuring 5.2 x 1.6 x 1.7 cm

_________________________________________________________

Estimated total number of nodules >/= 1 cm: 0

Number of spongiform nodules >/=  2 cm not described below (TR1): 0

Number of mixed cystic and solid nodules >/= 1.5 cm not described
below (TR2): 0

_________________________________________________________

No discrete nodules are seen within the thyroid gland.
IMPRESSION: Markedly heterogeneous but normal sized thyroid gland without
discrete nodule or mass. Findings are nonspecific though could be
seen in the setting of an acute thyroiditis. Clinical correlation is
advised.

## 2021-02-03 ENCOUNTER — Other Ambulatory Visit: Payer: Self-pay

## 2021-02-03 ENCOUNTER — Encounter: Payer: Self-pay | Admitting: Medical

## 2021-02-03 ENCOUNTER — Telehealth (INDEPENDENT_AMBULATORY_CARE_PROVIDER_SITE_OTHER): Payer: No Typology Code available for payment source | Admitting: Medical

## 2021-02-03 ENCOUNTER — Other Ambulatory Visit (HOSPITAL_COMMUNITY): Payer: Self-pay

## 2021-02-03 ENCOUNTER — Telehealth: Payer: Self-pay | Admitting: Internal Medicine

## 2021-02-03 ENCOUNTER — Other Ambulatory Visit: Payer: Self-pay | Admitting: Medical

## 2021-02-03 VITALS — BP 118/78 | Temp 98.5°F

## 2021-02-03 DIAGNOSIS — R110 Nausea: Secondary | ICD-10-CM

## 2021-02-03 DIAGNOSIS — G479 Sleep disorder, unspecified: Secondary | ICD-10-CM | POA: Diagnosis not present

## 2021-02-03 DIAGNOSIS — G43009 Migraine without aura, not intractable, without status migrainosus: Secondary | ICD-10-CM | POA: Diagnosis not present

## 2021-02-03 MED ORDER — ONDANSETRON HCL 4 MG PO TABS
4.0000 mg | ORAL_TABLET | Freq: Three times a day (TID) | ORAL | 1 refills | Status: DC | PRN
  Filled 2021-02-03: qty 20, 7d supply, fill #0

## 2021-02-03 MED ORDER — RIZATRIPTAN BENZOATE 10 MG PO TABS
10.0000 mg | ORAL_TABLET | ORAL | 2 refills | Status: DC | PRN
  Filled 2021-02-03: qty 10, 30d supply, fill #0

## 2021-02-03 NOTE — Telephone Encounter (Signed)
Patient called and asked if you could refill her maxalt without a visit. She has an expired bottle at home and had a migraine yesterday and it did not help get rid of her migraine. She has a hx of migraines. She asked me to look back at other visit she had to see if it was documented, and it was with her previous pcp Dr. Pearson Grippe who no longer practices and with her obgyn doctor. Please advise. I do not see this in her previous med list

## 2021-02-03 NOTE — Telephone Encounter (Signed)
Scheduled today

## 2021-02-03 NOTE — Progress Notes (Signed)
Subjective:     Patient ID: Laurie Reese, female   DOB: 1985-05-09, 35 y.o.   MRN: [id number]  This visit type was conducted due to national recommendations for restrictions regarding the COVID-19 Pandemic (e.g. social distancing) in an effort to limit this patient's exposure and mitigate transmission in our community.  Due to their co-morbid illnesses, this patient is at least at moderate risk for complications without adequate follow up.  This format is felt to be most appropriate for this patient at this time.    Documentation for virtual audio and video telecommunications through [facility] encounter:  The patient was located at home. The provider was located in the office. The patient did consent to this visit and is aware of possible charges through their insurance for this visit.  The other persons participating in this telemedicine service were none. Time spent on call was 20 minutes and in review of previous records 20 minutes total.  This virtual service is not related to other E/M service within previous 7 days.   HPI Chief Complaint  Patient presents with   migraines    Migraines- had a migraine yesterday and took expired Maxalt and it didn't help so she had to take 4 ibuprofen. Gets headaches 3-4 times a month but last migraine before yesterday was October 6th. Family history of migraines   Virtual consult for migraines.  She notes first being diagnosed back in 2011 timeframe with her prior PCP.  She does not get them very often but ran out of Maxalt.  She had a migraine yesterday.  Prior to that when her last migraine was in October.  She had 1 migraine in October that had been several months before that migraine before she had another 1.  Typically she can get bitemporal headache with pressure behind the eyes, throbbing headache, lasting for hours with photophobia and nausea.  She denies aura.  No visual disturbance, no numbness tingling or weakness.  No confusion no  slurred speech.  Often if she sleeps the headache off it will go away within a few hours.  In the past she has used over-the-counter remedies such as Tylenol ibuprofen and Excedrin.  She does get other mild headaches.  For the migraines that typically over-the-counter analgesics do not help.  Sleep works the best.  She would like a refill of Maxalt as her current supply has expired.  She denies heavy caffeine use.  She drinks a lot of water.  She is not breast-feeding or pregnant.  Her twin sister and her mother both get migraines.  She attributes her migraines right now to stress and difficult with sleep at times.  She has a 20-month-old daughter that is not sleeping through the night most nights which is limiting her sleep.  No other aggravating or relieving factors. No other complaint.  Review of Systems As in subjective    Objective:   Physical Exam Due to coronavirus pandemic stay at home measures, patient visit was virtual and they were not examined in person.   BP 118/78   Temp 98.5 F (36.9 C)   Gen: wd, wn, nad No obvious focul neurological deficit Psych: pleasant, good eye contact, answers questions appropriately      Assessment:     Encounter Diagnoses  Name Primary?   Migraine without aura and without status migrainosus, not intractable Yes   Nausea    Sleep disturbance        Plan:     Discussed limitations of virtual consult  We discussed secondary headache versus primary headaches.  Discussed migraines, symptoms, differential diagnosis, treatment options.  No worrisome red flag symptoms today.    She has used Maxalt before.  We discussed proper use of this of Zofran.  We discussed acute therapy for migraines.  We discussed reducing stress and improving sleep were possible.  Hydrate well with plenty of water throughout the day, avoid a lot of caffeine and junk food.  If headache start to get more frequent or other new symptoms then recheck otherwise recheck  in a few months  Laurie Reese was seen today for migraines.  Diagnoses and all orders for this visit:  Migraine without aura and without status migrainosus, not intractable  Nausea  Sleep disturbance  Other orders -     rizatriptan (MAXALT) 10 MG tablet; Take 1 tablet (10 mg total) by mouth as needed for migraine. May repeat in 2 hours if needed -     ondansetron (ZOFRAN) 4 MG tablet; Take 1 tablet (4 mg total) by mouth every 8 (eight) hours as needed for nausea or vomiting.    F/u prn

## 2021-02-03 NOTE — Telephone Encounter (Signed)
Pt states she has migraines without aura. not currently breast-feeding or pregnant. I will schedule her an appt

## 2021-02-20 ENCOUNTER — Encounter: Payer: No Typology Code available for payment source | Admitting: Medical

## 2021-02-21 ENCOUNTER — Encounter: Payer: No Typology Code available for payment source | Admitting: Medical

## 2021-03-04 ENCOUNTER — Ambulatory Visit (INDEPENDENT_AMBULATORY_CARE_PROVIDER_SITE_OTHER): Payer: No Typology Code available for payment source | Admitting: Medical

## 2021-03-04 ENCOUNTER — Other Ambulatory Visit: Payer: Self-pay

## 2021-03-04 ENCOUNTER — Encounter: Payer: Self-pay | Admitting: Medical

## 2021-03-04 VITALS — BP 132/88 | HR 82 | Ht 66.5 in | Wt 227.8 lb

## 2021-03-04 DIAGNOSIS — O905 Postpartum thyroiditis: Secondary | ICD-10-CM | POA: Diagnosis not present

## 2021-03-04 DIAGNOSIS — Z975 Presence of (intrauterine) contraceptive device: Secondary | ICD-10-CM

## 2021-03-04 DIAGNOSIS — G43009 Migraine without aura, not intractable, without status migrainosus: Secondary | ICD-10-CM | POA: Diagnosis not present

## 2021-03-04 DIAGNOSIS — Z6836 Body mass index (BMI) 36.0-36.9, adult: Secondary | ICD-10-CM

## 2021-03-04 DIAGNOSIS — Z13220 Encounter for screening for lipoid disorders: Secondary | ICD-10-CM

## 2021-03-04 DIAGNOSIS — Z0000 Encounter for general adult medical examination without abnormal findings: Secondary | ICD-10-CM | POA: Diagnosis not present

## 2021-03-04 DIAGNOSIS — J4520 Mild intermittent asthma, uncomplicated: Secondary | ICD-10-CM

## 2021-03-04 DIAGNOSIS — Z131 Encounter for screening for diabetes mellitus: Secondary | ICD-10-CM

## 2021-03-04 DIAGNOSIS — Z7185 Encounter for immunization safety counseling: Secondary | ICD-10-CM

## 2021-03-04 NOTE — Progress Notes (Signed)
Subjective:   HPI  Laurie Reese is a 36 y.o. female who presents for Chief Complaint  Patient presents with   Annual Exam    Medical Team Dr. Philemon Kingdom, endocrinology Dr. Brien Few, OB/Gyn Dr. Lavonna Monarch, dermatology prior Dr. Jerline Pain, optometry Dr. Sherwood Gambler, dentist Dontray Haberland, Camelia Eng, PA-C here for primary care   Concerns: Frustrated with weight.  Is exercising, eating healthy and still not back at pre-pregnancy weight.   Was around 208lb pre-pregnancy.   In the past has been on phentermine to help with weight loss, took it for 6 months.  She would not be open to vegetarian diet.   She feels like she is doing pretty good with efforts but the weight is not coming off.  Has tried intermittent fasting and keto diet but that didn't work well.  Interested in [place] or other medication to help with efforts.   She has hx/o post partum thyroiditis found on labs without symptoms.   Was never on medication for this . Is being followed by Dr. Cruzita Lederer.    No recent issues with reactive airway  Headaches have been reduced of late.    In the past had bad ear problems.  No issues currently.   Reviewed their medical, surgical, family, social, medication, and allergy history and updated chart as appropriate.   Review of Systems Constitutional: -fever, -chills, -sweats, -unexpected weight change, -decreased appetite, -fatigue Allergy: -sneezing, -itching, -congestion Dermatology: -changing moles, --rash, -lumps ENT: -runny nose, -ear pain, -sore throat, -hoarseness, -sinus pain, -teeth pain, - ringing in ears, -hearing loss, -nosebleeds Cardiology: -chest pain, -palpitations, -swelling, -difficulty breathing when lying flat, -waking up short of breath Respiratory: -cough, -shortness of breath, -difficulty breathing with exercise or exertion, -wheezing, -coughing up blood Gastroenterology: -abdominal pain, -nausea, -vomiting, -diarrhea, -constipation, -blood in stool,  -changes in bowel movement, -difficulty swallowing or eating Hematology: -bleeding, -bruising  Musculoskeletal: -joint aches, -muscle aches, -joint swelling, -back pain, -neck pain, -cramping, -changes in gait Ophthalmology: denies vision changes, eye redness, itching, discharge Urology: -burning with urination, -difficulty urinating, -blood in urine, -urinary frequency, -urgency, -incontinence Neurology: -headache, -weakness, -tingling, -numbness, -memory loss, -falls, -dizziness Psychology: -depressed mood, -agitation, -sleep problems Breast/gyn: -breast tendnerss, -discharge, -lumps, -vaginal discharge,- irregular periods, -heavy periods   Depression screen [facility] 2/9 03/04/2021 02/03/2021 10/10/2020 02/20/2020  Decreased Interest 0 0 0 0  Down, Depressed, Hopeless 0 0 0 0  PHQ - 2 Score 0 0 0 0       Objective:  BP 132/88 (BP Location: Right Arm, Patient Position: Sitting)    Pulse 82    Ht 5' 6.5" (1.689 m)    Wt 227 lb 12.8 oz (103.3 kg)    LMP 02/28/2021 Comment: irregular   SpO2 98%    BMI 36.22 kg/m   BP Readings from Last 3 Encounters:  03/04/21 132/88  02/03/21 118/78  12/23/20 122/72   Wt Readings from Last 3 Encounters:  03/04/21 227 lb 12.8 oz (103.3 kg)  12/23/20 228 lb 3.2 oz (103.5 kg)  10/10/20 223 lb 3.2 oz (101.2 kg)    General appearance: alert, no distress, WD/WN, Caucasian female Skin: scattered macules, no worrisome lesions HEENT: normocephalic, conjunctiva/corneas normal, sclerae anicteric, PERRLA, EOMi Neck: supple, no lymphadenopathy, no thyromegaly, no masses, normal ROM, no bruits Chest: non tender, normal shape and expansion Heart: RRR, normal S1, S2, no murmurs Lungs: CTA bilaterally, no wheezes, rhonchi, or rales Abdomen: +bs, soft, non tender, non distended, no masses, no hepatomegaly, no splenomegaly, no  bruits Back: non tender, normal ROM, no scoliosis Musculoskeletal: upper extremities non tender, no obvious deformity, normal ROM throughout, lower  extremities non tender, no obvious deformity, normal ROM throughout Extremities: no edema, no cyanosis, no clubbing Pulses: 2+ symmetric, upper and lower extremities, normal cap refill Neurological: alert, oriented x 3, CN2-12 intact, strength normal upper extremities and lower extremities, sensation normal throughout, DTRs 2+ throughout, no cerebellar signs, gait normal Psychiatric: normal affect, behavior normal, pleasant  Breast/gyn/rectal - deferred to gynecology   Assessment and Plan :   Encounter Diagnoses  Name Primary?   Encounter for health maintenance examination in adult Yes   Postpartum thyroiditis    Migraine without aura and without status migrainosus, not intractable    IUD (intrauterine device) in place    Vaccine counseling    Screening for lipid disorders    Screening for diabetes mellitus    BMI 36.0-36.9,adult      This visit was a preventative care visit, also known as wellness visit or routine physical.   Topics typically include healthy lifestyle, diet, exercise, preventative care, vaccinations, sick and well care, proper use of emergency dept and after hours care, as well as other concerns.     Recommendations: Continue to return yearly for your annual wellness and preventative care visits.  This gives [place] a chance to discuss healthy lifestyle, exercise, vaccinations, review your chart record, and perform screenings where appropriate.  I recommend you see your eye doctor yearly for routine vision care.  I recommend you see your dentist yearly for routine dental care including hygiene visits twice yearly.   Vaccination recommendations were reviewed Immunization History  Administered Date(s) Administered   Hepatitis B, adult 10/26/1999, 12/31/1999, 06/15/2000   HiB (PRP-OMP) 07/06/1989   Influenza Nasal 11/16/2013   Influenza Split 11/21/2010, 11/01/2012, 01/03/2014   Influenza-Unspecified 11/30/2020   MMR 07/06/1989, 09/09/1993   PFIZER(Purple  Top)SARS-COV-2 Vaccination 07/15/2019, 08/11/2019, 02/16/2020   PPD Test 05/30/2008   Tdap 05/29/2008, 07/13/2018, 06/27/2019   Varicella 07/26/2008      Screening for cancer: Colon cancer screening: Age 79  Breast cancer screening: You should perform a self breast exam monthly.   We reviewed recommendations for regular mammograms and breast cancer screening.  Cervical cancer screening: We reviewed recommendations for pap smear screening.  11/2019 pap reviewed.   Skin cancer screening: Check your skin regularly for new changes, growing lesions, or other lesions of concern Come in for evaluation if you have skin lesions of concern.  Lung cancer screening: If you have a greater than 20 pack year history of tobacco use, then you may qualify for lung cancer screening with a chest CT scan.   Please call your insurance company to inquire about coverage for this test.  We currently don't have screenings for other cancers besides breast, cervical, colon, and lung cancers.  If you have a strong family history of cancer or have other cancer screening concerns, please let me know.    Bone health: Get at least 150 minutes of aerobic exercise weekly Get weight bearing exercise at least once weekly Bone density test:  A bone density test is an imaging test that uses a type of X-ray to measure the amount of calcium and other minerals in your bones. The test may be used to diagnose or screen you for a condition that causes weak or thin bones (osteoporosis), predict your risk for a broken bone (fracture), or determine how well your osteoporosis treatment is working. The bone density test is recommended  for females 18 and older, or females or males <80 if certain risk factors such as thyroid disease, long term use of steroids such as for asthma or rheumatological issues, vitamin D deficiency, estrogen deficiency, family history of osteoporosis, self or family history of fragility fracture in first  degree relative.    Heart health: Get at least 150 minutes of aerobic exercise weekly Limit alcohol It is important to maintain a healthy blood pressure and healthy cholesterol numbers  Heart disease screening: Screening for heart disease includes screening for blood pressure, fasting lipids, glucose/diabetes screening, BMI height to weight ratio, reviewed of smoking status, physical activity, and diet.    Goals include blood pressure 120/80 or less, maintaining a healthy lipid/cholesterol profile, preventing diabetes or keeping diabetes numbers under good control, not smoking or using tobacco products, exercising most days per week or at least 150 minutes per week of exercise, and eating healthy variety of fruits and vegetables, healthy oils, and avoiding unhealthy food choices like fried food, fast food, high sugar and high cholesterol foods.    Other tests may possibly include EKG test, CT coronary calcium score, echocardiogram, exercise treadmill stress test.     Medical care options: I recommend you continue to seek care here first for routine care.  We try really hard to have available appointments Monday through Friday daytime hours for sick visits, acute visits, and physicals.  Urgent care should be used for after hours and weekends for significant issues that cannot wait till the next day.  The emergency department should be used for significant potentially life-threatening emergencies.  The emergency department is expensive, can often have long wait times for less significant concerns, so try to utilize primary care, urgent care, or telemedicine when possible to avoid unnecessary trips to the emergency department.  Virtual visits and telemedicine have been introduced since the pandemic started in 2020, and can be convenient ways to receive medical care.  We offer virtual appointments as well to assist you in a variety of options to seek medical care.    Separate significant issues  discussed: BMI - counseled on diet , exercise, consider low carb diet, and we discussed possible medication options including Qsymia, off label use of Mounjaro, Saxenda and others.  Work on getting moderate intensity exercise several days per week, add weight bearing exercise, and cut out excess carbs.  Reactive airway - no recent issues but in the past did well with Advair and albuterol with acute flareup  Migraines - no recent issues.    Post partum thyroiditis - being managed by endocrinology   Pieper was seen today for annual exam.  Diagnoses and all orders for this visit:  Encounter for health maintenance examination in adult -     Comprehensive metabolic panel -     CBC -     Lipid panel -     Hemoglobin A1c  Postpartum thyroiditis  Migraine without aura and without status migrainosus, not intractable  IUD (intrauterine device) in place  Vaccine counseling  Screening for lipid disorders -     Lipid panel  Screening for diabetes mellitus -     Hemoglobin A1c  BMI 36.0-36.9,adult    Follow-up pending labs, yearly for physical

## 2021-03-04 NOTE — Patient Instructions (Signed)
This visit was a preventative care visit, also known as wellness visit or routine physical.   Topics typically include healthy lifestyle, diet, exercise, preventative care, vaccinations, sick and well care, proper use of emergency dept and after hours care, as well as other concerns.     Recommendations: Continue to return yearly for your annual wellness and preventative care visits.  This gives [place] a chance to discuss healthy lifestyle, exercise, vaccinations, review your chart record, and perform screenings where appropriate.  I recommend you see your eye doctor yearly for routine vision care.  I recommend you see your dentist yearly for routine dental care including hygiene visits twice yearly.   Vaccination recommendations were reviewed Immunization History  Administered Date(s) Administered   Hepatitis B, adult 10/26/1999, 12/31/1999, 06/15/2000   HiB (PRP-OMP) 07/06/1989   Influenza Nasal 11/16/2013   Influenza Split 11/21/2010, 11/01/2012, 01/03/2014   Influenza-Unspecified 11/30/2020   MMR 07/06/1989, 09/09/1993   PFIZER(Purple Top)SARS-COV-2 Vaccination 07/15/2019, 08/11/2019, 02/16/2020   PPD Test 05/30/2008   Tdap 05/29/2008, 07/13/2018, 06/27/2019   Varicella 07/26/2008      Screening for cancer: Colon cancer screening: Age 36  Breast cancer screening: You should perform a self breast exam monthly.   We reviewed recommendations for regular mammograms and breast cancer screening.  Cervical cancer screening: We reviewed recommendations for pap smear screening.  11/2019 pap reviewed.   Skin cancer screening: Check your skin regularly for new changes, growing lesions, or other lesions of concern Come in for evaluation if you have skin lesions of concern.  Lung cancer screening: If you have a greater than 20 pack year history of tobacco use, then you may qualify for lung cancer screening with a chest CT scan.   Please call your insurance company to inquire about  coverage for this test.  We currently don't have screenings for other cancers besides breast, cervical, colon, and lung cancers.  If you have a strong family history of cancer or have other cancer screening concerns, please let me know.    Bone health: Get at least 150 minutes of aerobic exercise weekly Get weight bearing exercise at least once weekly Bone density test:  A bone density test is an imaging test that uses a type of X-ray to measure the amount of calcium and other minerals in your bones. The test may be used to diagnose or screen you for a condition that causes weak or thin bones (osteoporosis), predict your risk for a broken bone (fracture), or determine how well your osteoporosis treatment is working. The bone density test is recommended for females 34 and older, or females or males <95 if certain risk factors such as thyroid disease, long term use of steroids such as for asthma or rheumatological issues, vitamin D deficiency, estrogen deficiency, family history of osteoporosis, self or family history of fragility fracture in first degree relative.    Heart health: Get at least 150 minutes of aerobic exercise weekly Limit alcohol It is important to maintain a healthy blood pressure and healthy cholesterol numbers  Heart disease screening: Screening for heart disease includes screening for blood pressure, fasting lipids, glucose/diabetes screening, BMI height to weight ratio, reviewed of smoking status, physical activity, and diet.    Goals include blood pressure 120/80 or less, maintaining a healthy lipid/cholesterol profile, preventing diabetes or keeping diabetes numbers under good control, not smoking or using tobacco products, exercising most days per week or at least 150 minutes per week of exercise, and eating healthy variety of fruits  and vegetables, healthy oils, and avoiding unhealthy food choices like fried food, fast food, high sugar and high cholesterol foods.     Other tests may possibly include EKG test, CT coronary calcium score, echocardiogram, exercise treadmill stress test.     Medical care options: I recommend you continue to seek care here first for routine care.  We try really hard to have available appointments Monday through Friday daytime hours for sick visits, acute visits, and physicals.  Urgent care should be used for after hours and weekends for significant issues that cannot wait till the next day.  The emergency department should be used for significant potentially life-threatening emergencies.  The emergency department is expensive, can often have long wait times for less significant concerns, so try to utilize primary care, urgent care, or telemedicine when possible to avoid unnecessary trips to the emergency department.  Virtual visits and telemedicine have been introduced since the pandemic started in 2020, and can be convenient ways to receive medical care.  We offer virtual appointments as well to assist you in a variety of options to seek medical care.    Separate significant issues discussed: BMI - counseled on diet , exercise, consider low carb diet, and we discussed possible medication options including Qsymia, off label use of Mounjaro, Saxenda and others.  Work on getting moderate intensity exercise several days per week, add weight bearing exercise, and cut out excess carbs.  Reactive airway - no recent issues but in the past did well with Advair and albuterol with acute flareup  Migraines - no recent issues.    Post partum thyroiditis - being managed by endocrinology

## 2021-03-05 ENCOUNTER — Telehealth: Payer: Self-pay | Admitting: Medical

## 2021-03-05 ENCOUNTER — Telehealth: Payer: Self-pay | Admitting: Internal Medicine

## 2021-03-05 LAB — COMPREHENSIVE METABOLIC PANEL
ALT: 17 IU/L (ref 0–32)
AST: 17 IU/L (ref 0–40)
Albumin/Globulin Ratio: 1.4 (ref 1.2–2.2)
Albumin: 4.4 g/dL (ref 3.8–4.8)
Alkaline Phosphatase: 74 IU/L (ref 44–121)
BUN/Creatinine Ratio: 16 (ref 9–23)
BUN: 16 mg/dL (ref 6–20)
Bilirubin Total: 0.3 mg/dL (ref 0.0–1.2)
CO2: 24 mmol/L (ref 20–29)
Calcium: 9.2 mg/dL (ref 8.7–10.2)
Chloride: 104 mmol/L (ref 96–106)
Creatinine, Ser: 1.01 mg/dL — ABNORMAL HIGH (ref 0.57–1.00)
Globulin, Total: 3.1 g/dL (ref 1.5–4.5)
Glucose: 88 mg/dL (ref 70–99)
Potassium: 4.1 mmol/L (ref 3.5–5.2)
Sodium: 139 mmol/L (ref 134–144)
Total Protein: 7.5 g/dL (ref 6.0–8.5)
eGFR: 75 mL/min/{1.73_m2} (ref >59)

## 2021-03-05 LAB — CBC
Hematocrit: 42.1 % (ref 34.0–46.6)
Hemoglobin: 14 g/dL (ref 11.1–15.9)
MCH: 27.7 pg (ref 26.6–33.0)
MCHC: 33.3 g/dL (ref 31.5–35.7)
MCV: 83 fL (ref 79–97)
Platelets: 277 10*3/uL (ref 150–450)
RBC: 5.05 x10E6/uL (ref 3.77–5.28)
RDW: 13.7 % (ref 11.7–15.4)
WBC: 6.5 10*3/uL (ref 3.4–10.8)

## 2021-03-05 LAB — LIPID PANEL
Chol/HDL Ratio: 4.3 ratio (ref 0.0–4.4)
Cholesterol, Total: 151 mg/dL (ref 100–199)
HDL: 35 mg/dL — ABNORMAL LOW (ref >39)
LDL Chol Calc (NIH): 98 mg/dL (ref 0–99)
Triglycerides: 96 mg/dL (ref 0–149)
VLDL Cholesterol Cal: 18 mg/dL (ref 5–40)

## 2021-03-05 LAB — HEMOGLOBIN A1C
Est. average glucose Bld gHb Est-mCnc: 108 mg/dL
Hgb A1c MFr Bld: 5.4 % (ref 4.8–5.6)

## 2021-03-05 NOTE — Telephone Encounter (Signed)
[employer] rep just came by and states that wegovy is the only one that will be approved with BMI higher than 30. He will bring samples by Friday so I can give one to patient. He said instructions is just like ozempic but just a higher dose . 0.25mg  weekly for a month, 0.5mg  weekly for a month, 1.7mg  weekly for a month then 2.4mg . if you want to go ahead and send in 1 dose of wegovy to [place] outpatient so we can start the PA process. After explaining everything to patient she is ok with this

## 2021-03-05 NOTE — Telephone Encounter (Signed)
I saw out mutual patient recently for well visit and obesity.  She is interested in [place].  Your thoughts given her prior thyroiditis?  I wanted to make sure you were ok with [facility]

## 2021-03-05 NOTE — Telephone Encounter (Signed)
Waiting on reply from Endo

## 2021-03-05 NOTE — Telephone Encounter (Signed)
Patient would like to try Mounjaro injection due to high BMI 36.2. If not covered insurance said Wegovy injection would require PA. She has tried diet and exercise, burn boot camp, phentermine in the past, keto diet, low carb diet, more water intake for the past 5 years.

## 2021-03-05 NOTE — Telephone Encounter (Signed)
Saxenda and ozempic has to have tried and fail metformin or piolitazone but since patient is not diabetic or pre diabetic she would not qualify. She might not qualify for [facility] for that reason as well but she has a friend that had a high BMI and qualified so she would like to try it first

## 2021-03-06 ENCOUNTER — Other Ambulatory Visit (HOSPITAL_COMMUNITY): Payer: Self-pay

## 2021-03-06 ENCOUNTER — Other Ambulatory Visit: Payer: Self-pay | Admitting: Medical

## 2021-03-06 MED ORDER — WEGOVY 0.25 MG/0.5ML ~~LOC~~ SOAJ
0.2500 mg | SUBCUTANEOUS | 1 refills | Status: DC
  Filled 2021-03-06: qty 2, 28d supply, fill #0

## 2021-03-06 NOTE — Telephone Encounter (Signed)
Pt was notified.  

## 2021-03-06 NOTE — Telephone Encounter (Signed)
Started PA for [facility] for patient. Waiting on a response from Covermymeds

## 2021-03-10 NOTE — Telephone Encounter (Signed)
[facility] fax Wegovy approved 03/07/21-06/04/21  Pt notified via mychart message

## 2021-03-15 ENCOUNTER — Other Ambulatory Visit (HOSPITAL_COMMUNITY): Payer: Self-pay

## 2021-03-17 ENCOUNTER — Other Ambulatory Visit: Payer: Self-pay | Admitting: Internal Medicine

## 2021-03-17 ENCOUNTER — Other Ambulatory Visit (HOSPITAL_COMMUNITY): Payer: Self-pay

## 2021-03-17 MED ORDER — WEGOVY 1 MG/0.5ML ~~LOC~~ SOAJ
1.0000 mg | SUBCUTANEOUS | 0 refills | Status: DC
  Filled 2021-03-17: qty 4, fill #0
  Filled 2021-04-29: qty 4, 60d supply, fill #0

## 2021-03-17 MED ORDER — WEGOVY 0.5 MG/0.5ML ~~LOC~~ SOAJ
0.5000 mg | SUBCUTANEOUS | 0 refills | Status: DC
  Filled 2021-03-17: qty 2, 28d supply, fill #0

## 2021-03-17 NOTE — Progress Notes (Unsigned)
Pt called and states that [facility] only had a few wegovy left and needed me to go ahead and send in medication.   Ive sent in 0.5mg  weekly for a month and then 1mg  weekly for a month

## 2021-04-02 ENCOUNTER — Telehealth: Payer: No Typology Code available for payment source | Admitting: Nurse Practitioner

## 2021-04-02 ENCOUNTER — Other Ambulatory Visit (HOSPITAL_COMMUNITY): Payer: Self-pay

## 2021-04-02 DIAGNOSIS — J0100 Acute maxillary sinusitis, unspecified: Secondary | ICD-10-CM

## 2021-04-02 MED ORDER — AMOXICILLIN-POT CLAVULANATE 875-125 MG PO TABS
1.0000 | ORAL_TABLET | Freq: Two times a day (BID) | ORAL | 0 refills | Status: DC
  Filled 2021-04-02: qty 14, 7d supply, fill #0

## 2021-04-02 NOTE — Progress Notes (Signed)

## 2021-04-17 ENCOUNTER — Telehealth: Payer: Self-pay | Admitting: Internal Medicine

## 2021-04-17 NOTE — Telephone Encounter (Signed)
Pt has lost 3 pounds so far being on wegovy. She has 2 doses left of 0.5mg . is it ok for her to go up to the 1mg  then after 4 weeks go to 1.7mg  for 4 weeks then 2.4mg 

## 2021-04-18 NOTE — Telephone Encounter (Signed)
Pt is scheduled for next week.

## 2021-04-25 ENCOUNTER — Other Ambulatory Visit: Payer: Self-pay

## 2021-04-25 ENCOUNTER — Encounter: Payer: Self-pay | Admitting: Medical

## 2021-04-25 ENCOUNTER — Ambulatory Visit (INDEPENDENT_AMBULATORY_CARE_PROVIDER_SITE_OTHER): Payer: No Typology Code available for payment source | Admitting: Medical

## 2021-04-25 VITALS — BP 110/70 | HR 89 | Wt 223.6 lb

## 2021-04-25 DIAGNOSIS — Z975 Presence of (intrauterine) contraceptive device: Secondary | ICD-10-CM | POA: Diagnosis not present

## 2021-04-25 DIAGNOSIS — L700 Acne vulgaris: Secondary | ICD-10-CM | POA: Diagnosis not present

## 2021-04-25 DIAGNOSIS — G479 Sleep disorder, unspecified: Secondary | ICD-10-CM

## 2021-04-25 DIAGNOSIS — Z6835 Body mass index (BMI) 35.0-35.9, adult: Secondary | ICD-10-CM | POA: Diagnosis not present

## 2021-04-25 NOTE — Progress Notes (Signed)
Subjective:  Laurie Reese is a 36 y.o. female who presents for Chief Complaint  Patient presents with   Weight Loss    Weight loss follow-up     Here for weight loss medication follow-up.  She started [facility] a little over a month ago.  She is lost at least 4 pounds on her scale.  She is exercising or walking, chasing her toddler child.  She has scaled back on some foods in recent weeks.  She does have a problem symptoms with excess carbs.  So far no side effects or problems with the medication.  She has completed 0.25 mg dose for 1 month and is on her third week of the 0.5 mg dose.  No other aggravating or relieving factors. No other complaint.  The following portions of the patient's history were reviewed and updated as appropriate: allergies, current medications, past family history, past medical history, past social history, past surgical history and problem list.  ROS Otherwise as in subjective above  Objective: BP 110/70    Pulse 89    Wt 223 lb 9.6 oz (101.4 kg)    BMI 35.55 kg/m   General appearance: alert, no distress, well developed, well nourished    Assessment: Encounter Diagnoses  Name Primary?   BMI 35.0-35.9,adult Yes   Sleep disturbance    Cystic acne vulgaris    IUD (intrauterine device) in place      Plan: She will complete 1 more week of Wegovy 0.5 mg dose and then increase to the Wegovy 1 mg dose.  She has had no side effects so far.  We discussed risk and benefits and proper use of medication.  Counseled on exercise strategies including more intense aerobic exercise and adding some weightbearing exercise.  Counseled a lot on diet strategies today.  She needs to be getting more vegetables, less carbs and work on serving sizes.  Her desired initial goal weight is 208 pounds.  She would like to get down to 188 pounds eventually.  Laurie Reese was seen today for weight loss.  Diagnoses and all orders for this visit:  BMI 35.0-35.9,adult  Sleep  disturbance  Cystic acne vulgaris  IUD (intrauterine device) in place    Follow up: 4-6 week weight check with nurse

## 2021-04-29 ENCOUNTER — Other Ambulatory Visit (HOSPITAL_COMMUNITY): Payer: Self-pay

## 2021-05-23 ENCOUNTER — Other Ambulatory Visit (HOSPITAL_COMMUNITY): Payer: Self-pay

## 2021-05-23 ENCOUNTER — Telehealth: Payer: Self-pay | Admitting: Internal Medicine

## 2021-05-23 MED ORDER — WEGOVY 1.7 MG/0.75ML ~~LOC~~ SOAJ
1.7000 mg | SUBCUTANEOUS | 0 refills | Status: DC
  Filled 2021-05-23: qty 3, 28d supply, fill #0

## 2021-05-23 NOTE — Telephone Encounter (Signed)
Pt is on last dose of 1mg  and needs the new dose sent in. I have sent in 1.7mg  to pharmacy  ?

## 2021-06-16 ENCOUNTER — Telehealth: Payer: Self-pay | Admitting: Internal Medicine

## 2021-06-16 ENCOUNTER — Other Ambulatory Visit (HOSPITAL_COMMUNITY): Payer: Self-pay

## 2021-06-16 MED ORDER — WEGOVY 2.4 MG/0.75ML ~~LOC~~ SOAJ
2.4000 mg | SUBCUTANEOUS | 2 refills | Status: DC
  Filled 2021-06-16 – 2021-06-25 (×2): qty 3, 28d supply, fill #0
  Filled 2021-07-23: qty 3, 28d supply, fill #1
  Filled 2021-08-13: qty 3, 28d supply, fill #2

## 2021-06-16 NOTE — Telephone Encounter (Signed)
Pt called and states that she has one more dose of the 1.7mg  and needs to have new dose sent in. She lost 7-10 pounds ?

## 2021-06-25 ENCOUNTER — Other Ambulatory Visit (HOSPITAL_COMMUNITY): Payer: Self-pay

## 2021-06-25 ENCOUNTER — Telehealth: Payer: Self-pay | Admitting: Internal Medicine

## 2021-06-25 NOTE — Telephone Encounter (Signed)
P.A. Mancel Parsons  ? ?I have submitted to covermymeds ?

## 2021-06-26 ENCOUNTER — Other Ambulatory Visit (HOSPITAL_COMMUNITY): Payer: Self-pay

## 2021-06-26 ENCOUNTER — Encounter: Payer: Self-pay | Admitting: Internal Medicine

## 2021-06-26 ENCOUNTER — Ambulatory Visit (INDEPENDENT_AMBULATORY_CARE_PROVIDER_SITE_OTHER): Payer: No Typology Code available for payment source | Admitting: Internal Medicine

## 2021-06-26 VITALS — BP 116/80 | HR 85 | Ht 66.5 in | Wt 221.2 lb

## 2021-06-26 DIAGNOSIS — O905 Postpartum thyroiditis: Secondary | ICD-10-CM

## 2021-06-26 NOTE — Patient Instructions (Signed)
Please stop at the lab.  Please return to see me as needed. 

## 2021-06-26 NOTE — Telephone Encounter (Signed)
PA approved and pt is aware

## 2021-06-26 NOTE — Progress Notes (Signed)
Patient ID: Laurie Reese, female   DOB: [birth year], 36 y.o.   MRN: [id number] ? ?This visit occurred during the SARS-CoV-2 public health emergency.  Safety protocols were in place, including screening questions prior to the visit, additional usage of staff PPE, and extensive cleaning of exam room while observing appropriate contact time as indicated for disinfecting solutions.  ? ?HPI  ?Laurie Reese is a 36 y.o.-year-old female, initially referred by her PCP, Tysinger, Camelia Eng, PA-C, returning for f/u for postpartum thyroiditis.  Last OV 6 months ago. ? ?Interim history: ?She denies tremors, palpitations, heat intolerance, unintentional weight loss, increased anxiety. ?She constipation, dry skin.  She was able to lose 7 pounds recently.  She is Wegovy, titrating up. ? ?Reviewed and addended hx: ?Patient gave birth on 09/15/2019 to healthy baby.  She was found to be thyrotoxic on labs checked postpartum.  She did not have any thyrotoxic symptoms at that time and remained asymptomatic afterwards. ? ?She had another set of thyroid tests checked in 04/2020 and a TSH was elevated, while the free thyroid hormones were normal. ? ?Repeat tests 1 month later showed an improved TSH with still normal free thyroid hormones.  She did not return for labs afterwards. ? ?I reviewed pt's thyroid tests: ?Lab Results  ?Component Value Date  ? TSH 1.63 12/23/2020  ? TSH 5.54 (H) 06/28/2020  ? TSH 8.33 (H) 05/29/2020  ? TSH <0.005 (L) 02/27/2020  ? TSH <0.005 (L) 02/20/2020  ? TSH 1.42 06/25/2016  ? TSH 1.15 06/24/2015  ? TSH 1.150 06/21/2014  ? TSH 1.229 06/15/2013  ? TSH 1.056 06/09/2012  ? FREET4 0.76 12/23/2020  ? FREET4 0.81 06/28/2020  ? FREET4 0.81 05/29/2020  ? FREET4 2.24 (H) 02/27/2020  ? FREET4 2.75 (H) 02/20/2020  ? T3FREE 3.0 12/23/2020  ? T3FREE 3.2 06/28/2020  ? T3FREE 2.8 05/29/2020  ? T3FREE 5.8 (H) 02/27/2020  ? ?TPO antithyroid antibodies were elevated, while Graves' antibodies (TSI) were not: ?Component ?     Latest Ref Rng & Units 02/27/2020  ?Thyroperoxidase Ab SerPl-aCnc ?    0 - 34 IU/mL 157 (H)  ?Thyroid Stim Immunoglobulin ?    0.00 - 0.55 IU/L <0.10  ? ?ESR was not elevated: ?Component ?    Latest Ref Rng & Units 02/27/2020  ?Sed Rate ?    0 - 32 mm/hr 12  ?  ?Thyroid ultrasound (02/26/2020) showed markedly heterogeneous gland without nodules, consistent with thyroiditis ? ?Thyroid Uptake and scan (03/07/2020): c/w subacute thyroiditis: ?CLINICAL DATA:  Postpartum thyroiditis. ?  ?FINDINGS: ?The thyroid gland shows minimal uptake and is poorly visualized. ?Moderate heterogeneous uptake. ?  ?4 hour I-123 uptake = 0.9%% (normal 5-20%) ?  ?24 hour I-123 uptake = 0.4%% (normal 10-30%) ?  ?IMPRESSION: ?Findings consistent with acute or subacute thyroiditis. ? ?Pt denies: ?- feeling nodules in neck ?- hoarseness ?- dysphagia ?- choking ? ?Pt has a FH of thyroid ds.: thyroid nodules in mother. No FH of thyroid cancer. No h/o radiation tx to head or neck. ?No steroid use. No herbal supplements. No Biotin use. ? ?Pt. also has a history of HAs. ?She has an IUD. ? ?She is a Quarry manager at [employer]. ? ?ROS: ?+ See HPI ? ?I reviewed pt's medications, allergies, PMH, social hx, family hx, and changes were documented in the history of present illness. Otherwise, unchanged from my initial visit note. ? ?Past Medical History:  ?Diagnosis Date  ? HPV vaccine counseling   ? Gardasil series  completed ...   ? HSV-1 infection   ? Maternal anemia, with delivery   ? Migraine   ? Postpartum thyroiditis 06/07/2020  ? Reactive airway disease   ? rare flare up  ? ?Past Surgical History:  ?Procedure Laterality Date  ? MOUTH SURGERY    ? TYMPANOPLASTY    ? TYMPANOSTOMY TUBE PLACEMENT    ? WISDOM TOOTH EXTRACTION    ? ?Social History  ? ?Socioeconomic History  ? Marital status: Married  ?  Spouse name: Not on file  ? Number of children: 1  ? Years of education: Not on file  ? Highest education level: Not on file  ?Occupational History  ?  Occupation: [employer]   ?Tobacco Use  ? Smoking status: Never  ? Smokeless tobacco: Never  ?Vaping Use  ? Vaping Use: Never used  ?Substance and Sexual Activity  ? Alcohol use: Not Currently  ?  Alcohol/week: 0.0 standard drinks  ? Drug use: No  ? Sexual activity: Yes  ?  Partners: Male  ?  Birth control/protection: I.U.D.  ?  Comment: NUVARING. 1st intercourse- 19, partners- 3  ?Other Topics Concern  ? Not on file  ?Social History Narrative  ? 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    03/2021.  ? ?Social Determinants of Health  ? ?Financial Resource Strain: Not on file  ?Food Insecurity: Not on file  ?Transportation Needs: Not on file  ?Physical Activity: Not on file  ?Stress: Not on file  ?Social Connections: Not on file  ?Intimate Partner Violence: Not on file  ? ?Current Outpatient Medications on File Prior to Visit  ?Medication Sig Dispense Refill  ? albuterol (VENTOLIN HFA) 108 (90 Base) MCG/ACT inhaler Inhale 2 puffs into the lungs every 6 (six) hours as needed for wheezing or shortness of breath. 8.5 g 0  ? fluticasone-salmeterol (ADVAIR DISKUS) 250-50 MCG/ACT AEPB Inhale 1 puffs into the lungs 2 (two) times daily. 60 each 0  ? levonorgestrel (MIRENA) 20 MCG/24HR IUD 1 each by Intrauterine route once.    ? omeprazole (PRILOSEC) 40 MG capsule Take 1 capsule (40 mg total) by mouth daily. 30 capsule 0  ? rizatriptan (MAXALT) 10 MG tablet Take 1 tablet (10 mg total) by mouth as needed for migraine. May repeat in 2 hours if needed 10 tablet 2  ? Semaglutide-Weight Management (WEGOVY) 2.4 MG/0.75ML SOAJ Inject 2.4 mg into the skin once a week. 3 mL 2  ? ?No current facility-administered medications on file prior to visit.  ? ?Allergies  ?Allergen Reactions  ? Celexa [Citalopram] Swelling  ?  Swelling on face  ? Ciprodex [Ciprofloxacin-Dexamethasone] Swelling  ?  Swelling on face  ? ?Family History  ?Problem Relation Age of Onset  ?  Hypertension Mother   ? Diabetes Mother   ? Diabetes Father   ? Crohn's disease Sister   ? Cancer Maternal Aunt   ?     breast  ? Heart disease Maternal Grandmother   ? Cancer Maternal Grandfather   ?     prostate  ? Diabetes Maternal Grandfather   ? Hypertension Maternal Grandfather   ? Heart disease Maternal Grandfather   ? Prostate cancer Maternal Grandfather   ? Colon cancer Neg Hx   ? ?PE: ?BP 116/80 (BP Location: Left Arm, Patient Position: Sitting, Cuff Size: Normal)   Pulse 85   Ht 5' 6.5" (1.689 m)   Wt 221 lb 3.2  oz (100.3 kg)   SpO2 97%   BMI 35.17 kg/m?  ?Wt Readings from Last 3 Encounters:  ?06/26/21 221 lb 3.2 oz (100.3 kg)  ?04/25/21 223 lb 9.6 oz (101.4 kg)  ?03/04/21 227 lb 12.8 oz (103.3 kg)  ? ?Constitutional: overweight, in NAD ?Eyes: PERRLA, EOMI, no exophthalmos, no stare ?ENT: moist mucous membranes, no thyromegaly, no thyroid bruit, no cervical lymphadenopathy ?Cardiovascular: RRR, No MRG ?Respiratory: CTA B ?Musculoskeletal: no deformities, strength intact in all 4 ?Skin: moist, warm, no rashes ?Neurological: no tremor with outstretched hands, DTR normal in all 4 ? ?ASSESSMENT: ?1. Postpartum thyroiditis  ?- resolved ? ?2.  Obesity class II ? ?PLAN:  ?1. Patient with history of postpartum thyrotoxicosis, diagnosed after she gave birth in 08/2019.  She did not have any thyrotoxic symptoms (weight loss, heat intolerance, hyper defecation, palpitations, anxiety).  She was referred to endocrinology and we checked her Graves' antibodies, which returned normal.  Also, a thyroid uptake and scan was not consistent with Graves' disease, but instead with thyroiditis.  No treatment was required at that time since she was asymptomatic, but we continued to follow her TFTs. TSH increased to 8.33 before last visit while free thyroid hormones were normal.  We discussed at that time that this is not unusual with resolution of thyroiditis and discussed about repeating TFTs in a month.  TSH improved and  was only slightly elevated  in 06/2020, at 5.54.  At last visit, TFTs normalized. ?-At today's visit, she remains asymptomatic.  She was able to lose weight since last visit, on Wegovy. ?-We did discuss in the past abo

## 2021-06-27 ENCOUNTER — Telehealth: Payer: Self-pay

## 2021-06-27 LAB — TSH: TSH: 1.88 u[IU]/mL (ref 0.35–5.50)

## 2021-06-27 LAB — T4, FREE: Free T4: 0.77 ng/dL (ref 0.60–1.60)

## 2021-06-27 LAB — T3, FREE: T3, Free: 2.9 pg/mL (ref 2.3–4.2)

## 2021-06-27 NOTE — Telephone Encounter (Signed)
P.A. WEGOVY approved til 06/25/22 pt has already been informed

## 2021-07-24 ENCOUNTER — Other Ambulatory Visit (HOSPITAL_COMMUNITY): Payer: Self-pay

## 2021-07-29 ENCOUNTER — Ambulatory Visit (INDEPENDENT_AMBULATORY_CARE_PROVIDER_SITE_OTHER): Payer: No Typology Code available for payment source | Admitting: Medical

## 2021-07-29 ENCOUNTER — Other Ambulatory Visit (HOSPITAL_COMMUNITY): Payer: Self-pay

## 2021-07-29 VITALS — BP 110/70 | HR 83 | Temp 97.5°F | Wt 219.8 lb

## 2021-07-29 DIAGNOSIS — H66012 Acute suppurative otitis media with spontaneous rupture of ear drum, left ear: Secondary | ICD-10-CM

## 2021-07-29 MED ORDER — RIZATRIPTAN BENZOATE 10 MG PO TABS
10.0000 mg | ORAL_TABLET | ORAL | 2 refills | Status: DC | PRN
  Filled 2021-07-29: qty 10, 30d supply, fill #0

## 2021-07-29 MED ORDER — CLARITHROMYCIN 500 MG PO TABS
500.0000 mg | ORAL_TABLET | Freq: Two times a day (BID) | ORAL | 0 refills | Status: DC
  Filled 2021-07-29: qty 20, 10d supply, fill #0

## 2021-07-29 NOTE — Progress Notes (Signed)
Subjective:  Laurie Reese is a 36 y.o. female who presents for Chief Complaint  Patient presents with   ear infection     Left ear infection. Had provider at her work look at this     Here for ear infection.   Over last few days has felt clogged and some discomfort in left ear.  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  She doesn't have a lot of pain, and otherwise no symptoms.   No fever, no sinus pressure, no sore throat, no cough, no nausea or vomiting or diarrhea.  She has a daughter in daycare.  She had another ear infection maybe a few months ago.  She has a history of TM tubes as a child and tympanoplasty in 2012.  No other aggravating or relieving factors.    No other c/o.  The following portions of the patient's history were reviewed and updated as appropriate: allergies, current medications, past family history, past medical history, past social history, past surgical history and problem list.  ROS Otherwise as in subjective above  Objective: BP 110/70   Pulse 83   Temp (!) 97.5 F (36.4 C)   Wt 219 lb 12.8 oz (99.7 kg)   BMI 34.95 kg/m   General appearance: alert, no distress, well developed, well nourished HEENT: normocephalic, sclerae anicteric, conjunctiva pink and moist, left TM with erythema and purulent discharge with presumable rupture, right TM with fullness appearing without erythema, nares patent, no discharge or erythema, pharynx normal Oral cavity: MMM, no lesions Neck: supple, no lymphadenopathy, no thyromegaly, no masses   Assessment: Encounter Diagnosis  Name Primary?   Acute suppurative otitis media of left ear with spontaneous rupture of tympanic membrane, recurrence not specified Yes     Plan: Begin Biaxin antibiotic.  She did not want to use amoxicillin as she feels like it does not help as well for her.  Consider over-the-counter decongestant as well.  Hydrate well.  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  Refilled Maxalt at her request  Laurie Reese was seen today for ear infection .  Diagnoses and all orders for this visit:  Acute suppurative otitis media of left ear with spontaneous rupture of tympanic membrane, recurrence not specified  Other orders -     clarithromycin (BIAXIN) 500 MG tablet; Take 1 tablet (500 mg total) by mouth 2 (two) times daily. -     Discontinue: rizatriptan (MAXALT) 10 MG tablet; Take 1 tablet (10 mg total) by mouth as needed for migraine. May repeat in 2 hours if needed -     rizatriptan (MAXALT) 10 MG tablet; Take 1 tablet (10 mg total) by mouth as needed for migraine. May repeat in 2 hours if needed   Follow up: 10 -14 days

## 2021-08-04 ENCOUNTER — Telehealth: Payer: Self-pay | Admitting: Internal Medicine

## 2021-08-04 ENCOUNTER — Other Ambulatory Visit: Payer: Self-pay | Admitting: Medical

## 2021-08-04 ENCOUNTER — Other Ambulatory Visit (HOSPITAL_COMMUNITY): Payer: Self-pay

## 2021-08-04 MED ORDER — SULFAMETHOXAZOLE-TRIMETHOPRIM 800-160 MG PO TABS
1.0000 | ORAL_TABLET | Freq: Two times a day (BID) | ORAL | 0 refills | Status: DC
  Filled 2021-08-04: qty 14, 7d supply, fill #0

## 2021-08-04 NOTE — Telephone Encounter (Signed)
Power is out at office but shane has switched medication and asked that pt contact ENT

## 2021-08-04 NOTE — Telephone Encounter (Signed)
Pt called and states that she is still getting pus out of her ear. She is not sure that the antibiotic is working well. She had Larita Fife NP-C at her work to look at it and said its not as bad but its still infected with pus. She said it still feels clogged. Do you recommend another antibiotic? She has 3 days left of meds

## 2021-08-07 ENCOUNTER — Telehealth: Payer: Self-pay | Admitting: Internal Medicine

## 2021-08-07 DIAGNOSIS — H9212 Otorrhea, left ear: Secondary | ICD-10-CM

## 2021-08-07 DIAGNOSIS — H66012 Acute suppurative otitis media with spontaneous rupture of ear drum, left ear: Secondary | ICD-10-CM

## 2021-08-07 NOTE — Telephone Encounter (Signed)
Patient called and states that she had Dr. Ardyth Man at her office look at her ear again and its been waking her up at night and said its still got fluid and pus and that the antibiotic isn't working. Dr. Ardyth Man thinks she needs to go back on Augmentin. I was able to call and get her in with ENT on Monday but she needs something until then.

## 2021-08-08 ENCOUNTER — Other Ambulatory Visit (HOSPITAL_COMMUNITY): Payer: Self-pay

## 2021-08-08 ENCOUNTER — Other Ambulatory Visit: Payer: Self-pay | Admitting: Medical

## 2021-08-08 MED ORDER — AMOXICILLIN-POT CLAVULANATE 875-125 MG PO TABS
1.0000 | ORAL_TABLET | Freq: Two times a day (BID) | ORAL | 0 refills | Status: DC
  Filled 2021-08-08: qty 20, 10d supply, fill #0

## 2021-08-08 NOTE — Telephone Encounter (Signed)
Pt was notified. Pt will just try augmentin and wait until Monday at ENT

## 2021-08-11 ENCOUNTER — Other Ambulatory Visit (HOSPITAL_COMMUNITY): Payer: Self-pay

## 2021-08-11 MED ORDER — NEOMYCIN-POLYMYXIN-HC 3.5-10000-1 OT SUSP
OTIC | 1 refills | Status: DC
  Filled 2021-08-11: qty 10, 7d supply, fill #0

## 2021-08-13 ENCOUNTER — Other Ambulatory Visit (HOSPITAL_COMMUNITY): Payer: Self-pay

## 2021-08-14 ENCOUNTER — Other Ambulatory Visit (HOSPITAL_COMMUNITY): Payer: Self-pay

## 2021-08-31 IMAGING — CR DG CHEST 2V
2 series · 2 of 2 positions shown · non-contrast
Comparison: None.

CLINICAL DATA: Chronic

EXAM:
CHEST - 2 VIEW

[w chest pa]
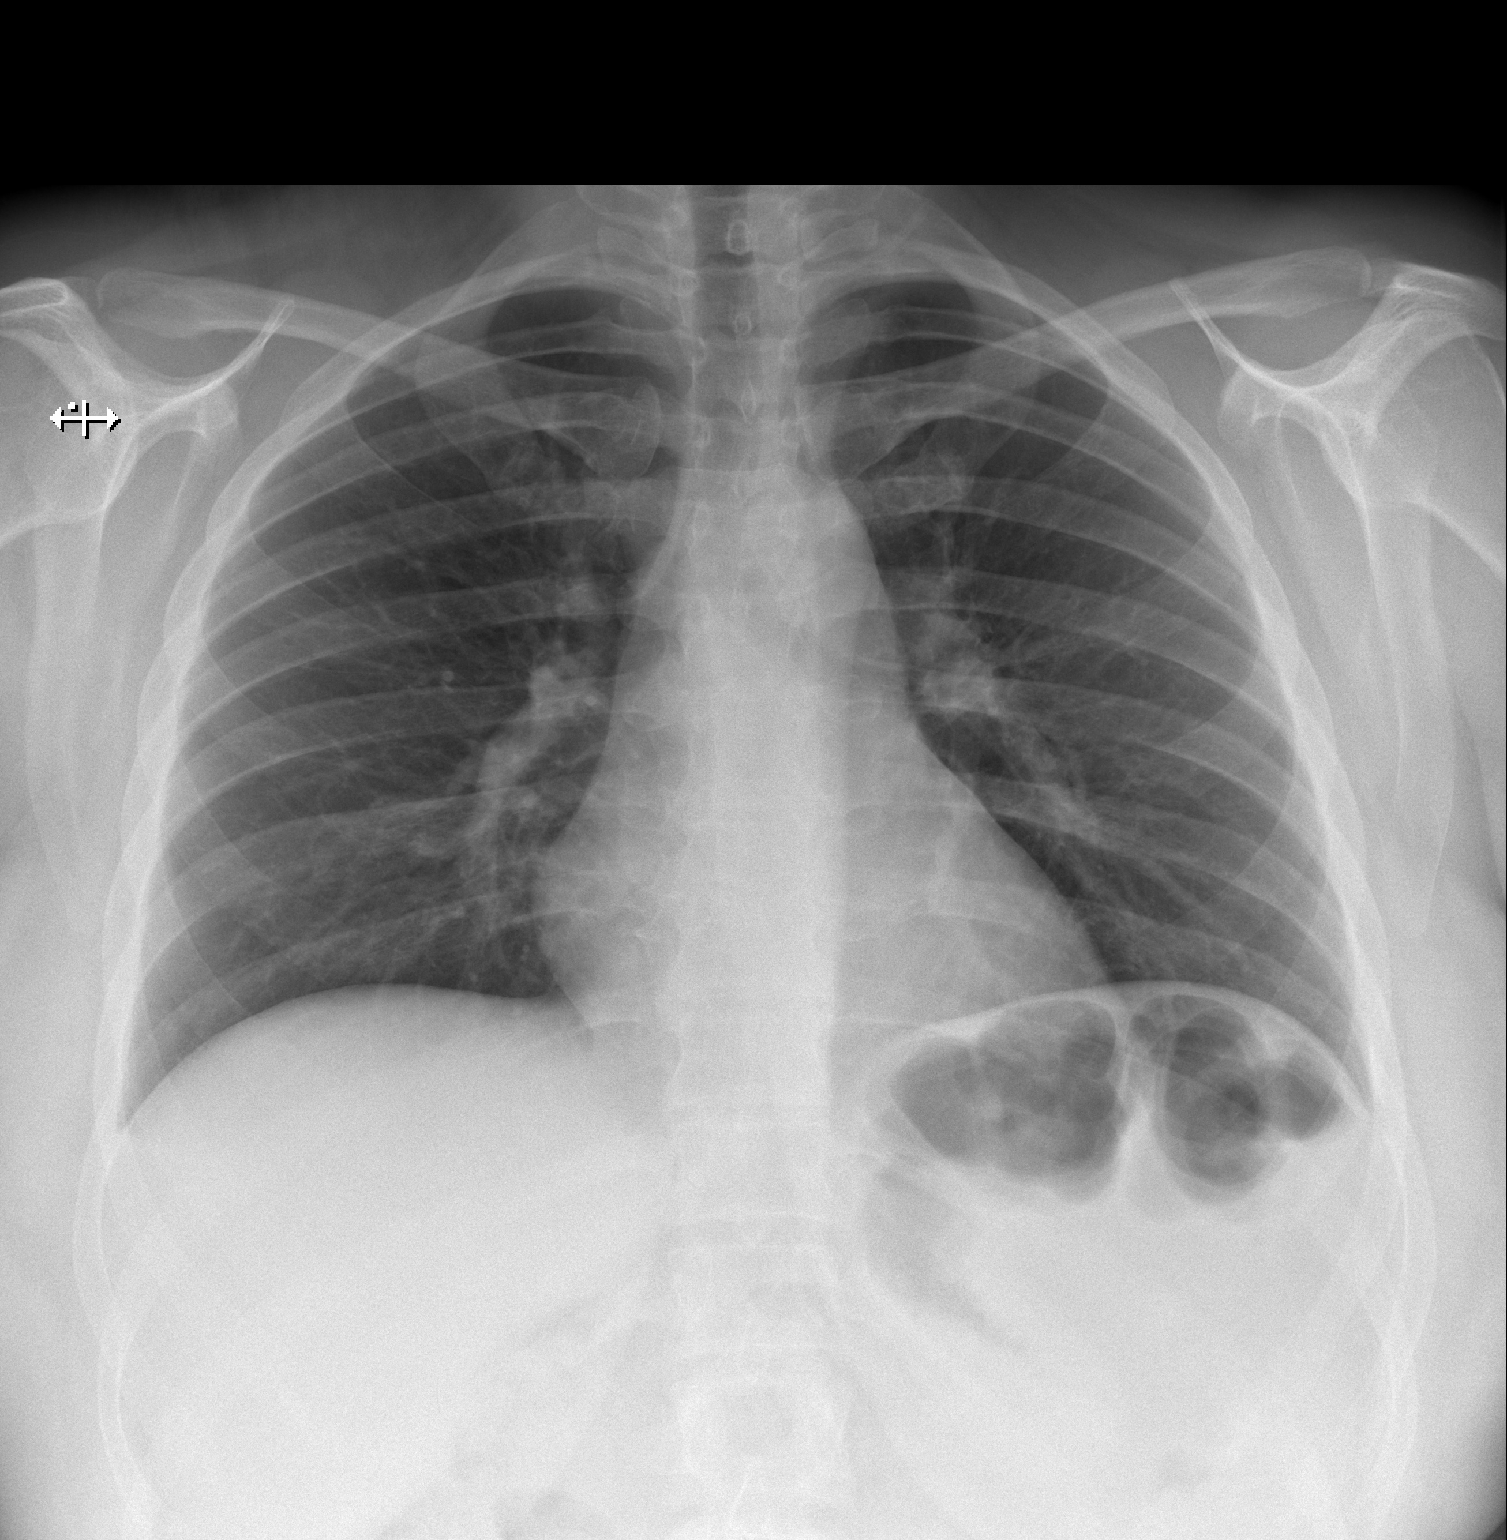

[w chest lat]
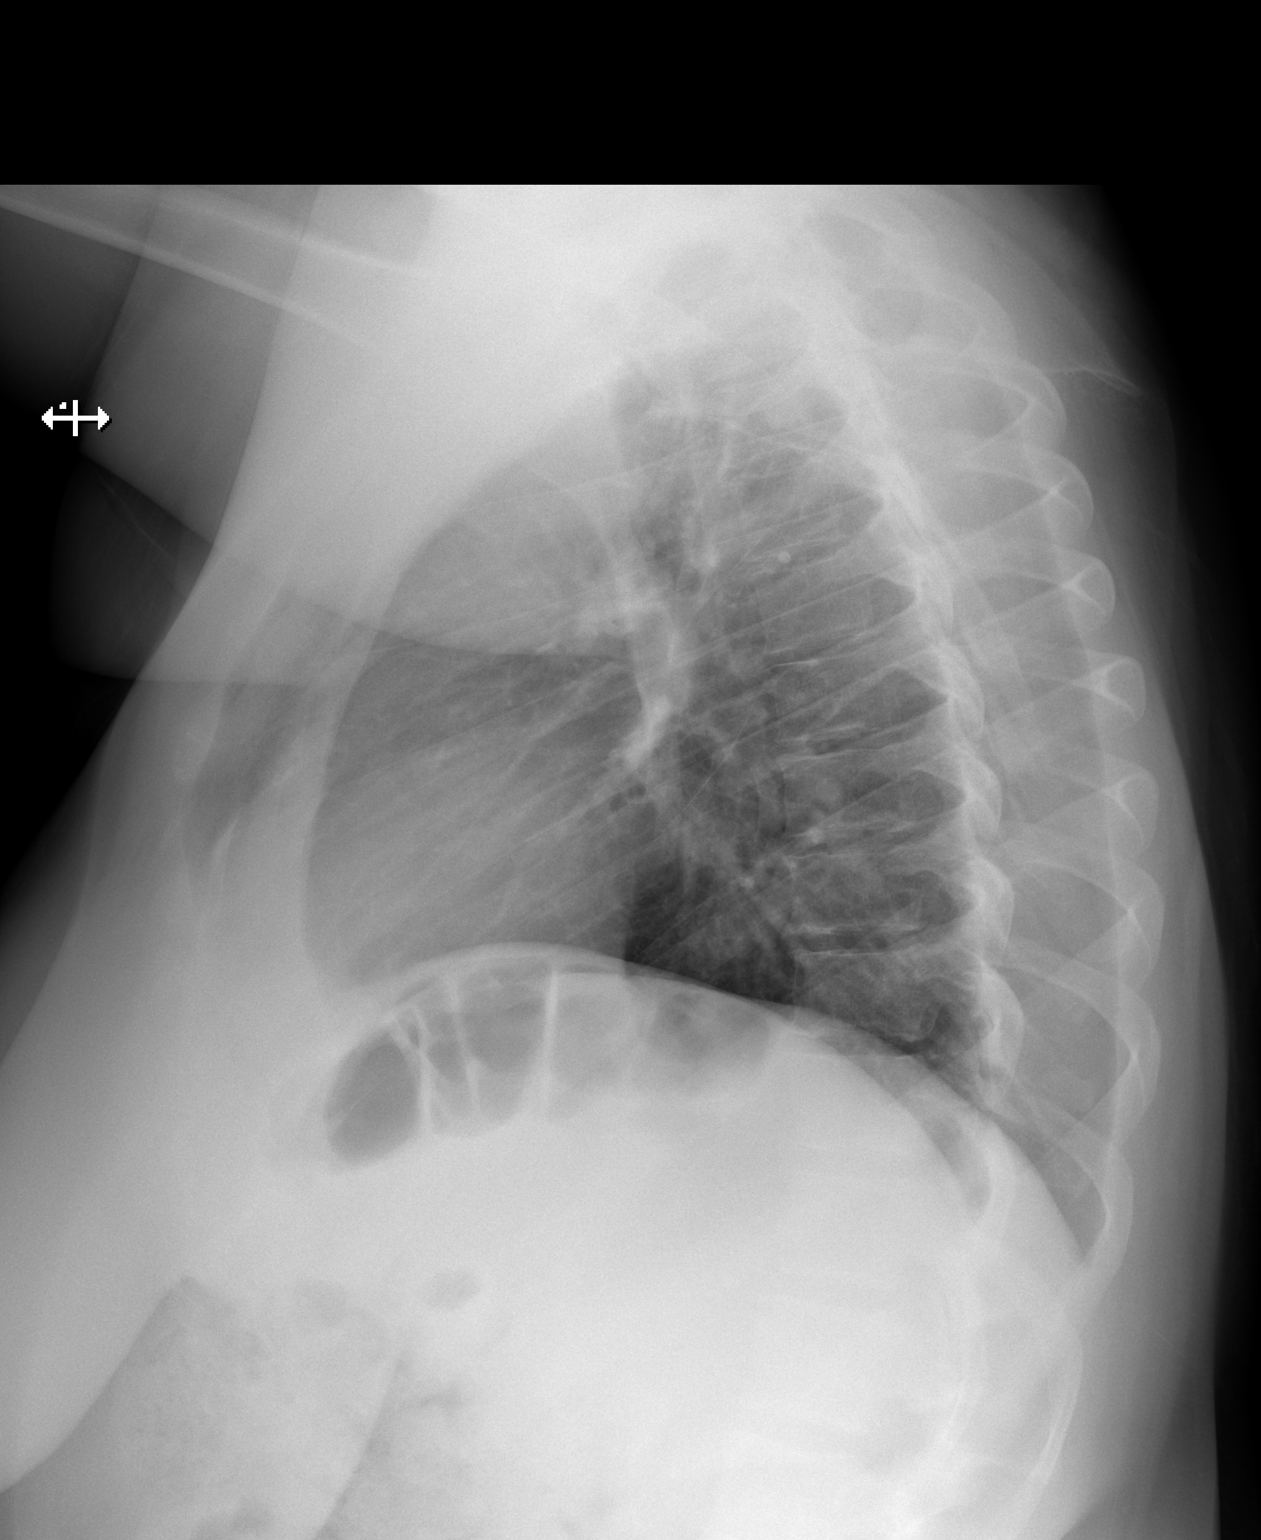

[2 of 2 positions shown; findings below may reference images not displayed]

FINDINGS: The heart size and mediastinal contours are within normal limits.
Both lungs are clear. The visualized skeletal structures are
unremarkable.
IMPRESSION: No active cardiopulmonary disease.

## 2021-09-15 ENCOUNTER — Other Ambulatory Visit: Payer: Self-pay | Admitting: Medical

## 2021-09-15 ENCOUNTER — Other Ambulatory Visit (HOSPITAL_COMMUNITY): Payer: Self-pay

## 2021-09-15 MED ORDER — WEGOVY 2.4 MG/0.75ML ~~LOC~~ SOAJ
2.4000 mg | SUBCUTANEOUS | 0 refills | Status: DC
  Filled 2021-09-15: qty 3, 28d supply, fill #0

## 2021-09-15 NOTE — Telephone Encounter (Signed)
I will refill this as pt is going to schedule a visit to follow-up on weight

## 2021-09-19 ENCOUNTER — Ambulatory Visit (INDEPENDENT_AMBULATORY_CARE_PROVIDER_SITE_OTHER): Payer: No Typology Code available for payment source | Admitting: Medical

## 2021-09-19 ENCOUNTER — Other Ambulatory Visit (HOSPITAL_COMMUNITY): Payer: Self-pay

## 2021-09-19 VITALS — BP 120/70 | HR 72 | Wt 214.6 lb

## 2021-09-19 DIAGNOSIS — Z6834 Body mass index (BMI) 34.0-34.9, adult: Secondary | ICD-10-CM

## 2021-09-19 MED ORDER — WEGOVY 2.4 MG/0.75ML ~~LOC~~ SOAJ
2.4000 mg | SUBCUTANEOUS | 2 refills | Status: DC
  Filled 2021-09-19 – 2021-10-06 (×2): qty 3, 28d supply, fill #0
  Filled 2021-11-10: qty 3, 28d supply, fill #1
  Filled 2021-12-09: qty 3, 28d supply, fill #2

## 2021-09-19 NOTE — Progress Notes (Signed)
Subjective:  Laurie Reese is a 36 y.o. female who presents for Chief Complaint  Patient presents with   f/u wegovy    F/u on wegovy     Here for weight loss medication follow-up.  She started [facility] early in 2023.  Started at 227lb.  Goal weight 190lb.   Stil doing fine on Wegoy high dose weekly.   Definitely helps with not feeling hungry or cravings . Exercising with walking and keeping up with 2 yo child.   Is careful with diet, portion control, no specific diet plan otherwise.    She is trying to get to goal before her trip in October to [place]. No other complaint.  The following portions of the patient's history were reviewed and updated as appropriate: allergies, current medications, past family history, past medical history, past social history, past surgical history and problem list.  ROS Otherwise as in subjective above  Objective: BP 120/70   Pulse 72   Wt 214 lb 9.6 oz (97.3 kg)   BMI 34.12 kg/m   General appearance: alert, no distress, well developed, well nourished    Assessment: Encounter Diagnosis  Name Primary?   BMI 34.0-34.9,adult Yes      Plan: Counseled on some strategies with exercise, including weight bearing and aerobic, some other eating plan strategies such as Whole 30, weight watchers, [employer] or other.  C/t wegovy.  Her desired initial goal weight is 209 pounds.    Season was seen today for f/u wegovy.  Diagnoses and all orders for this visit:  BMI 34.0-34.9,adult  Other orders -     Semaglutide-Weight Management (WEGOVY) 2.4 MG/0.75ML SOAJ; Inject 2.4 mg into the skin once a week.    Follow up: 2-3 mo

## 2021-10-06 ENCOUNTER — Telehealth: Payer: No Typology Code available for payment source | Admitting: Physician Assistant

## 2021-10-06 DIAGNOSIS — B3731 Acute candidiasis of vulva and vagina: Secondary | ICD-10-CM

## 2021-10-07 ENCOUNTER — Other Ambulatory Visit (HOSPITAL_COMMUNITY): Payer: Self-pay

## 2021-10-07 MED ORDER — FLUCONAZOLE 150 MG PO TABS
150.0000 mg | ORAL_TABLET | Freq: Once | ORAL | 0 refills | Status: AC
  Filled 2021-10-07: qty 1, 1d supply, fill #0

## 2021-10-07 NOTE — Progress Notes (Signed)
I have spent 5 minutes in review of e-visit questionnaire, review and updating patient chart, medical decision making and response to patient.   Stokes Rattigan Cody Tayloranne Lekas, PA-C    

## 2021-10-07 NOTE — Progress Notes (Signed)

## 2021-11-05 ENCOUNTER — Encounter: Payer: Self-pay | Admitting: Internal Medicine

## 2021-11-10 ENCOUNTER — Other Ambulatory Visit (HOSPITAL_COMMUNITY): Payer: Self-pay

## 2021-11-19 ENCOUNTER — Other Ambulatory Visit: Payer: Self-pay | Admitting: Medical

## 2021-11-19 ENCOUNTER — Other Ambulatory Visit (HOSPITAL_COMMUNITY): Payer: Self-pay

## 2021-11-19 ENCOUNTER — Ambulatory Visit (INDEPENDENT_AMBULATORY_CARE_PROVIDER_SITE_OTHER): Payer: No Typology Code available for payment source | Admitting: Medical

## 2021-11-19 VITALS — BP 110/70 | HR 95 | Temp 98.0°F | Wt 213.0 lb

## 2021-11-19 DIAGNOSIS — L732 Hidradenitis suppurativa: Secondary | ICD-10-CM

## 2021-11-19 MED ORDER — CLINDAMYCIN PHOS-BENZOYL PEROX 1-5 % EX GEL
Freq: Two times a day (BID) | CUTANEOUS | 1 refills | Status: DC
  Filled 2021-11-19: qty 25, 15d supply, fill #0

## 2021-11-19 MED ORDER — CLINDAMYCIN PHOSPHATE 1 % EX GEL
Freq: Two times a day (BID) | CUTANEOUS | 1 refills | Status: DC
  Filled 2021-11-19: qty 60, 30d supply, fill #0

## 2021-11-19 MED ORDER — MINOCYCLINE HCL 100 MG PO TABS
100.0000 mg | ORAL_TABLET | Freq: Two times a day (BID) | ORAL | 1 refills | Status: DC
  Filled 2021-11-19: qty 60, 30d supply, fill #0

## 2021-11-19 MED ORDER — DOXYCYCLINE HYCLATE 100 MG PO TBEC
100.0000 mg | DELAYED_RELEASE_TABLET | Freq: Two times a day (BID) | ORAL | 1 refills | Status: DC
  Filled 2021-11-19: qty 60, 30d supply, fill #0

## 2021-11-19 NOTE — Patient Instructions (Signed)
Begin Clindagel topical twice daily for the next month or longer  If you get a flare up, start Minocycline 100mg  twice daily for a week  Alternatively you could start Minocycline 100mg  once daily for maintenance  I will put in referral back to dermatology

## 2021-11-19 NOTE — Progress Notes (Signed)
Subjective:  Laurie Reese is a 36 y.o. female who presents for Chief Complaint  Patient presents with   HS Flare up under arms and sides    HS has flared up under arms and side and in groin area, she has a rash under her arms      Here for skin concerns.  She notes a history of hidradenitis for years.  Has seen dermatology in the past for this.  In the past year she has not had as many flareups thankfully but she has had flareups on and off for several years.  Prior medications have included oral doxycycline, oral minocycline, oral Accutane, other oral antibiotics, different topical creams including compounded cream, steroid injection intralesional.  She has discussed things like Humira but that was not recommended past as it was not quite as severe.  No prior consult with plastic surgery.  She has areas in the armpits around the abdomen on the right back and in the upper thigh area. No other aggravating or relieving factors.    No other c/o.  The following portions of the patient's history were reviewed and updated as appropriate: allergies, current medications, past family history, past medical history, past social history, past surgical history and problem list.  ROS Otherwise as in subjective above  Objective: BP 110/70   Pulse 95   Temp 98 F (36.7 C)   Wt 213 lb (96.6 kg)   BMI 33.86 kg/m   General appearance: alert, no distress, well developed, well nourished Skin: Bilateral axillary regions with prior scarring from prior hidradenitis lesions, there is some cystic nodules today but no significant fluctuance or abscess or erythema, right flank posteriorly with raised erythematous papular 3 mm lesion, upper inner thighs with some mild few cystic erythematous lesions but no fluctuance warmth or induration Exam chaperoned by nurse   Assessment: Encounter Diagnosis  Name Primary?   Hidradenitis Yes     Plan: Begin topical benzaclin gel.  If this works well enough we will  continue this for the time being.  If worsening flareup can begin minocycline for flareup dosing for 1 week twice a day or can begin once daily for maintenance.  Referral back to dermatology for further evaluation.  She saw dermatology in the past but that office closed within the past few months.  [name] was seen today for hs flare up under arms and sides.  Diagnoses and all orders for this visit:  Hidradenitis -     Ambulatory referral to Dermatology  Other orders -     Discontinue: clindamycin (CLINDAGEL) 1 % gel; APPLY 1 APPLICATION TO THE AFFECTED AREA(S) TWICE DAILY. -     minocycline (DYNACIN) 100 MG tablet; Take 1 tablet (100 mg total) by mouth 2 (two) times daily. BID for acute flare up x 7 days or daily for 30 days for maintenance -     clindamycin-benzoyl peroxide (BENZACLIN) gel; Apply topically 2 (two) times daily.    Follow up: with dermatology

## 2021-11-20 ENCOUNTER — Other Ambulatory Visit (HOSPITAL_COMMUNITY): Payer: Self-pay

## 2021-11-20 ENCOUNTER — Other Ambulatory Visit: Payer: Self-pay | Admitting: Medical

## 2021-11-20 MED ORDER — DOXYCYCLINE MONOHYDRATE 100 MG PO CAPS
100.0000 mg | ORAL_CAPSULE | Freq: Two times a day (BID) | ORAL | 0 refills | Status: DC
  Filled 2021-11-20: qty 60, 45d supply, fill #0

## 2021-11-20 MED ORDER — DOXYCYCLINE HYCLATE 100 MG PO TBEC
100.0000 mg | DELAYED_RELEASE_TABLET | Freq: Two times a day (BID) | ORAL | 1 refills | Status: DC
  Filled 2021-11-20: qty 60, 30d supply, fill #0

## 2021-11-21 ENCOUNTER — Other Ambulatory Visit (HOSPITAL_COMMUNITY): Payer: Self-pay

## 2021-11-21 MED ORDER — CLINDAMYCIN PHOSPHATE 1 % EX SWAB
CUTANEOUS | 2 refills | Status: DC
  Filled 2021-11-21: qty 60, 30d supply, fill #0
  Filled 2022-02-27: qty 60, 30d supply, fill #1

## 2021-11-21 NOTE — Telephone Encounter (Signed)
Spoke with Lurena Joiner at pharmacy and he told me to put in swab to be covered

## 2021-11-24 ENCOUNTER — Telehealth: Payer: Self-pay | Admitting: Internal Medicine

## 2021-11-24 ENCOUNTER — Other Ambulatory Visit: Payer: Self-pay | Admitting: Medical

## 2021-11-24 ENCOUNTER — Other Ambulatory Visit: Payer: No Typology Code available for payment source

## 2021-11-24 ENCOUNTER — Other Ambulatory Visit (HOSPITAL_COMMUNITY): Payer: Self-pay

## 2021-11-24 DIAGNOSIS — L732 Hidradenitis suppurativa: Secondary | ICD-10-CM

## 2021-11-24 MED ORDER — CEPHALEXIN 500 MG PO CAPS
500.0000 mg | ORAL_CAPSULE | Freq: Two times a day (BID) | ORAL | 2 refills | Status: DC
  Filled 2021-11-24: qty 60, 30d supply, fill #0
  Filled 2022-02-03: qty 60, 30d supply, fill #1

## 2021-11-24 MED ORDER — UBRELVY 100 MG PO TABS
1.0000 | ORAL_TABLET | Freq: Every day | ORAL | 0 refills | Status: DC | PRN
  Filled 2021-11-24: qty 30, 90d supply, fill #0

## 2021-11-24 NOTE — Telephone Encounter (Signed)
Pt.notified

## 2021-11-24 NOTE — Telephone Encounter (Signed)
Pt called and would like to know if you can prescribe something else other than Maxalt. Pt says maxalt is not working as well and yesterday had to do a migraine cocktail- tylenol, benadryl and zofran and sleep it off but doesn't want to take it everytime she has a mirgraine since the [facility] doesn't really help anymore. [facility]

## 2021-11-25 ENCOUNTER — Telehealth: Payer: Self-pay | Admitting: Internal Medicine

## 2021-11-25 ENCOUNTER — Other Ambulatory Visit (HOSPITAL_COMMUNITY): Payer: Self-pay

## 2021-11-25 NOTE — Telephone Encounter (Signed)
Working on [employer]A. for Ubrelvy   I have samples that patient will pick up today of Ubrelvy while we work on P.A.

## 2021-11-25 NOTE — Telephone Encounter (Signed)
This has been approved. I will fax to [facility]

## 2021-11-28 LAB — HEPATITIS B SURFACE ANTIGEN: Hepatitis B Surface Ag: NEGATIVE

## 2021-11-28 LAB — HEPATITIS B SURFACE ANTIBODY,QUALITATIVE: Hep B Surface Ab, Qual: REACTIVE

## 2021-11-28 LAB — QUANTIFERON-TB GOLD PLUS
QuantiFERON Mitogen Value: >10 IU/mL
QuantiFERON Nil Value: 0.05 IU/mL
QuantiFERON TB1 Ag Value: 0.09 IU/mL
QuantiFERON TB2 Ag Value: 0.06 IU/mL
QuantiFERON-TB Gold Plus: NEGATIVE

## 2021-11-28 LAB — COMPREHENSIVE METABOLIC PANEL
ALT: 12 IU/L (ref 0–32)
AST: 13 IU/L (ref 0–40)
Albumin/Globulin Ratio: 1.6 (ref 1.2–2.2)
Albumin: 4.6 g/dL (ref 3.9–4.9)
Alkaline Phosphatase: 74 IU/L (ref 44–121)
BUN/Creatinine Ratio: 15 (ref 9–23)
BUN: 13 mg/dL (ref 6–20)
Bilirubin Total: 0.3 mg/dL (ref 0.0–1.2)
CO2: 23 mmol/L (ref 20–29)
Calcium: 9.3 mg/dL (ref 8.7–10.2)
Chloride: 100 mmol/L (ref 96–106)
Creatinine, Ser: 0.88 mg/dL (ref 0.57–1.00)
Globulin, Total: 2.9 g/dL (ref 1.5–4.5)
Glucose: 97 mg/dL (ref 70–99)
Potassium: 4.2 mmol/L (ref 3.5–5.2)
Sodium: 141 mmol/L (ref 134–144)
Total Protein: 7.5 g/dL (ref 6.0–8.5)
eGFR: 89 mL/min/{1.73_m2} (ref >59)

## 2021-11-28 LAB — CBC WITH DIFFERENTIAL/PLATELET
Basophils Absolute: 0 10*3/uL (ref 0.0–0.2)
Basos: 0 %
EOS (ABSOLUTE): 0.1 10*3/uL (ref 0.0–0.4)
Eos: 1 %
Hematocrit: 43 % (ref 34.0–46.6)
Hemoglobin: 14.1 g/dL (ref 11.1–15.9)
Immature Grans (Abs): 0 10*3/uL (ref 0.0–0.1)
Immature Granulocytes: 0 %
Lymphocytes Absolute: 2.7 10*3/uL (ref 0.7–3.1)
Lymphs: 35 %
MCH: 28 pg (ref 26.6–33.0)
MCHC: 32.8 g/dL (ref 31.5–35.7)
MCV: 86 fL (ref 79–97)
Monocytes Absolute: 0.6 10*3/uL (ref 0.1–0.9)
Monocytes: 7 %
Neutrophils Absolute: 4.4 10*3/uL (ref 1.4–7.0)
Neutrophils: 57 %
Platelets: 322 10*3/uL (ref 150–450)
RBC: 5.03 x10E6/uL (ref 3.77–5.28)
RDW: 12.6 % (ref 11.7–15.4)
WBC: 7.9 10*3/uL (ref 3.4–10.8)

## 2021-11-28 LAB — HEPATITIS C ANTIBODY: Hep C Virus Ab: NONREACTIVE

## 2021-11-28 LAB — HEPATITIS B CORE ANTIBODY, TOTAL: Hep B Core Total Ab: NEGATIVE

## 2021-11-29 ENCOUNTER — Other Ambulatory Visit (HOSPITAL_COMMUNITY): Payer: Self-pay

## 2021-12-01 ENCOUNTER — Other Ambulatory Visit (HOSPITAL_COMMUNITY): Payer: Self-pay

## 2021-12-09 ENCOUNTER — Other Ambulatory Visit (HOSPITAL_COMMUNITY): Payer: Self-pay

## 2021-12-10 ENCOUNTER — Other Ambulatory Visit (HOSPITAL_COMMUNITY): Payer: Self-pay

## 2021-12-10 ENCOUNTER — Other Ambulatory Visit: Payer: Self-pay | Admitting: Medical

## 2021-12-10 ENCOUNTER — Telehealth: Payer: Self-pay | Admitting: Internal Medicine

## 2021-12-10 MED ORDER — OSELTAMIVIR PHOSPHATE 75 MG PO CAPS
75.0000 mg | ORAL_CAPSULE | Freq: Every day | ORAL | 0 refills | Status: AC
  Filled 2021-12-10: qty 10, 10d supply, fill #0

## 2021-12-10 NOTE — Telephone Encounter (Signed)
Patient was in a convention over the weekend in [employer] with a lot of people and got started feeling sick- tired, HA, however she tested negative on Monday- for covid and flu when she went to work but has now found out 7 others have tested positive for the flu. She tested positive for Flu B at work today and would like to get Tamiflu as she is leaving Friday morning for [place] with her husband for work. She feels fine now but just wants to be on the same side. [facility]

## 2021-12-10 NOTE — Telephone Encounter (Signed)
Pt.notified

## 2021-12-23 ENCOUNTER — Other Ambulatory Visit (HOSPITAL_COMMUNITY): Payer: Self-pay

## 2021-12-23 MED ORDER — AMOXICILLIN 500 MG PO CAPS
500.0000 mg | ORAL_CAPSULE | ORAL | 1 refills | Status: DC
  Filled 2021-12-23: qty 30, 10d supply, fill #0

## 2021-12-23 MED ORDER — FLUCONAZOLE 150 MG PO TABS
150.0000 mg | ORAL_TABLET | ORAL | 1 refills | Status: DC
  Filled 2021-12-23: qty 2, 3d supply, fill #0
  Filled 2022-02-03: qty 2, 3d supply, fill #1

## 2021-12-26 ENCOUNTER — Other Ambulatory Visit (HOSPITAL_COMMUNITY): Payer: Self-pay

## 2022-01-02 ENCOUNTER — Other Ambulatory Visit (HOSPITAL_COMMUNITY): Payer: Self-pay

## 2022-01-02 ENCOUNTER — Ambulatory Visit: Payer: No Typology Code available for payment source | Attending: Medical | Admitting: Pharmacist

## 2022-01-02 DIAGNOSIS — Z7189 Other specified counseling: Secondary | ICD-10-CM

## 2022-01-02 MED ORDER — HUMIRA PEN 40 MG/0.8ML ~~LOC~~ PNKT
PEN_INJECTOR | SUBCUTANEOUS | 6 refills | Status: DC
  Filled 2022-01-02: qty 4, 28d supply, fill #0

## 2022-01-02 MED ORDER — HUMIRA PEN 40 MG/0.8ML ~~LOC~~ PNKT
PEN_INJECTOR | SUBCUTANEOUS | 6 refills | Status: DC
  Filled 2022-01-02: qty 4, fill #0
  Filled 2022-01-21: qty 4, 28d supply, fill #0
  Filled 2022-02-19: qty 4, 28d supply, fill #1

## 2022-01-02 MED ORDER — HUMIRA PEN 40 MG/0.8ML ~~LOC~~ PNKT
PEN_INJECTOR | SUBCUTANEOUS | 0 refills | Status: DC
  Filled 2022-01-02: qty 3, 15d supply, fill #0
  Filled 2022-01-02: qty 6, 28d supply, fill #0

## 2022-01-02 MED ORDER — HUMIRA PEN 40 MG/0.8ML ~~LOC~~ PNKT
PEN_INJECTOR | SUBCUTANEOUS | 0 refills | Status: DC
  Filled 2022-01-02: qty 6, fill #0
  Filled 2022-01-05: qty 6, 28d supply, fill #0

## 2022-01-02 NOTE — Progress Notes (Signed)
  S: Patient presents for review of their specialty medication therapy.  Patient is about to start taking Humira for HS. Patient is managed by Lennie Odor for this.   Adherence: has not started   Efficacy: has not started   Dosing:  Hidradenitis suppurativa (Humira only): SubQ: Initial: 160 mg (given as four 40 mg injections on day 1 or given as two 40 mg injections per day over 2 consecutive days), then 80 mg 2 weeks later (day 15). Maintenance: 40 mg every week beginning day 29.  Dose adjustments: Renal: no dose adjustments (has not been studied) Hepatic: no dose adjustments (has not been studied)  Drug-drug interactions: none identified   Screening: TB test: completed  Hepatitis: completed   Monitoring: S/sx of infection: none CBC: nl (11/24/2021) S/sx of hypersensitivity: none S/sx of malignancy: none S/sx of heart failure: none  Other side effects: none  O:  Lab Results  Component Value Date   WBC 7.9 11/24/2021   HGB 14.1 11/24/2021   HCT 43.0 11/24/2021   MCV 86 11/24/2021   PLT 322 11/24/2021      Chemistry      Component Value Date/Time   NA 141 11/24/2021 1632   K 4.2 11/24/2021 1632   CL 100 11/24/2021 1632   CO2 23 11/24/2021 1632   BUN 13 11/24/2021 1632   CREATININE 0.88 11/24/2021 1632   CREATININE 0.97 06/25/2016 0831      Component Value Date/Time   CALCIUM 9.3 11/24/2021 1632   ALKPHOS 74 11/24/2021 1632   AST 13 11/24/2021 1632   ALT 12 11/24/2021 1632   BILITOT 0.3 11/24/2021 1632     A/P: 1. Medication review: Patient is about to start Humira for HS. Reviewed the medication with the patient, including the following: Humira is a TNF blocking agent indicated for ankylosing spondylitis, Crohn's disease, Hidradenitis suppurativa, psoriatic arthritis, plaque psoriasis, ulcerative colitis, and uveitis. Patient educated on purpose, proper use and potential adverse effects of Humira. Possible adverse effects are increased risk of  infections, headache, and injection site reactions. There is the possibility of an increased risk of malignancy but it is not well understood if this increased risk is due to there medication or the disease state. There are rare cases of pancytopenia and aplastic anemia. For SubQ injection at separate sites in the thigh or lower abdomen (avoiding areas within 2 inches of navel); rotate injection sites. May leave at room temperature for ~15 to 30 minutes prior to use; do not remove cap or cover while allowing product to reach room temperature. Do not use if solution is discolored or contains particulate matter. Do not administer to skin which is red, tender, bruised, hard, or that has scars, stretch marks, or psoriasis plaques. Needle cap of the prefilled syringe or needle cover for the adalimumab pen may contain latex. Prefilled pens and syringes are available for use by patients and the full amount of the syringe should be injected (self-administration); the vial is intended for institutional use only. Vials do not contain a preservative; discard unused portion. No recommendations for any changes at this time.  Benard Halsted, PharmD, [facility], [place] [facility] [phone]

## 2022-01-05 ENCOUNTER — Other Ambulatory Visit (HOSPITAL_COMMUNITY): Payer: Self-pay

## 2022-01-06 ENCOUNTER — Other Ambulatory Visit (HOSPITAL_COMMUNITY): Payer: Self-pay

## 2022-01-06 ENCOUNTER — Other Ambulatory Visit: Payer: Self-pay | Admitting: Medical

## 2022-01-06 MED ORDER — FLUCONAZOLE 150 MG PO TABS
150.0000 mg | ORAL_TABLET | ORAL | 1 refills | Status: DC
  Filled 2022-01-06: qty 2, 3d supply, fill #0
  Filled 2022-02-27: qty 2, 3d supply, fill #1

## 2022-01-06 MED ORDER — DOXYCYCLINE HYCLATE 100 MG PO CAPS
100.0000 mg | ORAL_CAPSULE | Freq: Every day | ORAL | 1 refills | Status: DC
  Filled 2022-01-06: qty 30, 30d supply, fill #0
  Filled 2022-02-03: qty 30, 30d supply, fill #1

## 2022-01-06 MED ORDER — WEGOVY 2.4 MG/0.75ML ~~LOC~~ SOAJ
2.4000 mg | SUBCUTANEOUS | 2 refills | Status: DC
  Filled 2022-01-06: qty 3, 28d supply, fill #0
  Filled 2022-02-03: qty 3, 28d supply, fill #1
  Filled 2022-02-27: qty 3, 28d supply, fill #2

## 2022-01-21 ENCOUNTER — Other Ambulatory Visit (HOSPITAL_COMMUNITY): Payer: Self-pay

## 2022-01-23 ENCOUNTER — Other Ambulatory Visit (HOSPITAL_COMMUNITY): Payer: Self-pay

## 2022-01-27 ENCOUNTER — Other Ambulatory Visit (HOSPITAL_COMMUNITY): Payer: Self-pay

## 2022-01-28 ENCOUNTER — Other Ambulatory Visit (HOSPITAL_COMMUNITY): Payer: Self-pay

## 2022-02-03 ENCOUNTER — Other Ambulatory Visit (HOSPITAL_COMMUNITY): Payer: Self-pay

## 2022-02-19 ENCOUNTER — Other Ambulatory Visit (HOSPITAL_COMMUNITY): Payer: Self-pay

## 2022-02-25 ENCOUNTER — Other Ambulatory Visit: Payer: Self-pay

## 2022-02-26 ENCOUNTER — Other Ambulatory Visit (HOSPITAL_COMMUNITY): Payer: Self-pay

## 2022-03-04 ENCOUNTER — Telehealth (INDEPENDENT_AMBULATORY_CARE_PROVIDER_SITE_OTHER): Payer: 59 | Admitting: Medical

## 2022-03-04 ENCOUNTER — Encounter: Payer: Self-pay | Admitting: Medical

## 2022-03-04 ENCOUNTER — Other Ambulatory Visit (HOSPITAL_COMMUNITY): Payer: Self-pay

## 2022-03-04 VITALS — BP 120/78 | Temp 97.6°F | Wt 213.0 lb

## 2022-03-04 DIAGNOSIS — J019 Acute sinusitis, unspecified: Secondary | ICD-10-CM

## 2022-03-04 DIAGNOSIS — R051 Acute cough: Secondary | ICD-10-CM

## 2022-03-04 MED ORDER — ALBUTEROL SULFATE HFA 108 (90 BASE) MCG/ACT IN AERS
2.0000 | INHALATION_SPRAY | Freq: Four times a day (QID) | RESPIRATORY_TRACT | 0 refills | Status: DC | PRN
  Filled 2022-03-04: qty 6.7, 25d supply, fill #0

## 2022-03-04 MED ORDER — AMOXICILLIN 875 MG PO TABS
875.0000 mg | ORAL_TABLET | Freq: Two times a day (BID) | ORAL | 0 refills | Status: AC
  Filled 2022-03-04: qty 20, 10d supply, fill #0

## 2022-03-04 MED ORDER — HYDROCOD POLI-CHLORPHE POLI ER 10-8 MG/5ML PO SUER
5.0000 mL | Freq: Two times a day (BID) | ORAL | 0 refills | Status: DC | PRN
  Filled 2022-03-04: qty 115, 12d supply, fill #0

## 2022-03-04 NOTE — Progress Notes (Signed)
Subjective:     Patient ID: Laurie Reese, female   DOB: 18-Feb-1986, 37 y.o.   MRN: [id number]  This visit type was conducted due to national recommendations for restrictions regarding the COVID-19 Pandemic (e.g. social distancing) in an effort to limit this patient's exposure and mitigate transmission in our community.  Due to their co-morbid illnesses, this patient is at least at moderate risk for complications without adequate follow up.  This format is felt to be most appropriate for this patient at this time.    Documentation for virtual audio and video telecommunications through [place] encounter:  The patient was located at home. The provider was located in the office. The patient did consent to this visit and is aware of possible charges through their insurance for this visit.  The other persons participating in this telemedicine service were none. Time spent on call was 20 minutes and in review of previous records 20 minutes total.  This virtual service is not related to other E/M service within previous 7 days.   HPI Chief Complaint  Patient presents with   sinus infection    Sinus infection x started last Thursday. Negative for covid, flu and rsv. No fever, no body aches. Symptoms- cough, and chest congestion, fatigue, mucous- thick green with a tint of yellow.   Virtual consult for possible sinus infection. Started 6 days ago.   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  She notes coughing, hoarse voice, deep cough hurting chest, chest congestion, fatigue.   Getting colored mucous.  Currently using dayquil.   Using benadryl at night.  Using humidifier.   Having some cough fits at night.  Some SOB at times.  Last illness was on Doxycyline in early December 2023 for tooth infection.   No other aggravating or relieving factors. No other complaint.  Past Medical History:  Diagnosis Date   HPV vaccine counseling    Gardasil series  completed ...    HSV-1 infection    Maternal anemia, with delivery    Migraine    Postpartum thyroiditis 06/07/2020   Reactive airway disease    rare flare up   Current Outpatient Medications on File Prior to Visit  Medication Sig Dispense Refill   Adalimumab (HUMIRA PEN) 40 MG/0.8ML PNKT Starting day 29, inject 40mg  Subcutaneously every week 4 each 6   clindamycin (CLEOCIN T) 1 % SWAB Use twice a day. 69 each 2   levonorgestrel (MIRENA) 20 MCG/24HR IUD 1 each by Intrauterine route once.     Semaglutide-Weight Management (WEGOVY) 2.4 MG/0.75ML SOAJ Inject 2.4 mg into the skin once a week. 3 mL 2   Ubrogepant (UBRELVY) 100 MG TABS Take 1 tablet by mouth daily as needed. Can repeat in 2 hours if needed 30 tablet 0   Adalimumab (HUMIRA PEN) 40 MG/0.8ML PNKT Inject 160mg  Subcutaneously on day 1, then inject 80mg  on day 15 6 each 0   No current facility-administered medications on file prior to visit.    Review of Systems As in subjective    Objective:   Physical Exam Due to coronavirus pandemic stay at home measures, patient visit was virtual and they were not examined in person.    BP 120/78   Temp 97.6 F (36.4 C)   Wt 213 lb (96.6 kg)   BMI 33.86 kg/m   Gen: wd, wn, nad Hoarse voice, coughing quite a bit No labored breathing or wheezing      Assessment:  Encounter Diagnoses  Name Primary?   Acute non-recurrent sinusitis, unspecified location Yes   Acute cough        Plan:     Advised rest, good hydration, add salt water gargles and nasal saline flush, continue over-the-counter remedies such as Mucinex DM during the day, can use the Tussionex cough syrup as needed but caution with sedation.  Begin albuterol as needed, begin on amoxicillin.  If worse or not much improved within the next 3 to 4 days, recheck  Nemesis was seen today for sinus infection.  Diagnoses and all orders for this visit:  Acute non-recurrent sinusitis, unspecified location  Acute  cough  Other orders -     chlorpheniramine-HYDROcodone (TUSSIONEX) 10-8 MG/5ML; Take 5 mLs by mouth every 12 (twelve) hours as needed for cough. -     albuterol (VENTOLIN HFA) 108 (90 Base) MCG/ACT inhaler; Inhale 2 puffs into the lungs every 6 (six) hours as needed for wheezing or shortness of breath. -     amoxicillin (AMOXIL) 875 MG tablet; Take 1 tablet (875 mg total) by mouth 2 (two) times daily for 10 days.   F/u prn

## 2022-03-17 ENCOUNTER — Other Ambulatory Visit (HOSPITAL_COMMUNITY): Payer: Self-pay

## 2022-03-17 MED ORDER — HUMIRA (2 PEN) 40 MG/0.8ML ~~LOC~~ AJKT
AUTO-INJECTOR | SUBCUTANEOUS | 4 refills | Status: DC
  Filled 2022-03-23 (×2): qty 4, 28d supply, fill #0
  Filled 2022-04-27: qty 4, 28d supply, fill #1
  Filled 2022-05-26: qty 4, 28d supply, fill #2

## 2022-03-19 ENCOUNTER — Telehealth: Payer: 59 | Admitting: Physician Assistant

## 2022-03-19 ENCOUNTER — Other Ambulatory Visit (HOSPITAL_COMMUNITY): Payer: Self-pay

## 2022-03-19 DIAGNOSIS — B3731 Acute candidiasis of vulva and vagina: Secondary | ICD-10-CM | POA: Diagnosis not present

## 2022-03-19 MED ORDER — FLUCONAZOLE 150 MG PO TABS
150.0000 mg | ORAL_TABLET | ORAL | 0 refills | Status: DC | PRN
  Filled 2022-03-19: qty 2, 6d supply, fill #0

## 2022-03-19 NOTE — Progress Notes (Signed)

## 2022-03-20 ENCOUNTER — Other Ambulatory Visit (HOSPITAL_COMMUNITY): Payer: Self-pay

## 2022-03-23 ENCOUNTER — Other Ambulatory Visit: Payer: Self-pay

## 2022-03-24 ENCOUNTER — Telehealth: Payer: Self-pay | Admitting: Internal Medicine

## 2022-03-24 DIAGNOSIS — Z131 Encounter for screening for diabetes mellitus: Secondary | ICD-10-CM | POA: Diagnosis not present

## 2022-03-24 DIAGNOSIS — Z0000 Encounter for general adult medical examination without abnormal findings: Secondary | ICD-10-CM | POA: Diagnosis not present

## 2022-03-24 DIAGNOSIS — Z13220 Encounter for screening for lipoid disorders: Secondary | ICD-10-CM | POA: Diagnosis not present

## 2022-03-24 DIAGNOSIS — Z1329 Encounter for screening for other suspected endocrine disorder: Secondary | ICD-10-CM | POA: Diagnosis not present

## 2022-03-24 NOTE — Telephone Encounter (Signed)
Pt hs CPE on Monday but would like to get labs done with solastas at her work. Can you tell me what labs are needs and I can put it in so she can have them done at her work

## 2022-03-24 NOTE — Telephone Encounter (Signed)
Per Audelia Acton ok for CBC, CMET if not done by Dermatology for humira, TSH, Lipid, A1c

## 2022-03-25 ENCOUNTER — Other Ambulatory Visit (HOSPITAL_COMMUNITY): Payer: Self-pay

## 2022-03-26 LAB — CBC WITH DIFFERENTIAL/PLATELET
Absolute Monocytes: 593 cells/uL (ref 200–950)
Basophils Absolute: 47 cells/uL (ref 0–200)
Basophils Relative: 0.6 %
Eosinophils Absolute: 70 cells/uL (ref 15–500)
Eosinophils Relative: 0.9 %
HCT: 42.5 % (ref 35.0–45.0)
Hemoglobin: 14.8 g/dL (ref 11.7–15.5)
Lymphs Abs: 3947 cells/uL — ABNORMAL HIGH (ref 850–3900)
MCH: 30.1 pg (ref 27.0–33.0)
MCHC: 34.8 g/dL (ref 32.0–36.0)
MCV: 86.6 fL (ref 80.0–100.0)
MPV: 10.6 fL (ref 7.5–12.5)
Monocytes Relative: 7.6 %
Neutro Abs: 3143 cells/uL (ref 1500–7800)
Neutrophils Relative %: 40.3 %
Platelets: 345 10*3/uL (ref 140–400)
RBC: 4.91 10*6/uL (ref 3.80–5.10)
RDW: 13 % (ref 11.0–15.0)
Total Lymphocyte: 50.6 %
WBC: 7.8 10*3/uL (ref 3.8–10.8)

## 2022-03-26 LAB — LIPID PANEL
Cholesterol: 132 mg/dL (ref <200)
HDL: 37 mg/dL — ABNORMAL LOW (ref >=50)
LDL Cholesterol (Calc): 76 mg/dL (calc)
Non-HDL Cholesterol (Calc): 95 mg/dL (calc) (ref <130)
Total CHOL/HDL Ratio: 3.6 (calc) (ref <5.0)
Triglycerides: 105 mg/dL (ref <150)

## 2022-03-26 LAB — COMPREHENSIVE METABOLIC PANEL
AG Ratio: 1.6 (calc) (ref 1.0–2.5)
ALT: 10 U/L (ref 6–29)
AST: 10 U/L (ref 10–30)
Albumin: 4.5 g/dL (ref 3.6–5.1)
Alkaline phosphatase (APISO): 58 U/L (ref 31–125)
BUN: 10 mg/dL (ref 7–25)
CO2: 26 mmol/L (ref 20–32)
Calcium: 9.3 mg/dL (ref 8.6–10.2)
Chloride: 104 mmol/L (ref 98–110)
Creat: 0.77 mg/dL (ref 0.50–0.97)
Globulin: 2.9 g/dL (calc) (ref 1.9–3.7)
Glucose, Bld: 79 mg/dL (ref 65–99)
Potassium: 4.3 mmol/L (ref 3.5–5.3)
Sodium: 138 mmol/L (ref 135–146)
Total Bilirubin: 0.5 mg/dL (ref 0.2–1.2)
Total Protein: 7.4 g/dL (ref 6.1–8.1)

## 2022-03-26 LAB — SPECIMEN COMPROMISED

## 2022-03-26 LAB — TSH: TSH: 1.04 mIU/L

## 2022-03-26 LAB — HEMOGLOBIN A1C
Hgb A1c MFr Bld: 5.5 % of total Hgb (ref <5.7)
Mean Plasma Glucose: 111 mg/dL
eAG (mmol/L): 6.2 mmol/L

## 2022-03-26 NOTE — Progress Notes (Signed)
Results sent through MyChart

## 2022-03-27 ENCOUNTER — Other Ambulatory Visit (HOSPITAL_COMMUNITY): Payer: Self-pay

## 2022-03-30 ENCOUNTER — Encounter: Payer: Self-pay | Admitting: Medical

## 2022-03-30 ENCOUNTER — Ambulatory Visit (INDEPENDENT_AMBULATORY_CARE_PROVIDER_SITE_OTHER): Payer: 59 | Admitting: Medical

## 2022-03-30 ENCOUNTER — Other Ambulatory Visit (HOSPITAL_COMMUNITY): Payer: Self-pay

## 2022-03-30 VITALS — BP 110/70 | HR 98 | Ht 67.0 in | Wt 211.4 lb

## 2022-03-30 DIAGNOSIS — Z0000 Encounter for general adult medical examination without abnormal findings: Secondary | ICD-10-CM

## 2022-03-30 DIAGNOSIS — G43009 Migraine without aura, not intractable, without status migrainosus: Secondary | ICD-10-CM | POA: Diagnosis not present

## 2022-03-30 DIAGNOSIS — Z131 Encounter for screening for diabetes mellitus: Secondary | ICD-10-CM

## 2022-03-30 DIAGNOSIS — Z975 Presence of (intrauterine) contraceptive device: Secondary | ICD-10-CM

## 2022-03-30 DIAGNOSIS — Z7185 Encounter for immunization safety counseling: Secondary | ICD-10-CM

## 2022-03-30 DIAGNOSIS — Z6833 Body mass index (BMI) 33.0-33.9, adult: Secondary | ICD-10-CM | POA: Diagnosis not present

## 2022-03-30 DIAGNOSIS — Z13220 Encounter for screening for lipoid disorders: Secondary | ICD-10-CM | POA: Diagnosis not present

## 2022-03-30 MED ORDER — ZEPBOUND 7.5 MG/0.5ML ~~LOC~~ SOAJ
7.5000 mg | SUBCUTANEOUS | 0 refills | Status: DC
  Filled 2022-03-30: qty 2, 28d supply, fill #0

## 2022-03-30 NOTE — Progress Notes (Signed)
Subjective:   HPI  Laurie Reese is a 37 y.o. female who presents for Chief Complaint  Patient presents with   physical    Physical had labs last weeks. No concerns-     Medical Team Dr. Carlus Pavlov, endocrinology Dr. Olivia Mackie, OB/Gyn Dr. Janalyn Harder, dermatology prior Dr. Jimmey Ralph, optometry Dr. Kyra Leyland, dentist Delane Wessinger, Kermit Balo, PA-C here for primary care   Concerns: Stable on wegovy but plateau.   Not seeing additional weight loss. Is active, exercising, eating healthy and using portion control but plateau.      Sees gynecology for routine in March. Has IUD in place.  Reviewed their medical, surgical, family, social, medication, and allergy history and updated chart as appropriate.  Past Medical History:  Diagnosis Date   HPV vaccine counseling    Gardasil series completed ...    HSV-1 infection    Maternal anemia, with delivery    Migraine    Postpartum thyroiditis 06/07/2020   Reactive airway disease    rare flare up    Family History  Problem Relation Age of Onset   Hypertension Mother    Diabetes Mother    Diabetes Father    Crohn's disease Sister    Cancer Maternal Aunt        breast   Heart disease Maternal Grandmother    Cancer Maternal Grandfather        prostate   Diabetes Maternal Grandfather    Hypertension Maternal Grandfather    Heart disease Maternal Grandfather    Prostate cancer Maternal Grandfather    Colon cancer Neg Hx      Current Outpatient Medications:    Adalimumab (HUMIRA PEN) 40 MG/0.8ML PNKT, Inject 160mg  Subcutaneously on day 1, then inject 80mg  on day 15, Disp: 6 each, Rfl: 0   Adalimumab (HUMIRA, 2 PEN,) 40 MG/0.8ML PNKT, Starting day 29, inject 40mg  Subcutaneously every week, Disp: 4 each, Rfl: 4   albuterol (VENTOLIN HFA) 108 (90 Base) MCG/ACT inhaler, Inhale 2 puffs into the lungs every 6 (six) hours as needed for wheezing or shortness of breath., Disp: 8 g, Rfl: 0   clindamycin (CLEOCIN T) 1 % SWAB,  Use twice a day., Disp: 69 each, Rfl: 2   levonorgestrel (MIRENA) 20 MCG/24HR IUD, 1 each by Intrauterine route once., Disp: , Rfl:    Semaglutide-Weight Management (WEGOVY) 2.4 MG/0.75ML SOAJ, Inject 2.4 mg into the skin once a week., Disp: 3 mL, Rfl: 2   tirzepatide (ZEPBOUND) 7.5 MG/0.5ML Pen, Inject 7.5 mg into the skin once a week., Disp: 2 mL, Rfl: 0   Ubrogepant (UBRELVY) 100 MG TABS, Take 1 tablet by mouth daily as needed. Can repeat in 2 hours if needed, Disp: 30 tablet, Rfl: 0  Allergies  Allergen Reactions   Celexa [Citalopram] Swelling    Swelling on face   Ciprodex [Ciprofloxacin-Dexamethasone] Swelling    Swelling on face      Review of Systems  Constitutional:  Negative for chills, fever, malaise/fatigue and weight loss.  HENT:  Negative for congestion, ear pain, hearing loss, sore throat and tinnitus.   Eyes:  Negative for blurred vision, pain and redness.  Respiratory:  Negative for cough, hemoptysis and shortness of breath.   Cardiovascular:  Negative for chest pain, palpitations, orthopnea, claudication and leg swelling.  Gastrointestinal:  Negative for abdominal pain, blood in stool, constipation, diarrhea, nausea and vomiting.  Genitourinary:  Negative for dysuria, flank pain, frequency, hematuria and urgency.  Musculoskeletal:  Negative for falls, joint pain  and myalgias.  Skin:  Negative for itching and rash.  Neurological:  Negative for dizziness, tingling, speech change, weakness and headaches.  Endo/Heme/Allergies:  Negative for polydipsia. Does not bruise/bleed easily.  Psychiatric/Behavioral:  Negative for depression and memory loss. The patient is not nervous/anxious and does not have insomnia.          03/30/2022    2:59 PM 03/04/2022    8:20 AM 11/19/2021   10:14 AM 09/19/2021    1:33 PM 07/29/2021   11:28 AM  Depression screen PHQ 2/9  Decreased Interest 0 0 0 0 0  Down, Depressed, Hopeless 0 0 0 0 0  PHQ - 2 Score 0 0 0 0 0       Objective:   BP 110/70   Pulse 98   Ht 5\' 7"  (1.702 m)   Wt 211 lb 6.4 oz (95.9 kg)   LMP 03/28/2022   BMI 33.11 kg/m   BP Readings from Last 3 Encounters:  03/30/22 110/70  03/04/22 120/78  11/19/21 110/70   Wt Readings from Last 3 Encounters:  03/30/22 211 lb 6.4 oz (95.9 kg)  03/04/22 213 lb (96.6 kg)  11/19/21 213 lb (96.6 kg)    General appearance: alert, no distress, WD/WN, Caucasian female Skin: scattered macules, no worrisome lesions, tattoos bilat volar wrist and cross tattoo on back of neck HEENT: normocephalic, conjunctiva/corneas normal, sclerae anicteric, PERRLA, EOMi Neck: supple, no lymphadenopathy, no thyromegaly, no masses, normal ROM, no bruits Chest: non tender, normal shape and expansion Heart: RRR, normal S1, S2, no murmurs Lungs: CTA bilaterally, no wheezes, rhonchi, or rales Abdomen: +bs, soft, non tender, non distended, no masses, no hepatomegaly, no splenomegaly, no bruits Back: non tender, normal ROM, no scoliosis Musculoskeletal: upper extremities non tender, no obvious deformity, normal ROM throughout, lower extremities non tender, no obvious deformity, normal ROM throughout Extremities: no edema, no cyanosis, no clubbing Pulses: 2+ symmetric, upper and lower extremities, normal cap refill Neurological: alert, oriented x 3, CN2-12 intact, strength normal upper extremities and lower extremities, sensation normal throughout, DTRs 2+ throughout, no cerebellar signs, gait normal Psychiatric: normal affect, behavior normal, pleasant  Breast/gyn/rectal - deferred to gynecology     Assessment and Plan :   Encounter Diagnoses  Name Primary?   Encounter for health maintenance examination in adult Yes   Vaccine counseling    Screening for lipid disorders    Screening for diabetes mellitus    IUD (intrauterine device) in place    Migraine without aura and without status migrainosus, not intractable    BMI 33.0-33.9,adult      This visit was a preventative care  visit, also known as wellness visit or routine physical.   Topics typically include healthy lifestyle, diet, exercise, preventative care, vaccinations, sick and well care, proper use of emergency dept and after hours care, as well as other concerns.     Recommendations: Continue to return yearly for your annual wellness and preventative care visits.  This gives [place] a chance to discuss healthy lifestyle, exercise, vaccinations, review your chart record, and perform screenings where appropriate.  I recommend you see your eye doctor yearly for routine vision care.  I recommend you see your dentist yearly for routine dental care including hygiene visits twice yearly.  See your gynecologist yearly for routine gynecological care.    Vaccination recommendations were reviewed Immunization History  Administered Date(s) Administered   HIB (PRP-OMP) 07/06/1989   Hepatitis B, adult 10/26/1999, 12/31/1999, 06/15/2000   Influenza Nasal 11/16/2013  Influenza Split 11/21/2010, 11/01/2012, 01/03/2014   Influenza-Unspecified 11/30/2020   MMR 07/06/1989, 09/09/1993   PFIZER(Purple Top)SARS-COV-2 Vaccination 07/15/2019, 08/11/2019, 02/16/2020   PPD Test 05/30/2008   Tdap 05/29/2008, 07/13/2018, 06/27/2019   Varicella 07/26/2008    Screening for cancer: Colon cancer screening: Age 34  Breast cancer screening: You should perform a self breast exam monthly.   We reviewed recommendations for regular mammograms and breast cancer screening.  Cervical cancer screening: We reviewed recommendations for pap smear screening.     Skin cancer screening: Check your skin regularly for new changes, growing lesions, or other lesions of concern Come in for evaluation if you have skin lesions of concern.  Lung cancer screening: If you have a greater than 20 pack year history of tobacco use, then you may qualify for lung cancer screening with a chest CT scan.   Please call your insurance company to inquire  about coverage for this test.  We currently don't have screenings for other cancers besides breast, cervical, colon, and lung cancers.  If you have a strong family history of cancer or have other cancer screening concerns, please let me know.    Bone health: Get at least 150 minutes of aerobic exercise weekly Get weight bearing exercise at least once weekly Bone density test:  A bone density test is an imaging test that uses a type of X-ray to measure the amount of calcium and other minerals in your bones. The test may be used to diagnose or screen you for a condition that causes weak or thin bones (osteoporosis), predict your risk for a broken bone (fracture), or determine how well your osteoporosis treatment is working. The bone density test is recommended for females 45 and older, or females or males <84 if certain risk factors such as thyroid disease, long term use of steroids such as for asthma or rheumatological issues, vitamin D deficiency, estrogen deficiency, family history of osteoporosis, self or family history of fragility fracture in first degree relative.    Heart health: Get at least 150 minutes of aerobic exercise weekly Limit alcohol It is important to maintain a healthy blood pressure and healthy cholesterol numbers  Heart disease screening: Screening for heart disease includes screening for blood pressure, fasting lipids, glucose/diabetes screening, BMI height to weight ratio, reviewed of smoking status, physical activity, and diet.    Goals include blood pressure 120/80 or less, maintaining a healthy lipid/cholesterol profile, preventing diabetes or keeping diabetes numbers under good control, not smoking or using tobacco products, exercising most days per week or at least 150 minutes per week of exercise, and eating healthy variety of fruits and vegetables, healthy oils, and avoiding unhealthy food choices like fried food, fast food, high sugar and high cholesterol foods.     Other tests may possibly include EKG test, CT coronary calcium score, echocardiogram, exercise treadmill stress test.     Medical care options: I recommend you continue to seek care here first for routine care.  We try really hard to have available appointments Monday through Friday daytime hours for sick visits, acute visits, and physicals.  Urgent care should be used for after hours and weekends for significant issues that cannot wait till the next day.  The emergency department should be used for significant potentially life-threatening emergencies.  The emergency department is expensive, can often have long wait times for less significant concerns, so try to utilize primary care, urgent care, or telemedicine when possible to avoid unnecessary trips to the emergency department.  Virtual visits and telemedicine have been introduced since the pandemic started in 2020, and can be convenient ways to receive medical care.  We offer virtual appointments as well to assist you in a variety of options to seek medical care.    Separate significant issues discussed: Low HDL - discussed vigorous exercise and diet.  BMI > 30 - counseled on diet , exercise, consider low carb diet, change from [facility] to trial of Zepbound if covered by insurance.  Discussed need for back up birth control for the next month anything we add or change dosing of this medication. Work on getting moderate intensity exercise several days per week, add weight bearing exercise, and cut out excess carbs.  Reactive airway - no recent issues but in the past did well with Advair and albuterol with acute flareup  Migraines - no recent issues.      Bera was seen today for physical.  Diagnoses and all orders for this visit:  Encounter for health maintenance examination in adult  Vaccine counseling  Screening for lipid disorders  Screening for diabetes mellitus  IUD (intrauterine device) in place  Migraine without aura  and without status migrainosus, not intractable  BMI 33.0-33.9,adult  Other orders -     tirzepatide (ZEPBOUND) 7.5 MG/0.5ML Pen; Inject 7.5 mg into the skin once a week.     Follow-up pending labs, yearly for physical

## 2022-03-31 ENCOUNTER — Other Ambulatory Visit (HOSPITAL_COMMUNITY): Payer: Self-pay

## 2022-03-31 DIAGNOSIS — L732 Hidradenitis suppurativa: Secondary | ICD-10-CM | POA: Diagnosis not present

## 2022-03-31 DIAGNOSIS — Z79899 Other long term (current) drug therapy: Secondary | ICD-10-CM | POA: Diagnosis not present

## 2022-03-31 DIAGNOSIS — L905 Scar conditions and fibrosis of skin: Secondary | ICD-10-CM | POA: Diagnosis not present

## 2022-03-31 MED ORDER — HUMIRA (2 PEN) 40 MG/0.8ML ~~LOC~~ AJKT: AUTO-INJECTOR | SUBCUTANEOUS | 6 refills | Status: DC

## 2022-04-02 ENCOUNTER — Other Ambulatory Visit (HOSPITAL_COMMUNITY): Payer: Self-pay

## 2022-04-03 ENCOUNTER — Other Ambulatory Visit (HOSPITAL_COMMUNITY): Payer: Self-pay

## 2022-04-03 NOTE — Telephone Encounter (Signed)
I have completed PA for Zepbound through covermymeds. Waiting on response

## 2022-04-06 ENCOUNTER — Other Ambulatory Visit (HOSPITAL_COMMUNITY): Payer: Self-pay

## 2022-04-08 ENCOUNTER — Other Ambulatory Visit (HOSPITAL_COMMUNITY): Payer: Self-pay

## 2022-04-09 NOTE — Telephone Encounter (Signed)
Can you print out the P.A. that you completed, it's not scanned in yet & I can't access it in Cover my meds

## 2022-04-09 NOTE — Telephone Encounter (Signed)
Med has been denied.   Pt would like this appealed.  Excerise 3-5 times a week, portion controls, doing low carbs/ no sodas and drinking 60-80 ozs of water a day.   Mickel Baas, can you please complete an appeal on this

## 2022-04-17 NOTE — Telephone Encounter (Signed)
Pt called and wants to know the status of the appeal. Please advise

## 2022-04-21 ENCOUNTER — Encounter: Payer: Self-pay | Admitting: Medical

## 2022-04-21 ENCOUNTER — Telehealth: Payer: Self-pay | Admitting: Medical

## 2022-04-21 NOTE — Telephone Encounter (Signed)
I have notified pt

## 2022-04-21 NOTE — Telephone Encounter (Signed)
I called [employer] regarding the denial for Zepbound, it was denied stating there was no evidence of exercise or weight loss modification program, which was evidentially not checked in the original P.A.  I tried to correct this over the phone but they would not allow me to verbally so I had to do a written appeal.  Letter was typed and form completed & last office visit printed which showed the evidence required was all faxed to [place] fax# [phone]

## 2022-04-21 NOTE — Telephone Encounter (Signed)
Appeal was done & I sent pt mychart message

## 2022-04-23 ENCOUNTER — Other Ambulatory Visit: Payer: Self-pay | Admitting: Medical

## 2022-04-23 ENCOUNTER — Other Ambulatory Visit (HOSPITAL_COMMUNITY): Payer: Self-pay

## 2022-04-23 MED ORDER — WEGOVY 2.4 MG/0.75ML ~~LOC~~ SOAJ
2.4000 mg | SUBCUTANEOUS | 1 refills | Status: DC
  Filled 2022-04-23: qty 3, 28d supply, fill #0
  Filled 2022-05-21: qty 3, 28d supply, fill #1

## 2022-04-23 NOTE — Telephone Encounter (Signed)
Waiting on PA to be done through [employer]eds

## 2022-04-27 ENCOUNTER — Other Ambulatory Visit (HOSPITAL_COMMUNITY): Payer: Self-pay

## 2022-04-28 ENCOUNTER — Other Ambulatory Visit (HOSPITAL_COMMUNITY): Payer: Self-pay

## 2022-04-30 ENCOUNTER — Other Ambulatory Visit (HOSPITAL_COMMUNITY): Payer: Self-pay

## 2022-05-20 DIAGNOSIS — R87810 Cervical high risk human papillomavirus (HPV) DNA test positive: Secondary | ICD-10-CM | POA: Diagnosis not present

## 2022-05-20 DIAGNOSIS — Z124 Encounter for screening for malignant neoplasm of cervix: Secondary | ICD-10-CM | POA: Diagnosis not present

## 2022-05-20 DIAGNOSIS — Z01411 Encounter for gynecological examination (general) (routine) with abnormal findings: Secondary | ICD-10-CM | POA: Diagnosis not present

## 2022-05-20 DIAGNOSIS — Z01419 Encounter for gynecological examination (general) (routine) without abnormal findings: Secondary | ICD-10-CM | POA: Diagnosis not present

## 2022-05-21 ENCOUNTER — Other Ambulatory Visit: Payer: Self-pay

## 2022-05-24 ENCOUNTER — Telehealth: Payer: Self-pay | Admitting: Medical

## 2022-05-24 NOTE — Telephone Encounter (Unsigned)
P.A. UBRELVY RENEWAL 

## 2022-05-25 LAB — HM PAP SMEAR
HPV 16/18/45 genotyping: NEGATIVE
HPV, high-risk: POSITIVE

## 2022-05-25 NOTE — Telephone Encounter (Signed)
Approved 16 tabs per 30 days til 05/25/23, sent mychart message

## 2022-05-25 NOTE — Telephone Encounter (Signed)
Per [facility]'s new policy no weight loss meds covered, appeal was denied

## 2022-05-26 ENCOUNTER — Other Ambulatory Visit (HOSPITAL_COMMUNITY): Payer: Self-pay

## 2022-05-28 ENCOUNTER — Other Ambulatory Visit (HOSPITAL_COMMUNITY): Payer: Self-pay

## 2022-06-02 ENCOUNTER — Other Ambulatory Visit: Payer: Self-pay

## 2022-06-18 ENCOUNTER — Other Ambulatory Visit (HOSPITAL_COMMUNITY): Payer: Self-pay

## 2022-06-25 ENCOUNTER — Other Ambulatory Visit (HOSPITAL_COMMUNITY): Payer: Self-pay

## 2022-06-25 ENCOUNTER — Other Ambulatory Visit: Payer: Self-pay | Admitting: Pharmacist

## 2022-06-25 MED ORDER — HUMIRA (2 PEN) 40 MG/0.8ML ~~LOC~~ AJKT
40.0000 mg | AUTO-INJECTOR | SUBCUTANEOUS | 6 refills | Status: DC
  Filled 2022-06-25: qty 4, fill #0
  Filled 2022-06-30: qty 4, 28d supply, fill #0
  Filled 2022-08-07: qty 4, 28d supply, fill #1
  Filled 2022-08-31: qty 4, 28d supply, fill #2

## 2022-06-30 ENCOUNTER — Other Ambulatory Visit: Payer: Self-pay

## 2022-06-30 ENCOUNTER — Other Ambulatory Visit (HOSPITAL_COMMUNITY): Payer: Self-pay

## 2022-07-07 ENCOUNTER — Other Ambulatory Visit: Payer: Self-pay

## 2022-07-08 ENCOUNTER — Telehealth: Payer: 59 | Admitting: Physician Assistant

## 2022-07-08 DIAGNOSIS — T3695XA Adverse effect of unspecified systemic antibiotic, initial encounter: Secondary | ICD-10-CM

## 2022-07-08 DIAGNOSIS — B379 Candidiasis, unspecified: Secondary | ICD-10-CM | POA: Diagnosis not present

## 2022-07-09 ENCOUNTER — Other Ambulatory Visit (HOSPITAL_COMMUNITY): Payer: Self-pay

## 2022-07-09 MED ORDER — FLUCONAZOLE 150 MG PO TABS
ORAL_TABLET | ORAL | 0 refills | Status: DC
  Filled 2022-07-09: qty 2, 3d supply, fill #0

## 2022-07-09 NOTE — Progress Notes (Signed)

## 2022-07-09 NOTE — Progress Notes (Signed)
I have spent 5 minutes in review of e-visit questionnaire, review and updating patient chart, medical decision making and response to patient.   Bane Hagy Cody Charm Stenner, PA-C    

## 2022-07-14 DIAGNOSIS — Z30432 Encounter for removal of intrauterine contraceptive device: Secondary | ICD-10-CM | POA: Diagnosis not present

## 2022-08-04 ENCOUNTER — Other Ambulatory Visit (HOSPITAL_COMMUNITY): Payer: Self-pay

## 2022-08-07 ENCOUNTER — Other Ambulatory Visit (HOSPITAL_COMMUNITY): Payer: Self-pay

## 2022-08-28 ENCOUNTER — Other Ambulatory Visit: Payer: Self-pay

## 2022-08-31 ENCOUNTER — Other Ambulatory Visit: Payer: Self-pay

## 2022-08-31 ENCOUNTER — Other Ambulatory Visit: Payer: Self-pay | Admitting: Medical

## 2022-08-31 ENCOUNTER — Other Ambulatory Visit (HOSPITAL_COMMUNITY): Payer: Self-pay

## 2022-08-31 ENCOUNTER — Telehealth: Payer: Self-pay | Admitting: Internal Medicine

## 2022-08-31 MED ORDER — NEOMYCIN-POLYMYXIN-HC 3.5-10000-1 OT SOLN
3.0000 [drp] | Freq: Three times a day (TID) | OTIC | 0 refills | Status: AC
  Filled 2022-08-31: qty 10, 10d supply, fill #0

## 2022-08-31 NOTE — Telephone Encounter (Signed)
Cortisporin drops 

## 2022-08-31 NOTE — Telephone Encounter (Signed)
Pt called and states that she had a provider at her office look in her ears and said she has an ear infection. She wants to know if you will call out Ciprofloxacin and Dexamethasone Otic to [place] for her

## 2022-09-08 ENCOUNTER — Other Ambulatory Visit (HOSPITAL_COMMUNITY): Payer: Self-pay

## 2022-09-14 ENCOUNTER — Other Ambulatory Visit (HOSPITAL_COMMUNITY): Payer: Self-pay

## 2022-09-14 MED ORDER — AMOXICILLIN 500 MG PO CAPS
500.0000 mg | ORAL_CAPSULE | Freq: Three times a day (TID) | ORAL | 0 refills | Status: AC
  Filled 2022-09-14: qty 21, 7d supply, fill #0

## 2022-09-29 ENCOUNTER — Other Ambulatory Visit (HOSPITAL_COMMUNITY): Payer: Self-pay

## 2022-09-29 DIAGNOSIS — D225 Melanocytic nevi of trunk: Secondary | ICD-10-CM | POA: Diagnosis not present

## 2022-09-29 DIAGNOSIS — L732 Hidradenitis suppurativa: Secondary | ICD-10-CM | POA: Diagnosis not present

## 2022-09-29 DIAGNOSIS — Z79899 Other long term (current) drug therapy: Secondary | ICD-10-CM | POA: Diagnosis not present

## 2022-09-29 DIAGNOSIS — L821 Other seborrheic keratosis: Secondary | ICD-10-CM | POA: Diagnosis not present

## 2022-09-29 DIAGNOSIS — L905 Scar conditions and fibrosis of skin: Secondary | ICD-10-CM | POA: Diagnosis not present

## 2022-09-29 DIAGNOSIS — L814 Other melanin hyperpigmentation: Secondary | ICD-10-CM | POA: Diagnosis not present

## 2022-09-29 MED ORDER — CLINDAMYCIN PHOSPHATE 1 % EX LOTN
TOPICAL_LOTION | CUTANEOUS | 2 refills | Status: DC
  Filled 2022-09-29: qty 60, 30d supply, fill #0

## 2022-09-30 ENCOUNTER — Other Ambulatory Visit (HOSPITAL_COMMUNITY): Payer: Self-pay

## 2022-10-12 ENCOUNTER — Telehealth: Payer: 59 | Admitting: Medical

## 2022-10-12 ENCOUNTER — Other Ambulatory Visit (HOSPITAL_COMMUNITY): Payer: Self-pay

## 2022-10-12 DIAGNOSIS — R509 Fever, unspecified: Secondary | ICD-10-CM | POA: Diagnosis not present

## 2022-10-12 DIAGNOSIS — J111 Influenza due to unidentified influenza virus with other respiratory manifestations: Secondary | ICD-10-CM | POA: Diagnosis not present

## 2022-10-12 DIAGNOSIS — J029 Acute pharyngitis, unspecified: Secondary | ICD-10-CM

## 2022-10-12 MED ORDER — AMOXICILLIN 500 MG PO CAPS: 500.0000 mg | ORAL_CAPSULE | Freq: Three times a day (TID) | ORAL | 0 refills | Status: DC

## 2022-10-12 MED ORDER — XOFLUZA (40 MG DOSE) 1 X 40 MG PO TBPK
40.0000 mg | ORAL_TABLET | Freq: Once | ORAL | 0 refills | Status: AC
  Filled 2022-10-12: qty 1, 1d supply, fill #0

## 2022-10-12 MED ORDER — ALBUTEROL SULFATE HFA 108 (90 BASE) MCG/ACT IN AERS: 2.0000 | INHALATION_SPRAY | Freq: Four times a day (QID) | RESPIRATORY_TRACT | 1 refills | Status: DC | PRN

## 2022-10-12 MED ORDER — HYDROCODONE BIT-HOMATROP MBR 5-1.5 MG/5ML PO SOLN
5.0000 mL | Freq: Three times a day (TID) | ORAL | 0 refills | Status: AC | PRN
  Filled 2022-10-12: qty 75, 5d supply, fill #0

## 2022-10-12 MED ORDER — AMOXICILLIN 500 MG PO CAPS
500.0000 mg | ORAL_CAPSULE | Freq: Three times a day (TID) | ORAL | 0 refills | Status: AC
Start: 2022-10-12 — End: 2022-10-22
  Filled 2022-10-12: qty 30, 10d supply, fill #0

## 2022-10-12 MED ORDER — ALBUTEROL SULFATE HFA 108 (90 BASE) MCG/ACT IN AERS
2.0000 | INHALATION_SPRAY | Freq: Four times a day (QID) | RESPIRATORY_TRACT | 1 refills | Status: DC | PRN
Start: 2022-10-12 — End: 2023-03-26
  Filled 2022-10-12: qty 6.7, 25d supply, fill #0

## 2022-10-12 NOTE — Progress Notes (Signed)
Subjective:     Patient ID: Laurie Reese, female   DOB: 29-Dec-1985, 37 y.o.   MRN: [id number]  This visit type was conducted due to national recommendations for restrictions regarding the COVID-19 Pandemic (e.g. social distancing) in an effort to limit this patient's exposure and mitigate transmission in our community.  Due to their co-morbid illnesses, this patient is at least at moderate risk for complications without adequate follow up.  This format is felt to be most appropriate for this patient at this time.    Documentation for virtual audio and video telecommunications through [place] encounter:  The patient was located at home. The provider was located in the office. The patient did consent to this visit and is aware of possible charges through their insurance for this visit.  The other persons participating in this telemedicine service were none. Time spent on call was 20 minutes and in review of previous records 20 minutes total.  This virtual service is not related to other E/M service within previous 7 days.   HPI Chief Complaint  Patient presents with   Influenza    Pt has flu A and possible Strep, throat is red with pus, strep was negative but NP at her work looked at her and said she probably has strep even though it was negative.    Virtual for illness.  Started 2 days ago with fever, body aches, chills.  Daughter had flu a week ago and RSV.  She notes cough few days prior to 2 days ago, but yesterday felt horrible, fatigued, feverish.  Has hoarse voice today.  Today throat hurts bad, looked with light and throat is bright red with pus.    Works in Radio broadcast assistant.  Tested for flu, covid, rsv and strep.  Was + flu A, but negative for the other issues.    Using albuterol Sunday she has little wheezing.  Of note she has not done well attended to in the past but is willing to try different flu remedy  Past Medical History:  Diagnosis Date   HPV vaccine counseling     Gardasil series completed ...    HSV-1 infection    Maternal anemia, with delivery    Migraine    Postpartum thyroiditis 06/07/2020   Reactive airway disease    rare flare up   Current Outpatient Medications on File Prior to Visit  Medication Sig Dispense Refill   adalimumab (HUMIRA, 2 PEN,) 40 MG/0.8ML PNKT pen Inject 40 mg into the skin once a week. 4 each 6   clindamycin (CLEOCIN T) 1 % lotion Apply to affected area daily 60 mL 2   clindamycin (CLEOCIN T) 1 % SWAB Use twice a day. 69 each 2   fluconazole (DIFLUCAN) 150 MG tablet Take 1 tablet by mouth once. Repeat in 3 days if needed. 2 tablet 0   levonorgestrel (MIRENA) 20 MCG/24HR IUD 1 each by Intrauterine route once.     Semaglutide-Weight Management (WEGOVY) 2.4 MG/0.75ML SOAJ Inject 2.4 mg into the skin once a week. 3 mL 1   tirzepatide (ZEPBOUND) 7.5 MG/0.5ML Pen Inject 7.5 mg into the skin once a week. 2 mL 0   Ubrogepant (UBRELVY) 100 MG TABS Take 1 tablet by mouth daily as needed. Can repeat in 2 hours if needed 30 tablet 0   No current facility-administered medications on file prior to visit.     Review of Systems As in subjective    Objective:   Physical Exam Due to coronavirus pandemic  stay at home measures, patient visit was virtual and they were not examined in person.   There were no vitals taken for this visit.  Gen: Well-developed, well-nourished, no acute distress Somewhat ill-appearing, hoarse voice No labored breathing or wheezing     Assessment:     Encounter Diagnoses  Name Primary?   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  However her sore throat has significantly worsened in the last 24 hours with redness and swelling.  She has not done well with Tamiflu in the past.  We discussed treatment including rest, hydration, salt water gargles,  warm fluids, sore throat spray over-the-counter for comfort, she will continue alternating ibuprofen and Tylenol.  However she can begin Hycodan cough syrup as needed for worse cough.  Caution with sedation and medicine overlap.  She will use a trial of Xofluza x 1 dose for flu symptoms.  We discussed either doing a throat culture or a watch and wait approach versus beginning amoxicillin for possible concomitant strep.  Refilled albuterol inhaler as needed use  If much worse or no improvement in next few days then call or recheck   Lysa was seen today for influenza.  Diagnoses and all orders for this visit:  Flu  Sore throat  Fever, unspecified fever cause  Other orders -     Discontinue: albuterol (VENTOLIN HFA) 108 (90 Base) MCG/ACT inhaler; Inhale 2 puffs into the lungs every 6 (six) hours as needed for wheezing or shortness of breath. -     Discontinue: amoxicillin (AMOXIL) 500 MG capsule; Take 1 capsule (500 mg total) by mouth 3 (three) times daily for 10 days. -     Baloxavir Marboxil,40 MG Dose, (XOFLUZA, 40 MG DOSE,) 1 x 40 MG TBPK; Take 40 mg by mouth once for 1 dose. -     amoxicillin (AMOXIL) 500 MG capsule; Take 1 capsule (500 mg total) by mouth 3 (three) times daily for 10 days. -     albuterol (VENTOLIN HFA) 108 (90 Base) MCG/ACT inhaler; Inhale 2 puffs into the lungs every 6 (six) hours as needed for wheezing or shortness of breath. -     HYDROcodone bit-homatropine (HYCODAN) 5-1.5 MG/5ML syrup; Take 5 mLs by mouth every 8 (eight) hours as needed for up to 5 days for cough.  F/u prn

## 2022-10-13 ENCOUNTER — Other Ambulatory Visit (HOSPITAL_COMMUNITY): Payer: Self-pay

## 2022-10-13 MED ORDER — AMOXICILLIN 500 MG PO CAPS
500.0000 mg | ORAL_CAPSULE | Freq: Three times a day (TID) | ORAL | 0 refills | Status: DC
  Filled 2022-10-13: qty 30, 10d supply, fill #0

## 2022-10-13 MED ORDER — ALBUTEROL SULFATE HFA 108 (90 BASE) MCG/ACT IN AERS
2.0000 | INHALATION_SPRAY | Freq: Four times a day (QID) | RESPIRATORY_TRACT | 1 refills | Status: DC | PRN
  Filled 2022-10-13: qty 6.7, 25d supply, fill #0

## 2022-10-26 DIAGNOSIS — Z3689 Encounter for other specified antenatal screening: Secondary | ICD-10-CM | POA: Diagnosis not present

## 2022-10-26 DIAGNOSIS — Z3200 Encounter for pregnancy test, result unknown: Secondary | ICD-10-CM | POA: Diagnosis not present

## 2022-10-28 DIAGNOSIS — O209 Hemorrhage in early pregnancy, unspecified: Secondary | ICD-10-CM | POA: Diagnosis not present

## 2022-10-28 DIAGNOSIS — Z3A00 Weeks of gestation of pregnancy not specified: Secondary | ICD-10-CM | POA: Diagnosis not present

## 2022-10-30 ENCOUNTER — Other Ambulatory Visit: Payer: Self-pay

## 2022-11-04 DIAGNOSIS — O0281 Inappropriate change in quantitative human chorionic gonadotropin (hCG) in early pregnancy: Secondary | ICD-10-CM | POA: Diagnosis not present

## 2022-11-10 ENCOUNTER — Other Ambulatory Visit: Payer: Self-pay | Admitting: Medical

## 2022-11-10 ENCOUNTER — Telehealth: Payer: Self-pay | Admitting: Internal Medicine

## 2022-11-10 ENCOUNTER — Other Ambulatory Visit (HOSPITAL_COMMUNITY): Payer: Self-pay

## 2022-11-10 MED ORDER — AMOXICILLIN-POT CLAVULANATE 875-125 MG PO TABS
1.0000 | ORAL_TABLET | Freq: Two times a day (BID) | ORAL | 0 refills | Status: DC
  Filled 2022-11-10: qty 14, 7d supply, fill #0

## 2022-11-10 MED ORDER — FLUCONAZOLE 100 MG PO TABS
100.0000 mg | ORAL_TABLET | Freq: Every day | ORAL | 0 refills | Status: DC
  Filled 2022-11-10: qty 7, 7d supply, fill #0

## 2022-11-10 NOTE — Telephone Encounter (Signed)
Has been on amoxicillin for 7 days already due to ear infection and still has left ear infection and wants to know if you can prescribe Augmentin and diflucan for her.  Send to [facility] long

## 2022-11-11 ENCOUNTER — Other Ambulatory Visit (HOSPITAL_COMMUNITY): Payer: Self-pay

## 2022-11-21 ENCOUNTER — Encounter (HOSPITAL_COMMUNITY): Payer: Self-pay

## 2022-11-25 ENCOUNTER — Telehealth: Payer: Self-pay | Admitting: Internal Medicine

## 2022-11-25 NOTE — Telephone Encounter (Signed)
Pt called and states she has another ear infection. She had Dr. Ardyth Man ped provider look at her ear. Pt has been on Cipro, rocephin, dexamethasone, amoxicillin and Augmentin.  I called and was able to get her worked in to see [facility] ENT on Monday September 30th but she is wondering if she can be put on Omicef in the meantime.

## 2022-11-26 ENCOUNTER — Other Ambulatory Visit (HOSPITAL_COMMUNITY): Payer: Self-pay

## 2022-11-26 ENCOUNTER — Other Ambulatory Visit: Payer: Self-pay | Admitting: Medical

## 2022-11-26 MED ORDER — CEFDINIR 300 MG PO CAPS
300.0000 mg | ORAL_CAPSULE | Freq: Two times a day (BID) | ORAL | 0 refills | Status: DC
  Filled 2022-11-26: qty 20, 10d supply, fill #0

## 2022-11-26 NOTE — Telephone Encounter (Signed)
Pt was notified.  

## 2022-11-27 ENCOUNTER — Other Ambulatory Visit (HOSPITAL_COMMUNITY): Payer: Self-pay

## 2022-11-30 ENCOUNTER — Other Ambulatory Visit (HOSPITAL_COMMUNITY): Payer: Self-pay

## 2022-11-30 ENCOUNTER — Other Ambulatory Visit: Payer: Self-pay | Admitting: Internal Medicine

## 2022-11-30 ENCOUNTER — Other Ambulatory Visit: Payer: Self-pay | Admitting: Medical

## 2022-11-30 ENCOUNTER — Telehealth: Payer: Self-pay | Admitting: Internal Medicine

## 2022-11-30 DIAGNOSIS — Z9889 Other specified postprocedural states: Secondary | ICD-10-CM | POA: Diagnosis not present

## 2022-11-30 DIAGNOSIS — H6993 Unspecified Eustachian tube disorder, bilateral: Secondary | ICD-10-CM | POA: Diagnosis not present

## 2022-11-30 DIAGNOSIS — L299 Pruritus, unspecified: Secondary | ICD-10-CM | POA: Diagnosis not present

## 2022-11-30 MED ORDER — FLUOCINOLONE ACETONIDE 0.01 % OT OIL
4.0000 [drp] | TOPICAL_OIL | Freq: Every evening | OTIC | 1 refills | Status: DC
  Filled 2022-11-30: qty 20, 90d supply, fill #0

## 2022-11-30 MED ORDER — HYDROXYZINE HCL 10 MG PO TABS
10.0000 mg | ORAL_TABLET | Freq: Three times a day (TID) | ORAL | 0 refills | Status: DC | PRN
  Filled 2022-11-30: qty 30, 10d supply, fill #0

## 2022-11-30 NOTE — Telephone Encounter (Signed)
Pt was notified and seen ENT today. She was given drops to help clear up faster and if no response in 2 weeks then she will follow-up and they will recommend next steps.

## 2022-11-30 NOTE — Telephone Encounter (Signed)
Pt would like to know if you can call in Hydroxyzine to help clear up her cough and postnasal. Its less sedating than benadryl she says  Patient will be trying to get pregnant again soon and wants to try to get this cleared up

## 2023-01-04 ENCOUNTER — Other Ambulatory Visit: Payer: Self-pay

## 2023-01-04 NOTE — Progress Notes (Signed)
Patient completed questionnaire but she is not intending to receive medication at this time as she is trying to conceive and may not restart until after first trimester. Spoke with her directly and she requested that we retime her call additional 2 months out to follow up.

## 2023-01-04 NOTE — Progress Notes (Signed)
Pt is not taking medication at this time. She had a recent miscarriage. Once she is able to conceive and complete her first trimester, she will resume medication. Retiming.

## 2023-01-19 DIAGNOSIS — Z8759 Personal history of other complications of pregnancy, childbirth and the puerperium: Secondary | ICD-10-CM | POA: Diagnosis not present

## 2023-01-19 DIAGNOSIS — Z3689 Encounter for other specified antenatal screening: Secondary | ICD-10-CM | POA: Diagnosis not present

## 2023-01-19 DIAGNOSIS — Z3200 Encounter for pregnancy test, result unknown: Secondary | ICD-10-CM | POA: Diagnosis not present

## 2023-01-19 LAB — OB RESULTS CONSOLE ANTIBODY SCREEN: Antibody Screen: NEGATIVE

## 2023-01-21 ENCOUNTER — Other Ambulatory Visit (HOSPITAL_COMMUNITY): Payer: Self-pay

## 2023-01-21 DIAGNOSIS — Z8759 Personal history of other complications of pregnancy, childbirth and the puerperium: Secondary | ICD-10-CM | POA: Diagnosis not present

## 2023-01-21 MED ORDER — PROGESTERONE 200 MG PO CAPS
200.0000 mg | ORAL_CAPSULE | Freq: Every day | ORAL | 2 refills | Status: DC
  Filled 2023-01-21: qty 30, 30d supply, fill #0
  Filled 2023-02-15: qty 30, 30d supply, fill #1

## 2023-01-25 DIAGNOSIS — Z8759 Personal history of other complications of pregnancy, childbirth and the puerperium: Secondary | ICD-10-CM | POA: Diagnosis not present

## 2023-02-02 ENCOUNTER — Encounter (HOSPITAL_COMMUNITY): Payer: Self-pay | Admitting: Pharmacist

## 2023-02-02 ENCOUNTER — Other Ambulatory Visit (HOSPITAL_COMMUNITY): Payer: Self-pay

## 2023-02-02 DIAGNOSIS — Z3201 Encounter for pregnancy test, result positive: Secondary | ICD-10-CM | POA: Diagnosis not present

## 2023-02-02 DIAGNOSIS — O219 Vomiting of pregnancy, unspecified: Secondary | ICD-10-CM | POA: Diagnosis not present

## 2023-02-02 MED ORDER — DOXYLAMINE-PYRIDOXINE 10-10 MG PO TBEC
2.0000 | DELAYED_RELEASE_TABLET | Freq: Every day | ORAL | 3 refills | Status: DC
Start: 2023-02-02 — End: 2023-02-03
  Filled 2023-02-02: qty 30, 15d supply, fill #0

## 2023-02-03 ENCOUNTER — Other Ambulatory Visit: Payer: Self-pay

## 2023-02-03 ENCOUNTER — Other Ambulatory Visit (HOSPITAL_COMMUNITY): Payer: Self-pay

## 2023-02-03 MED ORDER — ONDANSETRON 4 MG PO TBDP
ORAL_TABLET | ORAL | 0 refills | Status: DC
  Filled 2023-02-03: qty 24, 12d supply, fill #0

## 2023-02-17 ENCOUNTER — Other Ambulatory Visit (HOSPITAL_COMMUNITY): Payer: Self-pay

## 2023-02-25 DIAGNOSIS — O09521 Supervision of elderly multigravida, first trimester: Secondary | ICD-10-CM | POA: Diagnosis not present

## 2023-02-25 DIAGNOSIS — Z3A09 9 weeks gestation of pregnancy: Secondary | ICD-10-CM | POA: Diagnosis not present

## 2023-02-25 DIAGNOSIS — Z3689 Encounter for other specified antenatal screening: Secondary | ICD-10-CM | POA: Diagnosis not present

## 2023-03-03 NOTE — L&D Delivery Note (Addendum)
 Delivery Note At 6:30 PM a viable and healthy female was delivered via Vaginal, Spontaneous (Presentation:   Occiput Anterior).  APGAR: 9, 9; weight  .   Placenta status: Spontaneous, Intact.  Cord: 3 vessels with the following complications: None.  Cord pH: NA  Anesthesia: Epidural Episiotomy: None Lacerations:  none  H/o PPHemorrhage last pregnancy. TXA given at placenta delivery and 1000 mcg Cytotec  placed rectally for prevention of hemorrhage   Est. Blood Loss (mL):  650 cc   Mom to postpartum.  Baby to Couplet care / Skin to Skin.  Laurie Reese 09/21/2023, 6:51 PM

## 2023-03-04 DIAGNOSIS — Z36 Encounter for antenatal screening for chromosomal anomalies: Secondary | ICD-10-CM | POA: Diagnosis not present

## 2023-03-04 DIAGNOSIS — Z3481 Encounter for supervision of other normal pregnancy, first trimester: Secondary | ICD-10-CM | POA: Diagnosis not present

## 2023-03-04 DIAGNOSIS — Z3A10 10 weeks gestation of pregnancy: Secondary | ICD-10-CM | POA: Diagnosis not present

## 2023-03-04 DIAGNOSIS — Z3689 Encounter for other specified antenatal screening: Secondary | ICD-10-CM | POA: Diagnosis not present

## 2023-03-04 DIAGNOSIS — O09521 Supervision of elderly multigravida, first trimester: Secondary | ICD-10-CM | POA: Diagnosis not present

## 2023-03-04 LAB — OB RESULTS CONSOLE GC/CHLAMYDIA
Chlamydia: NEGATIVE
Neisseria Gonorrhea: NEGATIVE

## 2023-03-04 LAB — OB RESULTS CONSOLE HIV ANTIBODY (ROUTINE TESTING): HIV: NONREACTIVE

## 2023-03-04 LAB — OB RESULTS CONSOLE RUBELLA ANTIBODY, IGM: Rubella: IMMUNE

## 2023-03-04 LAB — OB RESULTS CONSOLE RPR: RPR: NONREACTIVE

## 2023-03-04 LAB — OB RESULTS CONSOLE HEPATITIS B SURFACE ANTIGEN: Hepatitis B Surface Ag: NEGATIVE

## 2023-03-05 DIAGNOSIS — H5213 Myopia, bilateral: Secondary | ICD-10-CM | POA: Diagnosis not present

## 2023-03-12 ENCOUNTER — Other Ambulatory Visit (HOSPITAL_COMMUNITY): Payer: Self-pay

## 2023-03-12 ENCOUNTER — Telehealth: Payer: Self-pay | Admitting: Internal Medicine

## 2023-03-12 DIAGNOSIS — H66012 Acute suppurative otitis media with spontaneous rupture of ear drum, left ear: Secondary | ICD-10-CM

## 2023-03-12 MED ORDER — CEFDINIR 300 MG PO CAPS
300.0000 mg | ORAL_CAPSULE | Freq: Two times a day (BID) | ORAL | 1 refills | Status: DC
Start: 2023-03-12 — End: 2023-03-26
  Filled 2023-03-12: qty 20, 10d supply, fill #0

## 2023-03-12 NOTE — Telephone Encounter (Signed)
 OB says she can't take diflucan until after her 1st trimester, so only needs cednifir

## 2023-03-12 NOTE — Telephone Encounter (Signed)
 my sister works at [employer] and the MD there looked in her ear and she has an ear infection. can we call out Cefidinir 300mg  in for her since we have no openings left. She gets yeast infections too, so wants to know if we can call in diflufan too. She is going to call her Ob to make sure she can take those medication since she is [redacted] weeks pregnant

## 2023-03-12 NOTE — Telephone Encounter (Signed)
 I have sent in cefidinir  to pharmacy for 10 days at twice a day

## 2023-03-16 DIAGNOSIS — Z3A12 12 weeks gestation of pregnancy: Secondary | ICD-10-CM | POA: Diagnosis not present

## 2023-03-16 DIAGNOSIS — O09521 Supervision of elderly multigravida, first trimester: Secondary | ICD-10-CM | POA: Diagnosis not present

## 2023-03-16 DIAGNOSIS — Z118 Encounter for screening for other infectious and parasitic diseases: Secondary | ICD-10-CM | POA: Diagnosis not present

## 2023-03-16 DIAGNOSIS — Z36 Encounter for antenatal screening for chromosomal anomalies: Secondary | ICD-10-CM | POA: Diagnosis not present

## 2023-03-26 ENCOUNTER — Other Ambulatory Visit (HOSPITAL_COMMUNITY): Payer: Self-pay

## 2023-03-26 ENCOUNTER — Other Ambulatory Visit: Payer: Self-pay | Admitting: Medical

## 2023-03-26 ENCOUNTER — Telehealth: Payer: Self-pay | Admitting: Internal Medicine

## 2023-03-26 MED ORDER — AMOXICILLIN 875 MG PO TABS
875.0000 mg | ORAL_TABLET | Freq: Two times a day (BID) | ORAL | 0 refills | Status: DC
  Filled 2023-03-26: qty 20, 10d supply, fill #0

## 2023-03-26 MED ORDER — AMOXICILLIN-POT CLAVULANATE 875-125 MG PO TABS
1.0000 | ORAL_TABLET | Freq: Two times a day (BID) | ORAL | 0 refills | Status: DC
  Filled 2023-03-26: qty 20, 10d supply, fill #0

## 2023-03-26 NOTE — Telephone Encounter (Signed)
Pt states she still has an ear infection  and a provider at her office as looked at her ear and wants to know if you can prescribe augmentin for her. She has been advised to contact ENT for a follow-up but they are booked out. She is [redacted] weeks pregnant. Send to [place] long  She was given Cefdinir on 1/10 by [place].

## 2023-03-26 NOTE — Telephone Encounter (Signed)
Spoke to [place] and we need to do Augmentin instead since she just had Cefirinir earlier this month. Amoxicillin might have worked but we will switch.  Spoke with patient and she is aware that augmentin was sent in. She is waiting on a call back from the NP at ENT

## 2023-03-29 ENCOUNTER — Other Ambulatory Visit (HOSPITAL_COMMUNITY): Payer: Self-pay

## 2023-03-29 DIAGNOSIS — Z9889 Other specified postprocedural states: Secondary | ICD-10-CM | POA: Diagnosis not present

## 2023-03-29 DIAGNOSIS — H6122 Impacted cerumen, left ear: Secondary | ICD-10-CM | POA: Diagnosis not present

## 2023-03-29 DIAGNOSIS — H938X2 Other specified disorders of left ear: Secondary | ICD-10-CM | POA: Diagnosis not present

## 2023-03-29 MED ORDER — ONDANSETRON 4 MG PO TBDP
4.0000 mg | ORAL_TABLET | Freq: Two times a day (BID) | ORAL | 0 refills | Status: DC | PRN
  Filled 2023-03-29: qty 24, 12d supply, fill #0

## 2023-03-30 ENCOUNTER — Other Ambulatory Visit (HOSPITAL_COMMUNITY): Payer: Self-pay

## 2023-03-31 ENCOUNTER — Other Ambulatory Visit: Payer: Self-pay

## 2023-03-31 NOTE — Progress Notes (Signed)
Patient has decided to stop Humira while she is pregnant. Disenrolling from [employer].

## 2023-04-07 ENCOUNTER — Other Ambulatory Visit (HOSPITAL_COMMUNITY): Payer: Self-pay

## 2023-04-07 MED ORDER — TERCONAZOLE 0.8 % VA CREA
TOPICAL_CREAM | VAGINAL | 0 refills | Status: DC
  Filled 2023-04-07: qty 20, 3d supply, fill #0

## 2023-04-12 ENCOUNTER — Ambulatory Visit: Payer: Commercial Managed Care - PPO | Admitting: Nurse Practitioner

## 2023-04-12 ENCOUNTER — Encounter: Payer: Self-pay | Admitting: Nurse Practitioner

## 2023-04-12 ENCOUNTER — Other Ambulatory Visit (HOSPITAL_BASED_OUTPATIENT_CLINIC_OR_DEPARTMENT_OTHER): Payer: Self-pay

## 2023-04-12 ENCOUNTER — Other Ambulatory Visit (HOSPITAL_COMMUNITY): Payer: Self-pay

## 2023-04-12 VITALS — BP 110/70 | HR 117 | Temp 98.8°F | Wt 247.0 lb

## 2023-04-12 DIAGNOSIS — H6690 Otitis media, unspecified, unspecified ear: Secondary | ICD-10-CM | POA: Insufficient documentation

## 2023-04-12 DIAGNOSIS — L0231 Cutaneous abscess of buttock: Secondary | ICD-10-CM | POA: Diagnosis not present

## 2023-04-12 DIAGNOSIS — L03317 Cellulitis of buttock: Secondary | ICD-10-CM | POA: Diagnosis not present

## 2023-04-12 DIAGNOSIS — B379 Candidiasis, unspecified: Secondary | ICD-10-CM | POA: Diagnosis not present

## 2023-04-12 MED ORDER — TERCONAZOLE 0.8 % VA CREA
TOPICAL_CREAM | VAGINAL | 0 refills | Status: DC
  Filled 2023-04-12: qty 20, 3d supply, fill #0

## 2023-04-12 MED ORDER — TRAMADOL HCL 50 MG PO TABS
50.0000 mg | ORAL_TABLET | Freq: Three times a day (TID) | ORAL | 0 refills | Status: DC | PRN
  Filled 2023-04-12 (×2): qty 20, 7d supply, fill #0

## 2023-04-12 MED ORDER — AZITHROMYCIN 250 MG PO TABS
ORAL_TABLET | ORAL | 0 refills | Status: AC
  Filled 2023-04-12: qty 6, 5d supply, fill #0

## 2023-04-12 MED ORDER — CEFPODOXIME PROXETIL 200 MG PO TABS
200.0000 mg | ORAL_TABLET | Freq: Two times a day (BID) | ORAL | 0 refills | Status: DC
  Filled 2023-04-12: qty 14, 7d supply, fill #0

## 2023-04-12 NOTE — Assessment & Plan Note (Addendum)
 Risks and benefits of I&D discussed with patient and joint decision to proceed made. Verbal consent obtained.  Area cleansed with betadine x2 to a minimum of 20cm of surrounding tissue given location.  Initial incision made with the aid of sprayable skin anesthetic. Incision painful and decision to attempt infiltrative block.  Using sterile technique, area surrounding fluctuant tissue infiltrated with 1% lidocaine  buffered with sodium bicarbonate at a 3:1 ratio. Once anesthetized, incision made using #11 blade into the center of the abscessed region. Moderate amount of purulent thin liquid drained. Minimal manual pressure tolerated.  Iodoform gauze placed inside the incision to help keep the area open and allow for further drainage.  Dressing placed.  Patient instructed to leave dressing in place for 2 days then remove and clean area well. If dressing is saturated prior to this time, oK to change Laurie Reese. Recommend avoiding removal of wick until no further drainage present.  Oral antibiotics started to avoid recent antibiotic treatments and ensure pregnancy safety. Tramadol  for pain. F/U in 2 days if no improvement.

## 2023-04-12 NOTE — Progress Notes (Signed)
 Annella Kief, DNP, AGNP-c [facility] [address] [place], [place] [address] [phone]   ACUTE VISIT- ESTABLISHED PATIENT  Blood pressure 110/70, pulse (!) 117, temperature 98.8 F (37.1 C), weight 247 lb (112 kg).  Subjective:  HPI Laurie Reese is a 38 y.o. female presents to day for evaluation of acute concern(s).   Stefhanie presents with painful abscess to the right buttock at midline. She reports the symptoms have been present for the last couple of days, but are getting worse. She is unable to sit. At this time she is [redacted] weeks pregnant.  Of note, she also has been on antibiotics for the past several weeks with augmentin  and cephalosporins. She had some remaining Keflex  that she started over the weekend under the advice of a provider friend.   ROS negative except for what is listed in HPI. History, Medications, Surgery, SDOH, and Family History reviewed and updated as appropriate.  Objective:  Physical Exam Vitals and nursing note reviewed.  Constitutional:      General: She is in acute distress.  HENT:     Ears:     Comments: Left ear shows purulent effusion present. Erythema and bulging noted.  Eyes:     Conjunctiva/sclera: Conjunctivae normal.  Cardiovascular:     Rate and Rhythm: Normal rate and regular rhythm.     Pulses: Normal pulses.  Pulmonary:     Effort: Pulmonary effort is normal.  Skin:    General: Skin is warm and dry.     Capillary Refill: Capillary refill takes less than 2 seconds.     Findings: Abscess present.          Comments: 3 cm area of induration with appx 6cm firm surrounding tissue. Entire area erythematous and tender to light touch.   Neurological:     Mental Status: She is alert and oriented to person, place, and time.         Assessment & Plan:   Problem List Items Addressed This Visit     Abscess and cellulitis of gluteal region - Primary   Risks and benefits of I&D discussed with patient and joint  decision to proceed made. Verbal consent obtained.  Area cleansed with betadine x2 to a minimum of 20cm of surrounding tissue given location.  Initial incision made with the aid of sprayable skin anesthetic. Incision painful and decision to attempt infiltrative block.  Using sterile technique, area surrounding fluctuant tissue infiltrated with 1% lidocaine  buffered with sodium bicarbonate at a 3:1 ratio. Once anesthetized, incision made using #11 blade into the center of the abscessed region. Moderate amount of purulent thin liquid drained. Minimal manual pressure tolerated.  Iodoform gauze placed inside the incision to help keep the area open and allow for further drainage.  Dressing placed.  Patient instructed to leave dressing in place for 2 days then remove and clean area well. If dressing is saturated prior to this time, oK to change Kathye Cipriani. Recommend avoiding removal of wick until no further drainage present.  Oral antibiotics started to avoid recent antibiotic treatments and ensure pregnancy safety. Tramadol  for pain. F/U in 2 days if no improvement.       Relevant Medications   cefpodoxime  (VANTIN ) 200 MG tablet   azithromycin  (ZITHROMAX ) 250 MG tablet   traMADol  (ULTRAM ) 50 MG tablet   Other Relevant Orders   Anaerobic and Aerobic Culture   Subacute otitis media   Ongoing otitis media present. Treatment with oral antibiotics restarted.  Relevant Medications   cefpodoxime  (VANTIN ) 200 MG tablet   azithromycin  (ZITHROMAX ) 250 MG tablet   Other Visit Diagnoses       Candida infection       Relevant Medications   cefpodoxime  (VANTIN ) 200 MG tablet   azithromycin  (ZITHROMAX ) 250 MG tablet   terconazole  (TERAZOL 3 ) 0.8 % vaginal cream         Annella Kief, DNP, AGNP-c

## 2023-04-12 NOTE — Patient Instructions (Addendum)
 If you can tolerate it, in 2 days try a sitz bath with epsom salts at least once a day. This will help relieve some of the pain and pressure.   I have sent in 2 different antibiotics and pain medication. All are pregnancy safe. Take the pain medication as little as possible as this can make you sleepy.   Let me know if this is not improving in 2 days.   Incision and Drainage, Care After After incision and drainage, it is common to have: Pain or discomfort around the incision site. Blood, fluid, or pus (drainage) from the incision. Redness and firm skin around the incision site. Follow these instructions at home: Medicines Take over-the-counter and prescription medicines only as told by your health care provider. If you were prescribed antibiotics, take them as told by your provider. Do not stop using the antibiotic even if you start to feel better. Do not apply creams, ointments, or liquids unless you have been told to by your provider. Wound care Follow instructions from your provider about how to take care of your wound. Make sure you: Wash your hands with soap and water for at least 20 seconds before and after you change your bandage (dressing). If soap and water are not available, use hand sanitizer. Change your dressing and any packing as told by your provider. If the dressing is dry or stuck when you try to remove it, moisten or wet it with saline or water. This will help you remove it without harming your skin or tissues. If your wound is packed, leave it in place until your provider tells you to remove it. To remove it, moisten or wet the packing with saline or water. Leave stitches (sutures), skin glue, or tape strips in place. These skin closures may need to stay in place for 2 weeks or longer. If tape strip edges start to loosen and curl up, you may trim the loose edges. Do not remove tape strips completely unless your provider tells you to do that. Check your wound every day for  signs of infection. Check for: More redness, swelling, or pain. More fluid or blood. Warmth. Pus or a bad smell. If you were sent home with a drain tube in place, follow instructions from your provider about: How to empty it. How to care for it at home. Be careful when you get rid of used dressings, wound packing, or drainage. Activity Rest the affected area. Return to your normal activities as told by your provider. Ask your provider what activities are safe for you. General instructions Do not use any products that contain nicotine or tobacco. These products include cigarettes, chewing tobacco, and vaping devices, such as e-cigarettes. These can delay incision healing after surgery. If you need help quitting, ask your provider. Do not take baths, swim, or use a hot tub until your provider approves. Ask your provider if you may take showers. You may only be allowed to take sponge baths. The incision will keep draining. It is normal to have some clear or slightly bloody drainage. The amount of drainage should go down each day. Keep all follow-up visits. Your provider will need to make sure that your incision is healing well and that there are no problems. Your health care provider may give you more instructions. Make sure you know what you can and cannot do Contact a health care provider if: Your cyst or abscess comes back. You have any signs of infection. You notice red streaks that spread  away from the incision site. You have a fever or chills. Get help right away if: You have severe pain or bleeding. You become short of breath. You have chest pain. You have signs of a severe infection. You may notice changes in your incision area, such as: Swelling that makes the skin feel hard. Numbness or tingling. Sudden increase in redness. Your skin color may change from red to purple, and then to dark spots. Blisters, ulcers, or splitting of the skin. These symptoms may be an emergency. Get  help right away. Call 911. Do not wait to see if the symptoms will go away. Do not drive yourself to the hospital. This information is not intended to replace advice given to you by your health care provider. Make sure you discuss any questions you have with your health care provider. Document Revised: 10/06/2021 Document Reviewed: 10/06/2021 Elsevier Patient Education  2024 [employer].

## 2023-04-12 NOTE — Assessment & Plan Note (Signed)
 Ongoing otitis media present. Treatment with oral antibiotics restarted.

## 2023-04-14 DIAGNOSIS — Z361 Encounter for antenatal screening for raised alphafetoprotein level: Secondary | ICD-10-CM | POA: Diagnosis not present

## 2023-04-14 DIAGNOSIS — Z3A16 16 weeks gestation of pregnancy: Secondary | ICD-10-CM | POA: Diagnosis not present

## 2023-04-14 DIAGNOSIS — O09522 Supervision of elderly multigravida, second trimester: Secondary | ICD-10-CM | POA: Diagnosis not present

## 2023-04-16 LAB — ANAEROBIC AND AEROBIC CULTURE

## 2023-04-20 ENCOUNTER — Encounter: Payer: Self-pay | Admitting: Medical

## 2023-04-20 ENCOUNTER — Ambulatory Visit (INDEPENDENT_AMBULATORY_CARE_PROVIDER_SITE_OTHER): Payer: Commercial Managed Care - PPO | Admitting: Medical

## 2023-04-20 VITALS — BP 114/72 | HR 90 | Ht 67.0 in | Wt 249.0 lb

## 2023-04-20 DIAGNOSIS — H938X2 Other specified disorders of left ear: Secondary | ICD-10-CM | POA: Diagnosis not present

## 2023-04-20 DIAGNOSIS — Z8639 Personal history of other endocrine, nutritional and metabolic disease: Secondary | ICD-10-CM

## 2023-04-20 DIAGNOSIS — Z0000 Encounter for general adult medical examination without abnormal findings: Secondary | ICD-10-CM

## 2023-04-20 DIAGNOSIS — Z3A17 17 weeks gestation of pregnancy: Secondary | ICD-10-CM

## 2023-04-20 DIAGNOSIS — H6690 Otitis media, unspecified, unspecified ear: Secondary | ICD-10-CM | POA: Diagnosis not present

## 2023-04-20 NOTE — Progress Notes (Signed)
Subjective:   HPI  Laurie Reese is a 38 y.o. female who presents for Chief Complaint  Patient presents with   Annual Exam    Fasting cpe, no concerns    Medical Team Dr. Carlus Pavlov, endocrinology Dr. Olivia Mackie, OB/Gyn Dr. Janalyn Harder, dermatology prior Dr. Jimmey Ralph, optometry Dr. Kyra Leyland, dentist Odena Mcquaid, Kermit Balo, PA-C here for primary care   Concerns: Here for well visit.    Is Pregnant - 17 weeks, 2nd pregnancy  Has been dealing with chronic ear infection issues.  Saw ENT recently for this, and she still has some fullness in ears.  Been dealing with recent I&D in buttocks, mostly improved, just finished antibiotics yesterday.   No current drainage,much less redness and pain.    Reviewed their medical, surgical, family, social, medication, and allergy history and updated chart as appropriate.  Past Medical History:  Diagnosis Date   HPV vaccine counseling    Gardasil series completed ...    HSV-1 infection    Maternal anemia, with delivery    Migraine    Postpartum thyroiditis 06/07/2020   Reactive airway disease    rare flare up    Family History  Problem Relation Age of Onset   Hypertension Mother    Diabetes Mother    Diabetes Father    Crohn's disease Sister    Cancer Maternal Aunt        breast   Heart disease Maternal Grandmother    Cancer Maternal Grandfather        prostate   Diabetes Maternal Grandfather    Hypertension Maternal Grandfather    Heart disease Maternal Grandfather    Prostate cancer Maternal Grandfather    Colon cancer Neg Hx      Current Outpatient Medications:    ondansetron (ZOFRAN-ODT) 4 MG disintegrating tablet, Take 1 tablet (4 mg total) by mouth 2 (two) times daily as needed., Disp: 24 tablet, Rfl: 0   Prenatal Vit-Fe Fumarate-FA (PRENATAL MULTIVITAMIN) TABS tablet, Take 1 tablet by mouth daily at 12 noon., Disp: , Rfl:   Allergies  Allergen Reactions   Celexa [Citalopram] Swelling    Swelling  on face   Ciprodex [Ciprofloxacin-Dexamethasone] Swelling    Swelling on face      Review of Systems  Constitutional:  Negative for chills, fever, malaise/fatigue and weight loss.  HENT:  Negative for congestion, ear pain, hearing loss, sore throat and tinnitus.        Left ear fullness  Eyes:  Negative for blurred vision, pain and redness.  Respiratory:  Negative for cough, hemoptysis and shortness of breath.   Cardiovascular:  Negative for chest pain, palpitations, orthopnea, claudication and leg swelling.  Gastrointestinal:  Negative for abdominal pain, blood in stool, constipation, diarrhea, nausea and vomiting.  Genitourinary:  Negative for dysuria, flank pain, frequency, hematuria and urgency.  Musculoskeletal:  Negative for falls, joint pain and myalgias.  Skin:  Negative for itching and rash.  Neurological:  Negative for dizziness, tingling, speech change, weakness and headaches.  Endo/Heme/Allergies:  Negative for polydipsia. Does not bruise/bleed easily.  Psychiatric/Behavioral:  Negative for depression and memory loss. The patient is not nervous/anxious and does not have insomnia.          04/20/2023    9:12 AM 03/30/2022    2:59 PM 03/04/2022    8:20 AM 11/19/2021   10:14 AM 09/19/2021    1:33 PM  Depression screen PHQ 2/9  Decreased Interest 0 0 0 0 0  Down, Depressed,  Hopeless 0 0 0 0 0  PHQ - 2 Score 0 0 0 0 0       Objective:  BP 114/72   Pulse 90   Ht 5\' 7"  (1.702 m)   Wt 249 lb (112.9 kg)   SpO2 98%   BMI 39.00 kg/m   BP Readings from Last 3 Encounters:  04/20/23 114/72  04/12/23 110/70  03/30/22 110/70   Wt Readings from Last 3 Encounters:  04/20/23 249 lb (112.9 kg)  04/12/23 247 lb (112 kg)  03/30/22 211 lb 6.4 oz (95.9 kg)    General appearance: alert, no distress, WD/WN, Caucasian female Skin: scattered macules, left buttock superiorly with healing round lesion from recent I&D (exam chaperoned by nurse), no worrisome lesions, tattoos bilat  volar wrist and cross tattoo on back of neck HEENT: normocephalic, conjunctiva/corneas normal, sclerae anicteric, PERRLA, EOMi, left TM with some scar tissues, some cream colored appearance, but no erythema, no obvious air fluid levels, right TM normal Neck: supple, no lymphadenopathy, no thyromegaly, no masses, normal ROM, no bruits Chest: non tender, normal shape and expansion Heart: RRR, normal S1, S2, no murmurs Lungs: CTA bilaterally, no wheezes, rhonchi, or rales Abdomen: +bs, soft, non tender, non distended, no masses, no hepatomegaly, no splenomegaly, no bruits Back: non tender, normal ROM, no scoliosis Musculoskeletal: upper extremities non tender, no obvious deformity, normal ROM throughout, lower extremities non tender, no obvious deformity, normal ROM throughout Extremities: no edema, no cyanosis, no clubbing Pulses: 2+ symmetric, upper and lower extremities, normal cap refill Neurological: alert, oriented x 3, CN2-12 intact, strength normal upper extremities and lower extremities, sensation normal throughout, DTRs 2+ throughout, no cerebellar signs, gait normal Psychiatric: normal affect, behavior normal, pleasant  Breast/gyn/rectal - deferred to gynecology      Assessment and Plan :   Encounter Diagnoses  Name Primary?   Encounter for health maintenance examination in adult Yes   History of thyroiditis    [redacted] weeks gestation of pregnancy    Chronic otitis media, unspecified otitis media type    Ear fullness, left     This visit was a preventative care visit, also known as wellness visit or routine physical.   Topics typically include healthy lifestyle, diet, exercise, preventative care, vaccinations, sick and well care, proper use of emergency dept and after hours care, as well as other concerns.     Recommendations: Continue to return yearly for your annual wellness and preventative care visits.  This gives [place] a chance to discuss healthy lifestyle, exercise,  vaccinations, review your chart record, and perform screenings where appropriate.  I recommend you see your eye doctor yearly for routine vision care.  I recommend you see your dentist yearly for routine dental care including hygiene visits twice yearly.  See your gynecologist yearly for routine gynecological care.    Vaccination recommendations were reviewed Immunization History  Administered Date(s) Administered   HIB (PRP-OMP) 07/06/1989   Hepatitis B, ADULT 10/26/1999, 12/31/1999, 06/15/2000   Influenza Nasal 11/16/2013   Influenza Split 11/21/2010, 11/01/2012, 01/03/2014   Influenza-Unspecified 11/30/2020, 11/10/2022   MMR 07/06/1989, 09/09/1993   PFIZER(Purple Top)SARS-COV-2 Vaccination 07/15/2019, 08/11/2019, 02/16/2020   PPD Test 05/30/2008   Tdap 05/29/2008, 07/13/2018, 06/27/2019   Varicella 07/26/2008    Screening for cancer: Colon cancer screening: Age 59  Breast cancer screening: You should perform a self breast exam monthly.   We reviewed recommendations for regular mammograms and breast cancer screening.  Cervical cancer screening: We reviewed recommendations for pap smear screening.  Skin cancer screening: Check your skin regularly for new changes, growing lesions, or other lesions of concern Come in for evaluation if you have skin lesions of concern.  Lung cancer screening: If you have a greater than 20 pack year history of tobacco use, then you may qualify for lung cancer screening with a chest CT scan.   Please call your insurance company to inquire about coverage for this test.  We currently don't have screenings for other cancers besides breast, cervical, colon, and lung cancers.  If you have a strong family history of cancer or have other cancer screening concerns, please let me know.    Bone health: Get at least 150 minutes of aerobic exercise weekly Get weight bearing exercise at least once weekly Bone density test:  A bone density test is  an imaging test that uses a type of X-ray to measure the amount of calcium and other minerals in your bones. The test may be used to diagnose or screen you for a condition that causes weak or thin bones (osteoporosis), predict your risk for a broken bone (fracture), or determine how well your osteoporosis treatment is working. The bone density test is recommended for females 65 and older, or females or males <65 if certain risk factors such as thyroid disease, long term use of steroids such as for asthma or rheumatological issues, vitamin D deficiency, estrogen deficiency, family history of osteoporosis, self or family history of fragility fracture in first degree relative.    Heart health: Get at least 150 minutes of aerobic exercise weekly Limit alcohol It is important to maintain a healthy blood pressure and healthy cholesterol numbers  Heart disease screening: Screening for heart disease includes screening for blood pressure, fasting lipids, glucose/diabetes screening, BMI height to weight ratio, reviewed of smoking status, physical activity, and diet.    Goals include blood pressure 120/80 or less, maintaining a healthy lipid/cholesterol profile, preventing diabetes or keeping diabetes numbers under good control, not smoking or using tobacco products, exercising most days per week or at least 150 minutes per week of exercise, and eating healthy variety of fruits and vegetables, healthy oils, and avoiding unhealthy food choices like fried food, fast food, high sugar and high cholesterol foods.    Other tests may possibly include EKG test, CT coronary calcium score, echocardiogram, exercise treadmill stress test.     Medical care options: I recommend you continue to seek care here first for routine care.  We try really hard to have available appointments Monday through Friday daytime hours for sick visits, acute visits, and physicals.  Urgent care should be used for after hours and weekends  for significant issues that cannot wait till the next day.  The emergency department should be used for significant potentially life-threatening emergencies.  The emergency department is expensive, can often have long wait times for less significant concerns, so try to utilize primary care, urgent care, or telemedicine when possible to avoid unnecessary trips to the emergency department.  Virtual visits and telemedicine have been introduced since the pandemic started in 2020, and can be convenient ways to receive medical care.  We offer virtual appointments as well to assist you in a variety of options to seek medical care.    Separate significant issues discussed: Pregnancy - seeing OB, 17 weeks  Recent I&D, improved significantly, continue warm compresses the next week, can use topical lotion  Chronic ear fullness - referral back to ENT   Diamante was seen today for annual exam.  Diagnoses and all orders for this visit:  Encounter for health maintenance examination in adult -     CBC -     TSH + free T4 -     Comprehensive metabolic panel -     Lipid panel  History of thyroiditis -     TSH + free T4  [redacted] weeks gestation of pregnancy  Chronic otitis media, unspecified otitis media type -     Ambulatory referral to ENT  Ear fullness, left -     Ambulatory referral to ENT    Follow-up pending labs, yearly for physical

## 2023-04-21 LAB — COMPREHENSIVE METABOLIC PANEL
ALT: 19 [IU]/L (ref 0–32)
AST: 14 [IU]/L (ref 0–40)
Albumin: 3.7 g/dL — ABNORMAL LOW (ref 3.9–4.9)
Alkaline Phosphatase: 54 [IU]/L (ref 44–121)
BUN/Creatinine Ratio: 11 (ref 9–23)
BUN: 7 mg/dL (ref 6–20)
Bilirubin Total: 0.3 mg/dL (ref 0.0–1.2)
CO2: 18 mmol/L — ABNORMAL LOW (ref 20–29)
Calcium: 9 mg/dL (ref 8.7–10.2)
Chloride: 103 mmol/L (ref 96–106)
Creatinine, Ser: 0.62 mg/dL (ref 0.57–1.00)
Globulin, Total: 2.8 g/dL (ref 1.5–4.5)
Glucose: 79 mg/dL (ref 70–99)
Potassium: 4.2 mmol/L (ref 3.5–5.2)
Sodium: 136 mmol/L (ref 134–144)
Total Protein: 6.5 g/dL (ref 6.0–8.5)
eGFR: 119 mL/min/{1.73_m2} (ref >59)

## 2023-04-21 LAB — CBC
Hematocrit: 37.3 % (ref 34.0–46.6)
Hemoglobin: 12.5 g/dL (ref 11.1–15.9)
MCH: 29.9 pg (ref 26.6–33.0)
MCHC: 33.5 g/dL (ref 31.5–35.7)
MCV: 89 fL (ref 79–97)
Platelets: 266 10*3/uL (ref 150–450)
RBC: 4.18 x10E6/uL (ref 3.77–5.28)
RDW: 13.1 % (ref 11.7–15.4)
WBC: 8.2 10*3/uL (ref 3.4–10.8)

## 2023-04-21 LAB — TSH+FREE T4
Free T4: 0.93 ng/dL (ref 0.82–1.77)
TSH: 1.54 u[IU]/mL (ref 0.450–4.500)

## 2023-04-21 LAB — LIPID PANEL
Chol/HDL Ratio: 3.3 {ratio} (ref 0.0–4.4)
Cholesterol, Total: 158 mg/dL (ref 100–199)
HDL: 48 mg/dL (ref >39)
LDL Chol Calc (NIH): 76 mg/dL (ref 0–99)
Triglycerides: 202 mg/dL — ABNORMAL HIGH (ref 0–149)
VLDL Cholesterol Cal: 34 mg/dL (ref 5–40)

## 2023-04-21 NOTE — Progress Notes (Signed)
 Results sent through MyChart

## 2023-04-22 ENCOUNTER — Encounter: Payer: Self-pay | Admitting: Nurse Practitioner

## 2023-05-04 DIAGNOSIS — Z3A19 19 weeks gestation of pregnancy: Secondary | ICD-10-CM | POA: Diagnosis not present

## 2023-05-04 DIAGNOSIS — O09522 Supervision of elderly multigravida, second trimester: Secondary | ICD-10-CM | POA: Diagnosis not present

## 2023-05-14 ENCOUNTER — Other Ambulatory Visit (HOSPITAL_COMMUNITY): Payer: Self-pay

## 2023-05-18 NOTE — Telephone Encounter (Signed)
 t I also need to add that my ear has been filling weird again so I had Chloe look at my ear and she said it is infected again. I dont have an ENT appt until April 16th. Can something be called out again

## 2023-05-19 ENCOUNTER — Other Ambulatory Visit: Payer: Self-pay | Admitting: Medical

## 2023-05-19 ENCOUNTER — Other Ambulatory Visit (HOSPITAL_COMMUNITY): Payer: Self-pay

## 2023-05-19 DIAGNOSIS — Z3A21 21 weeks gestation of pregnancy: Secondary | ICD-10-CM | POA: Diagnosis not present

## 2023-05-19 DIAGNOSIS — O09522 Supervision of elderly multigravida, second trimester: Secondary | ICD-10-CM | POA: Diagnosis not present

## 2023-05-19 DIAGNOSIS — Z362 Encounter for other antenatal screening follow-up: Secondary | ICD-10-CM | POA: Diagnosis not present

## 2023-05-19 MED ORDER — TERBINAFINE HCL 1 % EX CREA
1.0000 | TOPICAL_CREAM | Freq: Two times a day (BID) | CUTANEOUS | 0 refills | Status: DC
  Filled 2023-05-19 – 2023-05-20 (×3): qty 30, 15d supply, fill #0

## 2023-05-19 NOTE — Telephone Encounter (Signed)
 Pt called and states that Lamisil cream is over $20 OTC. Is there another option  She called ENT but they told her nothing is available for her to be seen. She is scheduled with a new ENT for 2nd opionin due to [facility] blowing her off saying she didn't have a ear infection.   I have reached out to Stan Head at [facility] to see if we can get patient seen sooner.

## 2023-05-20 ENCOUNTER — Telehealth (INDEPENDENT_AMBULATORY_CARE_PROVIDER_SITE_OTHER): Payer: Self-pay | Admitting: Otolaryngology

## 2023-05-20 ENCOUNTER — Ambulatory Visit (INDEPENDENT_AMBULATORY_CARE_PROVIDER_SITE_OTHER): Payer: Self-pay | Admitting: Audiology

## 2023-05-20 ENCOUNTER — Other Ambulatory Visit (HOSPITAL_COMMUNITY): Payer: Self-pay

## 2023-05-20 ENCOUNTER — Encounter (INDEPENDENT_AMBULATORY_CARE_PROVIDER_SITE_OTHER): Payer: Self-pay

## 2023-05-20 ENCOUNTER — Encounter: Payer: Self-pay | Admitting: Obstetrics and Gynecology

## 2023-05-20 ENCOUNTER — Ambulatory Visit (INDEPENDENT_AMBULATORY_CARE_PROVIDER_SITE_OTHER): Admitting: Otolaryngology

## 2023-05-20 VITALS — BP 135/86 | HR 107 | Ht 67.0 in | Wt 252.0 lb

## 2023-05-20 DIAGNOSIS — H9012 Conductive hearing loss, unilateral, left ear, with unrestricted hearing on the contralateral side: Secondary | ICD-10-CM

## 2023-05-20 DIAGNOSIS — H938X2 Other specified disorders of left ear: Secondary | ICD-10-CM

## 2023-05-20 DIAGNOSIS — H7322 Unspecified myringitis, left ear: Secondary | ICD-10-CM

## 2023-05-20 DIAGNOSIS — Z9889 Other specified postprocedural states: Secondary | ICD-10-CM

## 2023-05-20 DIAGNOSIS — H6992 Unspecified Eustachian tube disorder, left ear: Secondary | ICD-10-CM

## 2023-05-20 MED ORDER — AMOXICILLIN-POT CLAVULANATE 875-125 MG PO TABS
1.0000 | ORAL_TABLET | Freq: Two times a day (BID) | ORAL | 0 refills | Status: AC
  Filled 2023-05-20: qty 14, 7d supply, fill #0

## 2023-05-20 MED ORDER — PREDNISONE 20 MG PO TABS
20.0000 mg | ORAL_TABLET | Freq: Every day | ORAL | 0 refills | Status: DC
  Filled 2023-05-20: qty 6, 6d supply, fill #0

## 2023-05-20 NOTE — Telephone Encounter (Signed)
 Patient is scheduled for today for [facility]NT with Dr. Allena Katz

## 2023-05-20 NOTE — Progress Notes (Signed)
  [address] [place], [place] [address] [phone]  Audiological Evaluation    Name: Laurie Reese     DOB:   1985/05/18      MRN:   [id number]                                                                                     Service Date: 05/20/2023     Accompanied by: unaccompanied   Patient comes today after Dr. Allena Katz, ENT sent a referral for a hearing evaluation due to concerns with left ear infection.   Symptoms Yes Details  Hearing loss  [x]  Has been seen previously for hearing evaluations - no audiograms were found in EPIC   Tinnitus  []    Ear pain/ infections/pressure  [x]  Left side pressure, left ear infection  Balance problems  []    Noise exposure history  []    Previous ear surgeries  [x]  Reports at least one set of PE tubes as a child and left tympanoplasty around 2011  Family history of hearing loss  []    Amplification  []    Other  []      Otoscopy: Right ear: Clear external ear canals and notable landmarks visualized on the tympanic membrane. Left ear:  Clear external ear canals and notable landmarks visualized on the tympanic membrane.  Tympanometry: Right ear: Type Ad- Normal external ear canal volume with normal middle ear pressure and high tympanic membrane compliance Left ear: Type B- Normal external ear canal volume with no middle ear pressure peak or tympanic membrane compliance  Pure tone Audiometry: Right ear- Normal hearing from [phone].     Left ear-  Normal to moderate conductive hearing loss from [phone].    Speech Audiometry: Right ear- Speech Reception Threshold (SRT) was obtained at 5 dBHL. Left ear-Speech Reception Threshold (SRT) was obtained at 15 dBHL.   Word Recognition Score Tested using NU-6 (MLV) Right ear: 100% was obtained at a presentation level of 50 dBHL with contralateral masking which is deemed as  excellent. Left ear: 100% was obtained at a presentation level of 60 dBHL with contralateral masking  which is deemed as  excellent.   The hearing test results were completed under headphones and results are deemed to be of good reliability. Test technique:  conventional    Recommendations: Follow up with ENT as scheduled for today. Repeat audiogram after medical care.   Quinta Eimer MARIE LEROUX-MARTINEZ, AUD

## 2023-05-20 NOTE — Patient Instructions (Addendum)
 Take prednisone 20mg  tablet for 6 days Take Augmentin 875 mg by mouth (PO) twice daily for 7 days; take with food, take probiotic or yogurt with it

## 2023-05-20 NOTE — Telephone Encounter (Signed)
 double ear infection pt is pregnant, per Misty Stanley K to call and sch pt, pt agreed, I told pt im unsure what her Mychart will say but I gave her the [address] address 2x 05-20-23

## 2023-05-20 NOTE — Progress Notes (Signed)
 Dear Dr. Aleen Campi, Here is my assessment for our mutual patient, Laurie Reese. Thank you for allowing me the opportunity to care for your patient. Please do not hesitate to contact me should you have any other questions. Sincerely, Dr. Jovita Kussmaul  Otolaryngology Clinic Note Referring provider: Dr. Aleen Campi HPI:  Laurie Reese is a 38 y.o. female kindly referred by Dr. Aleen Campi for evaluation of left ear complaints.  Initial visit (05/2023): Patient reports: she reports chronic left ear trouble for several years including multiple infections per year. She reports that she has had ear infections as a child, and had tympanostomy tubes. She did well afterwards, and then started to have issues starting around 2011 when she had acute onset left ear pain and bleeding without antecedent event. She then saw Dr. Jena Gauss, who reported that she had a TM perforation and repaired it 2012. After that, she reports that she did not have issues for a few years, and then started to have recurrent infections starting in 2019 (2-3/year) and then 4-5/year over past few years. Since pregnancy, she has had 3 exacerbations  She denies any nasal symptoms or infections precipitated by URI but her symptoms include left ear pressure, some postauricular pressure and feeling of fullness. Intermittent drainage/wetness. She does have to be careful with water because it seems to cause the infections. Never really has any pain. She frequently gets treated with antibiotics, and they do help though. Reports has never really tried oral steroids.   Last abx was in in Feb (early).  Sx restarted as above about 5 days ago.   Patient denies: vertigo, drainage, tinnitus Patient additionally denies: deep pain in ear canal, eustachian tube symptoms such as popping/crackling, sensitivity to pressure changes Patient also denies barotrauma, vestibular suppressant use, ototoxic medication use Prior ear surgery: Tympanoplasty (Dr. Jena Gauss -  [facility]); BTT  She is currently [redacted] weeks pregnant.  H&N Surgery: see above; Personal or FHx of bleeding dz or anesthesia difficulty: no  GLP-1: no AP/AC: no  PMHx: Healthy  Tobacco: no. Occupation: Human resources officer  Independent Review of Additional Tests or Records:  Dr. Aleen Campi (04/20/2023) FM: Noted chronic ear infection issues; still with fullness; Dx: Chronic fullness, Rx: ref to ENT Aquilla Hacker ([facility]) 03/29/2023: noted left ear concerns, pressure but no pain; no URI; recent Omnicef; prior tried cortisporin, amox and omnicef with improvement; h/o rxn to ciprodex. Dx: left ear fullness, no infection Aquilla Hacker 08/11/2021: left ear pain, muffled hearing, drainage; got oral abx; Dx: cerumen and granulation at TM surface; Dx: granulation in ear? Rx: Cortisporin Community education officer ([facility]) Audio 11/30/2022:   Labs: CMP 04/20/2023 and CBC: BUN/Cr 11/0.62; WBC 8.2, Plt 266  05/2023 Audiogram was independently reviewed and interpreted by me and it reveals - normal hearing AD with 100% WRT at 50dB with A tymp; AS with mild/mod CHL with B tymp, 100% WRT at 60dB   SNHL= Sensorineural hearing loss  PMH/Meds/All/SocHx/FamHx/ROS:   Past Medical History:  Diagnosis Date   HPV vaccine counseling    Gardasil series completed ...    HSV-1 infection    Maternal anemia, with delivery    Migraine    Postpartum thyroiditis 06/07/2020   Reactive airway disease    rare flare up     Past Surgical History:  Procedure Laterality Date   MOUTH SURGERY     TYMPANOPLASTY     TYMPANOSTOMY TUBE PLACEMENT     WISDOM TOOTH EXTRACTION      Family History  Problem Relation  Age of Onset   Hypertension Mother    Diabetes Mother    Diabetes Father    Crohn's disease Sister    Cancer Maternal Aunt        breast   Heart disease Maternal Grandmother    Cancer Maternal Grandfather        prostate   Diabetes Maternal Grandfather    Hypertension Maternal Grandfather    Heart  disease Maternal Grandfather    Prostate cancer Maternal Grandfather    Colon cancer Neg Hx      Social Connections: Not on file      Current Outpatient Medications:    amoxicillin-clavulanate (AUGMENTIN) 875-125 MG tablet, Take 1 tablet by mouth 2 (two) times daily for 7 days., Disp: 14 tablet, Rfl: 0   ondansetron (ZOFRAN-ODT) 4 MG disintegrating tablet, Take 1 tablet (4 mg total) by mouth 2 (two) times daily as needed., Disp: 24 tablet, Rfl: 0   predniSONE (DELTASONE) 20 MG tablet, Take 1 tablet (20 mg total) by mouth daily with breakfast., Disp: 6 tablet, Rfl: 0   Prenatal Vit-Fe Fumarate-FA (PRENATAL MULTIVITAMIN) TABS tablet, Take 1 tablet by mouth daily at 12 noon., Disp: , Rfl:    terbinafine (LAMISIL AT) 1 % cream, Apply 1 Application topically 2 (two) times daily., Disp: 30 g, Rfl: 0   Physical Exam:   BP 135/86 (BP Location: Left Arm, Patient Position: Sitting, Cuff Size: Large)   Pulse (!) 107   Ht 5\' 7"  (1.702 m)   Wt 252 lb (114.3 kg)   SpO2 92%   BMI 39.47 kg/m   Salient findings:  CN II-XII intact Given history and complaints, ear microscopy was indicated and performed for evaluation with findings as below in physical exam section and in procedures  Left: EAC clear and TM intact with well pneumatized middle ear space Right: postauricular scar well healed; no eczematoid change of canal; posterior what apears to be cartilage graft, and anterior myringitis with slight wetness but no purulence; pars flaccida mild retractionand anterosuperiorly ear seems aerated but TM thickened; CSF powder placed in canal Weber 512: left Rinne 512: AC > BC b/l  Anterior rhinoscopy: Septum relatively midline; bilateral inferior turbinates without significant hypertrophy No lesions of oral cavity/oropharynx No obviously palpable neck masses/lymphadenopathy/thyromegaly No respiratory distress or stridor  Seprately Identifiable Procedures:  Procedure: Bilateral ear microscopy using  microscope (CPT 92504) Pre-procedure diagnosis: left ear fullness, h/o tympanoplasty Post-procedure diagnosis: same Indication: see above; given patient's otologic complaints and history, for improved and comprehensive examination of external ear and tympanic membrane, bilateral otologic examination using microscope was performed  Procedure: Patient was placed semi-recumbent. Both ear canals were examined using the microscope with findings above. CSF powder placed in canal Patient tolerated the procedure well.   Impression & Plans:  Laurie Reese is a 38 y.o. female with:  1. Conductive hearing loss of left ear with unrestricted hearing of right ear   2. Myringitis of left ear   3. History of tympanoplasty   4. Sensation of fullness in left ear    Noted myringitis and pressure could be from ETD and effusion; we discussed options, and I would recommend rx for ETD and myringitis - will do short course of pred (discussed risks including pre-term birth) and augmentin BID x7d Keep ear dry Does not do well with ciprodex, so CSF powder placed We discussed chronic nature of this, and will likely need CT after she has the baby F/u 3 weeks  See below regarding exact  medications prescribed this encounter including dosages and route: Meds ordered this encounter  Medications   predniSONE (DELTASONE) 20 MG tablet    Sig: Take 1 tablet (20 mg total) by mouth daily with breakfast.    Dispense:  6 tablet    Refill:  0   amoxicillin-clavulanate (AUGMENTIN) 875-125 MG tablet    Sig: Take 1 tablet by mouth 2 (two) times daily for 7 days.    Dispense:  14 tablet    Refill:  0      Thank you for allowing me the opportunity to care for your patient. Please do not hesitate to contact me should you have any other questions.  Sincerely, Jovita Kussmaul, MD Otolaryngologist (ENT), [facility] Phone: [phone] Fax: [phone]  05/20/2023, 1:59 PM   MDM:  Level 4 -  [phone] Complexity/Problems addressed: mod - multiple chronic problems Data complexity: mod - independent review of notes, labs, ordering tests - Morbidity: mod  - Prescription Drug prescribed or managed: yes

## 2023-05-21 ENCOUNTER — Other Ambulatory Visit (HOSPITAL_COMMUNITY): Payer: Self-pay

## 2023-05-21 NOTE — Progress Notes (Signed)
 Please abstract Pap smear

## 2023-05-27 ENCOUNTER — Telehealth

## 2023-05-27 DIAGNOSIS — N760 Acute vaginitis: Secondary | ICD-10-CM

## 2023-05-28 MED ORDER — TERCONAZOLE 0.4 % VA CREA: 1.0000 | TOPICAL_CREAM | Freq: Every day | VAGINAL | 0 refills | Status: AC

## 2023-05-28 MED ORDER — MICONAZOLE 7 100 MG VA SUPP: 100.0000 mg | Freq: Every day | VAGINAL | 0 refills | Status: DC

## 2023-05-28 NOTE — Progress Notes (Signed)

## 2023-05-28 NOTE — Addendum Note (Signed)
 Addended by: Karrie Meres on: 05/28/2023 02:04 PM   Modules accepted: Orders

## 2023-06-15 ENCOUNTER — Encounter (INDEPENDENT_AMBULATORY_CARE_PROVIDER_SITE_OTHER): Payer: Self-pay | Admitting: Otolaryngology

## 2023-06-15 ENCOUNTER — Ambulatory Visit (INDEPENDENT_AMBULATORY_CARE_PROVIDER_SITE_OTHER): Admitting: Otolaryngology

## 2023-06-15 VITALS — BP 151/79 | HR 96 | Ht 67.0 in | Wt 254.0 lb

## 2023-06-15 DIAGNOSIS — H938X2 Other specified disorders of left ear: Secondary | ICD-10-CM

## 2023-06-15 DIAGNOSIS — H6992 Unspecified Eustachian tube disorder, left ear: Secondary | ICD-10-CM

## 2023-06-15 DIAGNOSIS — Z9889 Other specified postprocedural states: Secondary | ICD-10-CM

## 2023-06-15 DIAGNOSIS — H9012 Conductive hearing loss, unilateral, left ear, with unrestricted hearing on the contralateral side: Secondary | ICD-10-CM | POA: Diagnosis not present

## 2023-06-15 DIAGNOSIS — H7322 Unspecified myringitis, left ear: Secondary | ICD-10-CM

## 2023-06-15 NOTE — Progress Notes (Signed)
 Dear Dr. Aleen Campi, Here is my assessment for our mutual patient, Laurie Reese. Thank you for allowing me the opportunity to care for your patient. Please do not hesitate to contact me should you have any other questions. Sincerely, Dr. Jovita Kussmaul  Otolaryngology Clinic Note Referring provider: Dr. Aleen Campi HPI:  Laurie Reese is a 38 y.o. female kindly referred by Dr. Aleen Campi for evaluation of left ear complaints.  Initial visit (05/2023): Patient reports: she reports chronic left ear trouble for several years including multiple infections per year. She reports that she has had ear infections as a child, and had tympanostomy tubes. She did well afterwards, and then started to have issues starting around 2011 when she had acute onset left ear pain and bleeding without antecedent event. She then saw Dr. Jena Gauss, who reported that she had a TM perforation and repaired it 2012. After that, she reports that she did not have issues for a few years, and then started to have recurrent infections starting in 2019 (2-3/year) and then 4-5/year over past few years. Since pregnancy, she has had 3 exacerbations  She denies any nasal symptoms or infections precipitated by URI but her symptoms include left ear pressure, some postauricular pressure and feeling of fullness. Intermittent drainage/wetness. She does have to be careful with water because it seems to cause the infections. Never really has any pain. She frequently gets treated with antibiotics, and they do help though. Reports has never really tried oral steroids.   Last abx was in in Feb (early).  Sx restarted as above about 5 days ago.   Patient denies: vertigo, drainage, tinnitus Patient additionally denies: deep pain in ear canal, eustachian tube symptoms such as popping/crackling, sensitivity to pressure changes Patient also denies barotrauma, vestibular suppressant use, ototoxic medication use Prior ear surgery: Tympanoplasty (Dr. Jena Gauss -  [facility]); BTT  --------------------------------------------------------- 06/15/2023 Returns for follow up. Left ear doing much better after CSF powder; but now starting to have some trouble again with discomfort but not bad. No drainage currently, hearing change, vertigo.    She is currently [redacted] weeks pregnant.  H&N Surgery: see above; Personal or FHx of bleeding dz or anesthesia difficulty: no  GLP-1: no AP/AC: no  PMHx: Healthy  Tobacco: no. Occupation: Human resources officer  Independent Review of Additional Tests or Records:  Dr. Aleen Campi (04/20/2023) FM: Noted chronic ear infection issues; still with fullness; Dx: Chronic fullness, Rx: ref to ENT Aquilla Hacker ([facility]) 03/29/2023: noted left ear concerns, pressure but no pain; no URI; recent Omnicef; prior tried cortisporin, amox and omnicef with improvement; h/o rxn to ciprodex. Dx: left ear fullness, no infection Aquilla Hacker 08/11/2021: left ear pain, muffled hearing, drainage; got oral abx; Dx: cerumen and granulation at TM surface; Dx: granulation in ear? Rx: Cortisporin Community education officer ([facility]) Audio 11/30/2022:   Labs: CMP 04/20/2023 and CBC: BUN/Cr 11/0.62; WBC 8.2, Plt 266  05/2023 Audiogram was independently reviewed and interpreted by me and it reveals - normal hearing AD with 100% WRT at 50dB with A tymp; AS with mild/mod CHL with B tymp, 100% WRT at 60dB   SNHL= Sensorineural hearing loss  PMH/Meds/All/SocHx/FamHx/ROS:   Past Medical History:  Diagnosis Date   HPV vaccine counseling    Gardasil series completed ...    HSV-1 infection    Maternal anemia, with delivery    Migraine    Postpartum thyroiditis 06/07/2020   Reactive airway disease    rare flare up     Past Surgical History:  Procedure Laterality Date   MOUTH SURGERY     TYMPANOPLASTY     TYMPANOSTOMY TUBE PLACEMENT     WISDOM TOOTH EXTRACTION      Family History  Problem Relation Age of Onset   Hypertension Mother    Diabetes  Mother    Diabetes Father    Crohn's disease Sister    Cancer Maternal Aunt        breast   Heart disease Maternal Grandmother    Cancer Maternal Grandfather        prostate   Diabetes Maternal Grandfather    Hypertension Maternal Grandfather    Heart disease Maternal Grandfather    Prostate cancer Maternal Grandfather    Colon cancer Neg Hx      Social Connections: Not on file      Current Outpatient Medications:    ondansetron (ZOFRAN-ODT) 4 MG disintegrating tablet, Take 1 tablet (4 mg total) by mouth 2 (two) times daily as needed., Disp: 24 tablet, Rfl: 0   predniSONE (DELTASONE) 20 MG tablet, Take 1 tablet (20 mg total) by mouth daily with breakfast., Disp: 6 tablet, Rfl: 0   Prenatal Vit-Fe Fumarate-FA (PRENATAL MULTIVITAMIN) TABS tablet, Take 1 tablet by mouth daily at 12 noon., Disp: , Rfl:    terbinafine (LAMISIL AT) 1 % cream, Apply topically 2 (two) times daily., Disp: 30 g, Rfl: 0   Physical Exam:   BP (!) 151/79 (BP Location: Left Wrist, Patient Position: Sitting, Cuff Size: Normal)   Pulse 96   Ht 5\' 7"  (1.702 m)   Wt 254 lb (115.2 kg)   BMI 39.78 kg/m   Salient findings:  CN II-XII intact Given history and complaints, ear microscopy was indicated and performed for evaluation with findings as below in physical exam section and in procedures  Right: EAC clear and TM intact with well pneumatized middle ear space Left: postauricular scar well healed; no eczematoid change of canal; posterior what apears to be cartilage graft, and anterior myringitis/bulge with slight wetness but no purulence; pars flaccida is clearly retracted but no obvious keratin debris; CSF powder again placed in canal Weber 512: left Rinne 512: AC > BC b/l  Seprately Identifiable Procedures:  Procedure: Bilateral ear microscopy using microscope (CPT [id number]) Pre-procedure diagnosis: left ear fullness, h/o tympanoplasty Post-procedure diagnosis: same Indication: see above; given patient's  otologic complaints and history, for improved and comprehensive examination of external ear and tympanic membrane, bilateral otologic examination using microscope was performed  Procedure: Patient was placed semi-recumbent. Both ear canals were examined using the microscope with findings above. CSF powder placed in canal Patient tolerated the procedure well.   Impression & Plans:  Laurie Reese is a 38 y.o. female with:  1. Dysfunction of left eustachian tube   2. Conductive hearing loss of left ear with unrestricted hearing of right ear   3. Myringitis of left ear   4. Sensation of fullness in left ear   5. History of tympanoplasty    Noted myringitis and pressure could be from ETD/mastoid effusion; she also has a anteroinferior bulge (cartilage?) and some clear pars flaccida retraction.   we discussed options, and I would recommend continued rx for myringitis and to keep ear dry Does not do well with ciprodex, will order her CSF powder - use once or twice weekly two puffs left ear We discussed chronic nature of this, and will likely need CT after she has the baby F/u 10 weeks (few weeks before delivery), sooner if any other  issues  See below regarding exact medications prescribed this encounter including dosages and route: CSF powder (ordered from compounding pharmacy in [place])     Thank you for allowing me the opportunity to care for your patient. Please do not hesitate to contact me should you have any other questions.  Sincerely, Milon Aloe, MD Otolaryngologist (ENT), [facility] Phone: [phone] Fax: [phone]  06/15/2023, 1:30 PM   MDM:  Level 4 - 99214 Complexity/Problems addressed: mod - multiple chronic problems Data complexity: low - Morbidity: mod  - Prescription Drug prescribed or managed: yes - csf powder

## 2023-06-16 ENCOUNTER — Institutional Professional Consult (permissible substitution) (INDEPENDENT_AMBULATORY_CARE_PROVIDER_SITE_OTHER): Payer: Self-pay

## 2023-06-16 ENCOUNTER — Ambulatory Visit (INDEPENDENT_AMBULATORY_CARE_PROVIDER_SITE_OTHER): Payer: Self-pay | Admitting: Audiology

## 2023-06-17 DIAGNOSIS — Z3482 Encounter for supervision of other normal pregnancy, second trimester: Secondary | ICD-10-CM | POA: Diagnosis not present

## 2023-06-17 DIAGNOSIS — Z3483 Encounter for supervision of other normal pregnancy, third trimester: Secondary | ICD-10-CM | POA: Diagnosis not present

## 2023-06-30 DIAGNOSIS — O09522 Supervision of elderly multigravida, second trimester: Secondary | ICD-10-CM | POA: Diagnosis not present

## 2023-06-30 DIAGNOSIS — Z3A27 27 weeks gestation of pregnancy: Secondary | ICD-10-CM | POA: Diagnosis not present

## 2023-06-30 DIAGNOSIS — O4440 Low lying placenta NOS or without hemorrhage, unspecified trimester: Secondary | ICD-10-CM | POA: Diagnosis not present

## 2023-06-30 DIAGNOSIS — Z3689 Encounter for other specified antenatal screening: Secondary | ICD-10-CM | POA: Diagnosis not present

## 2023-07-07 ENCOUNTER — Other Ambulatory Visit (HOSPITAL_COMMUNITY): Payer: Self-pay

## 2023-07-07 DIAGNOSIS — Z3A28 28 weeks gestation of pregnancy: Secondary | ICD-10-CM | POA: Diagnosis not present

## 2023-07-07 DIAGNOSIS — O09523 Supervision of elderly multigravida, third trimester: Secondary | ICD-10-CM | POA: Diagnosis not present

## 2023-07-07 DIAGNOSIS — O99810 Abnormal glucose complicating pregnancy: Secondary | ICD-10-CM | POA: Diagnosis not present

## 2023-07-07 DIAGNOSIS — Z3689 Encounter for other specified antenatal screening: Secondary | ICD-10-CM | POA: Diagnosis not present

## 2023-07-07 MED ORDER — ACCU-CHEK SOFTCLIX LANCET DEV KIT: PACK | 0 refills | Status: DC

## 2023-07-07 MED ORDER — FREESTYLE LITE W/DEVICE KIT
PACK | 0 refills | Status: DC
  Filled 2023-07-07: qty 1, 30d supply, fill #0

## 2023-07-07 MED ORDER — FREESTYLE LANCETS MISC
2 refills | Status: DC
  Filled 2023-07-07: qty 100, 25d supply, fill #0

## 2023-07-07 MED ORDER — ACCU-CHEK GUIDE TEST VI STRP
ORAL_STRIP | 2 refills | Status: DC
  Filled 2023-07-07: qty 100, 25d supply, fill #0

## 2023-07-08 ENCOUNTER — Other Ambulatory Visit (HOSPITAL_COMMUNITY): Payer: Self-pay

## 2023-07-12 ENCOUNTER — Other Ambulatory Visit: Payer: Self-pay

## 2023-07-12 DIAGNOSIS — E639 Nutritional deficiency, unspecified: Secondary | ICD-10-CM | POA: Insufficient documentation

## 2023-07-13 ENCOUNTER — Encounter: Attending: Obstetrics and Gynecology | Admitting: Dietician

## 2023-07-13 ENCOUNTER — Other Ambulatory Visit: Payer: Self-pay | Admitting: Medical

## 2023-07-13 ENCOUNTER — Other Ambulatory Visit: Payer: Self-pay

## 2023-07-13 ENCOUNTER — Ambulatory Visit: Admitting: Dietician

## 2023-07-13 ENCOUNTER — Other Ambulatory Visit (HOSPITAL_COMMUNITY): Payer: Self-pay

## 2023-07-13 DIAGNOSIS — L0591 Pilonidal cyst without abscess: Secondary | ICD-10-CM | POA: Insufficient documentation

## 2023-07-13 DIAGNOSIS — Z792 Long term (current) use of antibiotics: Secondary | ICD-10-CM | POA: Diagnosis not present

## 2023-07-13 DIAGNOSIS — Z3A31 31 weeks gestation of pregnancy: Secondary | ICD-10-CM | POA: Insufficient documentation

## 2023-07-13 DIAGNOSIS — O99713 Diseases of the skin and subcutaneous tissue complicating pregnancy, third trimester: Secondary | ICD-10-CM | POA: Insufficient documentation

## 2023-07-13 DIAGNOSIS — Z3A29 29 weeks gestation of pregnancy: Secondary | ICD-10-CM

## 2023-07-13 DIAGNOSIS — O24419 Gestational diabetes mellitus in pregnancy, unspecified control: Secondary | ICD-10-CM

## 2023-07-13 DIAGNOSIS — L03317 Cellulitis of buttock: Secondary | ICD-10-CM

## 2023-07-13 MED ORDER — CEFPODOXIME PROXETIL 200 MG PO TABS
200.0000 mg | ORAL_TABLET | Freq: Two times a day (BID) | ORAL | 0 refills | Status: DC
  Filled 2023-07-13: qty 14, 7d supply, fill #0

## 2023-07-13 NOTE — Progress Notes (Signed)
 Patient was seen for Gestational Diabetes on 07/13/23  Start time 0815 and End time 0900   Estimated due date: 09/27/2023; [redacted]w[redacted]d   Clinical: Medications: Prenatal vitamin Medical History: GDM (2025) Labs: OGTT fasting 89, 1 hour 209, 2 hour 193, 3 hour 62 on 07/07/2023, A1c 5.5% on 03/24/2022    Dietary and Lifestyle History: Patient lives with her husband and daughter.  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  Physical Activity: walking daily as a course of her day Stress: minimum Sleep: fair (increased trips to the bathroom)  24 hr Recall:  First Meal:  bagel, low fat cream cheese Snack:  peanut butter with ritz crackers OR orange Second meal:  leftover spaghetti with meat (venison) sauce Snack:  goldfish, apple   Third meal:  chicken nuggets, fries Snack:  none Beverages:  water  NUTRITION INTERVENTION  Nutrition education (E-1) on the following topics:   Initial Follow-up  [x]  []  Definition of Gestational Diabetes [x]  []  Why dietary management is important in controlling blood glucose [x]  []  Effects each nutrient has on blood glucose levels [x]  []  Simple carbohydrates vs complex carbohydrates [x]  []  Fluid intake [x]  []  Creating a balanced meal plan []  []  Carbohydrate counting  [x]  []  When to check blood glucose levels [x]  []  Proper blood glucose monitoring techniques [x]  []  Effect of stress and stress reduction techniques  [x]  []  Exercise effect on blood glucose levels, appropriate exercise during pregnancy [x]  []  Importance of limiting caffeine and abstaining from alcohol and smoking [x]  []  Medications used for blood sugar control during pregnancy [x]  []  Hypoglycemia and rule of 15 [x]  []  Postpartum self care  Free Style blood glucose meter.   Patient has a meter prior to visit. Patient is testing pre breakfast and 2 hours after each meal. FBS: 82 Postprandial: 109-116 with occasional 132  Patient instructed to monitor glucose  levels: FBS: 60 - <= 95 mg/dL; 2 hour: <= 161 mg/dL  Patient received handouts: Nutrition Diabetes and Pregnancy Snack ideas for diabetes during pregnancy

## 2023-07-27 ENCOUNTER — Encounter: Payer: Self-pay | Admitting: Audiology

## 2023-07-28 ENCOUNTER — Ambulatory Visit: Admitting: Medical

## 2023-07-28 VITALS — BP 110/78 | HR 99 | Temp 98.2°F | Wt 261.0 lb

## 2023-07-28 DIAGNOSIS — Z792 Long term (current) use of antibiotics: Secondary | ICD-10-CM

## 2023-07-28 DIAGNOSIS — Z3A31 31 weeks gestation of pregnancy: Secondary | ICD-10-CM

## 2023-07-28 DIAGNOSIS — L0591 Pilonidal cyst without abscess: Secondary | ICD-10-CM | POA: Diagnosis not present

## 2023-07-28 NOTE — Addendum Note (Signed)
 Addended by: Charliene Conte A on: 07/28/2023 05:12 PM   Modules accepted: Orders

## 2023-07-28 NOTE — Progress Notes (Signed)
 Placed both referrals urgently.  Patient is not sure she wants another culture done since she didn't have any growth the first time. She is agreeable to consults and possible I and D

## 2023-07-28 NOTE — Telephone Encounter (Signed)
 Jimmye Moulds saw this already at appt today

## 2023-07-28 NOTE — Progress Notes (Signed)
 Subjective: Chief Complaint  Patient presents with   Acute Visit    Abscess at top of buttocks. Recently on medication for it but feels like it is coming back   Here for ongoing issues with buttock knot/possible infection.    She was seen 04/12/23 for same area that was abscessed requiring I&D that visit here with [facility] NP.   After the 04/2023 I&D, and antibiotic that area resolved.  Has been ok with this area of buttocks until last few weeks.  As of today she has completed 1 round of Cefpodoxime .    However given recurrent ear infections, she has been on at least 5 rounds of antibiotic in recent months including Amoxicillin , Augmentin , keflex , and Cefpodoxime .  Has had to use some yeast infection medication, 3 day vaginal cream for yeast infection due to antibiotic use.  Is [redacted] weeks pregnant.  Recent HgbA1C was 5.5%.   glucose at home runs ok, but was abnormal on 1 hour and 3 hour GTT through gyn.    Started Monday 3 days ago, has worse knot feeling in left buttock and some discoloration.   Has discomfort sitting prolonged.     No fever, no chills, no body aches.   No other aggravating or relieving factors. No other complaint.   Past Medical History:  Diagnosis Date   HPV vaccine counseling    Gardasil series completed ...    HSV-1 infection    Maternal anemia, with delivery    Migraine    Postpartum thyroiditis 06/07/2020   Reactive airway disease    rare flare up   Current Outpatient Medications on File Prior to Visit  Medication Sig Dispense Refill   Blood Glucose Monitoring Suppl (FREESTYLE LITE) w/Device KIT Use as directed to test blood sugar 4 times daily. 1 kit 0   Prenatal Vit-Fe Fumarate-FA (PRENATAL MULTIVITAMIN) TABS tablet Take 1 tablet by mouth daily at 12 noon.     Lancets (FREESTYLE) lancets Use four times daily to test blood sugar. 100 each 2   glucose blood (ACCU-CHEK GUIDE TEST) test strip Use to test blood sugar 4 times daily. 100 strip 2   Lancets Misc.  (ACCU-CHEK SOFTCLIX LANCET DEV) KIT use one lancet four times daily as directed 1 kit 0   ondansetron  (ZOFRAN -ODT) 4 MG disintegrating tablet Take 1 tablet (4 mg total) by mouth 2 (two) times daily as needed. (Patient not taking: Reported on 07/28/2023) 24 tablet 0   No current facility-administered medications on file prior to visit.    Objective: BP 110/78   Pulse 99   Temp 98.2 F (36.8 C)   Wt 261 lb (118.4 kg)   SpO2 99%   BMI 40.88 kg/m   Gen: wd, wn ,nad Skin: left buttock along superior gluteal cleft with 1.5 - 1.7 diameter cystic subcutaneous knot, but no distinct induration, fluctuance, or warmth.  Nontender to palpation.  No drainage but there is brownish discoloration.     Pic from today       Pic from 07/13/23: Media Information     Document Information  Patient Upload: Patient Entered Attachment  [id number]  07/28/2023  Attached To:  Patient Message on 07/28/23 with Mychart, Generic Provider  Source Information  Mychart, Generic  Document History      picture from 04/2023 I&D: Media Information   Document Information  Patient Upload: Patient Entered Attachment  [id number]  07/28/2023  Attached To:  Patient Message on 07/28/23 with Mychart, Generic Provider  Source Information  Mychart, Generic  Document History      Assessment: Encounter Diagnoses  Name Primary?   Pilonidal cyst Yes   [redacted] weeks gestation of pregnancy    Antibiotic long-term use     Plan: We discussed the findings, concerns.  She did not want to attempt I&D today and the lesion is not near as bad as when she came in 04/2023  I am concerened about frequent use of antibiocs in recent months, pregnancy, and current exam findings that may worsen.  In meantime advised warm compresses, avoid prolonged sitting at work.  I will reach out to general surgery and infectious disease for their thoughts on this.   She has hx/o hidradenitis issues as well.   F/u pending  call back

## 2023-07-29 ENCOUNTER — Other Ambulatory Visit: Payer: Self-pay | Admitting: Medical

## 2023-07-29 ENCOUNTER — Other Ambulatory Visit (HOSPITAL_COMMUNITY): Payer: Self-pay

## 2023-07-29 ENCOUNTER — Ambulatory Visit: Admitting: Family Medicine

## 2023-07-29 DIAGNOSIS — L0501 Pilonidal cyst with abscess: Secondary | ICD-10-CM | POA: Diagnosis not present

## 2023-07-29 DIAGNOSIS — L0231 Cutaneous abscess of buttock: Secondary | ICD-10-CM

## 2023-07-29 DIAGNOSIS — Z3A31 31 weeks gestation of pregnancy: Secondary | ICD-10-CM

## 2023-07-29 DIAGNOSIS — K610 Anal abscess: Secondary | ICD-10-CM

## 2023-07-29 MED ORDER — CEFPODOXIME PROXETIL 200 MG PO TABS
200.0000 mg | ORAL_TABLET | Freq: Two times a day (BID) | ORAL | 0 refills | Status: DC
  Filled 2023-07-29: qty 14, 7d supply, fill #0

## 2023-07-29 MED ORDER — TERCONAZOLE 0.8 % VA CREA
1.0000 | TOPICAL_CREAM | Freq: Every day | VAGINAL | 0 refills | Status: DC
  Filled 2023-07-29: qty 20, 3d supply, fill #0

## 2023-07-29 NOTE — Progress Notes (Signed)
   Subjective:    Patient ID: Laurie Reese, female    DOB: [birth year], 38 y.o.   MRN: [id number]  HPI She is here for reevaluation of a pilonidal abscess.  The first 1 occurred over 3 months ago.  An I&D was performed at that point.  Since then the second 1 occurred 2 weeks ago and antibiotics were given which did quieted down but then it had a reoccurrence of it 3 days ago.   Review of Systems     Objective:    Physical Exam Exam of the gluteal crease does show an area of slight erythema and 3 cm of induration with a central dimple.       Assessment & Plan:  Pilonidal abscess The lesion was injected with Xylocaine  and epinephrine.  A 2 cm incision was made.  Minimal amount of exudate was expressed without difficulty.  A central abscess was identified and sponges were used to clean the area.  It was packed with iodoform.  She will start on the antibiotic which she has been taken in the past.  Apparently the antibiotic is okay to get to a pregnant person. She is scheduled to see general surgery Monday.  Since this is a reoccurrence, further more definitive care I think is appropriate.

## 2023-07-29 NOTE — Progress Notes (Signed)
 Patient would like to know if you could send in a rx for antibiotics just incase it gets worse and she can't be seen until next week

## 2023-07-29 NOTE — Progress Notes (Signed)
 General surgery or infectious disease can't get her in anytime soon. First available for infectious disease is 08/10/23 and that's on cancellation list. Surgery center is booked out further.  Providers are out and so schedules is limited. Can you call in Terconazole  (Terazol 3 ) 0.8% vaginal cream for patient as well incase it is needed.

## 2023-07-29 NOTE — Progress Notes (Signed)
 Working on getting her in today if possible for an appointment verses next week

## 2023-07-29 NOTE — Addendum Note (Signed)
 Addended by: Charliene Conte A on: 07/29/2023 04:16 PM   Modules accepted: Orders

## 2023-07-29 NOTE — Progress Notes (Signed)
 Patient notified me that [facility] called her as has an opening at 8:45am on monday

## 2023-08-02 ENCOUNTER — Ambulatory Visit: Admitting: Surgery

## 2023-08-02 ENCOUNTER — Encounter: Payer: Self-pay | Admitting: Surgery

## 2023-08-02 VITALS — BP 123/81 | HR 103 | Temp 98.7°F | Ht 67.0 in | Wt 259.0 lb

## 2023-08-02 DIAGNOSIS — L0591 Pilonidal cyst without abscess: Secondary | ICD-10-CM | POA: Diagnosis not present

## 2023-08-02 NOTE — Patient Instructions (Addendum)
 Continue your antibiotics until finished. Keep the area covered with a clean dry Band-Aid. When showering remove your Band-Aid first, let the warm soapy water run over the area, rinse well, and when done pat dry well and cover with a new Band-Aid.   Follow-up with our office in 3 months.  Please call and ask to speak with a nurse if you develop questions or concerns.   Pilonidal Cyst  A pilonidal cyst is a fluid-filled sac that forms under the skin near the tailbone, at the top of the crease of the buttocks (pilonidal area). Cysts that are small and not infected may not cause any problems. Cysts that become irritated or infected may grow and fill with pus. An infected cyst is called an abscess. A pilonidal abscess may cause pain and swelling. It may need to be drained or removed. What are the causes? The cause of this condition is not always known. In some cases, it may be caused by a hair that grows into your skin (ingrown hair). What increases the risk? You are more likely to develop this condition if: You are female. You have lots of hair near the crease of the buttocks. You are overweight. You have a dimple near the crease of the buttocks. You wear tight clothing. You do not bathe or shower often. You sit for long periods of time. What are the signs or symptoms? Symptoms of this condition may include pain, swelling, redness, and warmth in the pilonidal area. You may also be able to feel a lump near your tailbone if your cyst is big. If your cyst becomes infected, symptoms may include: Pus or fluid drainage. Fever. Pain, swelling, and redness getting worse. The lump getting bigger. How is this diagnosed? This condition may be diagnosed based on: Your symptoms and medical history. A physical exam. A blood test to check for infection. A test of a pus sample. How is this treated? You may not need any treatment if your cyst does not cause symptoms. If your cyst bothers you or is  infected, you may need a procedure to drain or remove the cyst. Depending on the size, location, and severity of your cyst, your health care provider may: Make an incision in the cyst and drain it (incision anddrainage). Open and drain the cyst, and then stitch the wound so that it stays open while it heals (marsupialization). You will be given instructions about how to care for your open wound while it heals. Remove all or part of the cyst, and then close the wound (cyst removal). You may need to take antibiotics before your procedure. Follow these instructions at home: Medicines Take over-the-counter and prescription medicines only as told by your health care provider. If you were prescribed antibiotics, take them as told by your health care provider. Do not stop taking the antibiotic even if you start to feel better. General instructions Keep the area around the cyst clean and dry. If there is fluid or pus draining from your cyst: Cover the area with a clean bandage (dressing). Wash the area gently with soap and water. Pat the area dry with a clean towel. Do not rub the area because that may cause bleeding. Remove hair from the area around the cyst only if your health care provider tells you to. Do not wear tight pants or sit in one position for long periods at a time. Contact a health care provider if: You have new redness, swelling, or pain. You have a fever. You  have severe pain. Summary A pilonidal cyst is a fluid-filled sac that forms under the skin near the tailbone, at the top of the crease of the buttocks (pilonidal area). Cysts that become irritated or infected may grow and fill with pus. An infected cyst is called an abscess. The cause of this condition is not always known. In some cases, it may be caused by a hair that grows into your skin (ingrown hair). You may not need any treatment if your cyst does not cause symptoms. If your cyst bothers you or is infected, you may need a  procedure to drain or remove the cyst. This information is not intended to replace advice given to you by your health care provider. Make sure you discuss any questions you have with your health care provider. Document Revised: 05/14/2021 Document Reviewed: 05/14/2021 Elsevier Patient Education  2024 [employer].

## 2023-08-02 NOTE — Progress Notes (Signed)
 Patient ID: Laurie Reese, female   DOB: [birth year], 38 y.o.   MRN: [id number]  HPI Laurie Reese is a 38 y.o. female seen in consultation at the request of Mr.Tsynger.  She reports that she had recurrent pilonidal disease this is the second time.  Last week she had an I&D on her pilonidal cyst.  She has been taking antibiotics.  She endorses intermittent pain mild and sharp worsening when she applies pressure.  No fevers no chills.  She is [redacted] weeks pregnant, she developed gestational diabetes but is not on any antiglycemic agents. She is able to perform more than 4 METS of activities without any shortness of breath or chest pain.  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    HPI  Past Medical History:  Diagnosis Date   HPV vaccine counseling    Gardasil series completed ...    HSV-1 infection    Maternal anemia, with delivery    Migraine    Postpartum thyroiditis 06/07/2020   Reactive airway disease    rare flare up    Past Surgical History:  Procedure Laterality Date   MOUTH SURGERY     TYMPANOPLASTY     TYMPANOSTOMY TUBE PLACEMENT     WISDOM TOOTH EXTRACTION      Family History  Problem Relation Age of Onset   Hypertension Mother    Diabetes Mother    Diabetes Father    Crohn's disease Sister    Cancer Maternal Aunt        breast   Heart disease Maternal Grandmother    Cancer Maternal Grandfather        prostate   Diabetes Maternal Grandfather    Hypertension Maternal Grandfather    Heart disease Maternal Grandfather    Prostate cancer Maternal Grandfather    Colon cancer Neg Hx     Social History Social History   Tobacco Use   Smoking status: Never   Smokeless tobacco: Never  Vaping Use   Vaping status: Never Used  Substance Use Topics   Alcohol use: Not Currently    Alcohol/week: 0.0 standard drinks of alcohol   Drug use: No    Allergies  Allergen Reactions   Celexa [Citalopram] Swelling     Swelling on face   Ciprodex [Ciprofloxacin-Dexamethasone] Swelling    Swelling on face    Current Outpatient Medications  Medication Sig Dispense Refill   Blood Glucose Monitoring Suppl (FREESTYLE LITE) w/Device KIT Use as directed to test blood sugar 4 times daily. 1 kit 0   cefpodoxime  (VANTIN ) 200 MG tablet Take 1 tablet (200 mg total) by mouth 2 (two) times daily. (Patient not taking: Reported on 07/29/2023) 14 tablet 0   glucose blood (ACCU-CHEK GUIDE TEST) test strip Use to test blood sugar 4 times daily. 100 strip 2   Lancets (FREESTYLE) lancets Use four times daily to test blood sugar. 100 each 2   Lancets Misc. (ACCU-CHEK SOFTCLIX LANCET DEV) KIT use one lancet four times daily as directed 1 kit 0   ondansetron  (ZOFRAN -ODT) 4 MG disintegrating tablet Take 1 tablet (4 mg total) by mouth 2 (two) times daily as needed. (Patient not taking: Reported on 07/29/2023) 24 tablet 0   Prenatal Vit-Fe Fumarate-FA (PRENATAL MULTIVITAMIN) TABS tablet Take 1 tablet by mouth daily at 12 noon.     terconazole  (TERAZOL 3 ) 0.8 % vaginal cream Place 1 applicator vaginally at bedtime. (Patient not taking: Reported on 07/29/2023) 20 g  0   No current facility-administered medications for this visit.     Review of Systems Full ROS  was asked and was negative except for the information on the HPI  Physical Exam  CONSTITUTIONAL: NAD. EYES: Pupils are equal, round, and reactive to light, Sclera are non-icteric. EARS, NOSE, MOUTH AND THROAT: The oropharynx is clear. The oral mucosa is pink and moist. Hearing is intact to voice. LYMPH NODES:  Lymph nodes in the neck are normal. RESPIRATORY:  Lungs are clear. There is normal respiratory effort, with equal breath sounds bilaterally, and without pathologic use of accessory muscles. CARDIOVASCULAR: Heart is regular without murmurs, gallops, or rubs. GI: The abdomen is  soft, nontender, and nondistended. There are no palpable masses. There is no  hepatosplenomegaly. There are normal bowel sounds in all quadrants. GU: Rectal deferred.   MUSCULOSKELETAL: Normal muscle strength and tone. No cyanosis or edema.   SKIN: Turgor is good and there is evidence of a small wound to the left of the midline above the gluteal cleft.  I removed the packing and replaced dressing with large Band-Aid.  There is no evidence of necrotizing infection there is no evidence of undrained collections.  There is some induration and erythema around the wound.  Overall wound is healing well with no signs of infection NEUROLOGIC: Motor and sensation is grossly normal. Cranial nerves are grossly intact. PSYCH:  Oriented to person, place and time. Affect is normal.  Data Reviewed  I have personally reviewed the patient's imaging, laboratory findings and medical records.    Assessment/Plan 38 year old female IUP 32 weeks with pilonidal disease.  Discussed with patient in detail about her disease process.  No need for surgical intervention at this time.  Discussed with her about appropriate air removal and continuation of wound care.  Also discussed with her that her disease may recur and there is chance that that surgical intervention is required in the future..  Discussed with her that if we were to do surgical intervention I would probably do an excision and let the wound heal by secondary intention.  Discussed with her that an alternative will be a flap creation but the high chances of the flap failing prolonged given her wound healing process. At this time I do want to halt any surgical therapy.  She understands to call us  back if her disease progresses.  Please note that I spent 45 minutes in this encounter including review of medical records, counseling the pt ,  Placing orders and performing documentation    Evelia Hipp, MD FACS General Surgeon 08/02/2023, 8:45 AM

## 2023-08-03 DIAGNOSIS — Z3A32 32 weeks gestation of pregnancy: Secondary | ICD-10-CM | POA: Diagnosis not present

## 2023-08-03 DIAGNOSIS — O24410 Gestational diabetes mellitus in pregnancy, diet controlled: Secondary | ICD-10-CM | POA: Diagnosis not present

## 2023-08-03 DIAGNOSIS — O26843 Uterine size-date discrepancy, third trimester: Secondary | ICD-10-CM | POA: Diagnosis not present

## 2023-08-03 DIAGNOSIS — O09523 Supervision of elderly multigravida, third trimester: Secondary | ICD-10-CM | POA: Diagnosis not present

## 2023-08-03 LAB — WOUND CULTURE: Organism ID, Bacteria: NONE SEEN

## 2023-08-04 ENCOUNTER — Ambulatory Visit: Payer: Self-pay | Admitting: Medical

## 2023-08-04 NOTE — Progress Notes (Signed)
 Results sent through MyChart

## 2023-08-10 ENCOUNTER — Ambulatory Visit: Admitting: Infectious Diseases

## 2023-08-13 ENCOUNTER — Ambulatory Visit: Admitting: Family

## 2023-08-24 ENCOUNTER — Ambulatory Visit (INDEPENDENT_AMBULATORY_CARE_PROVIDER_SITE_OTHER): Admitting: Otolaryngology

## 2023-08-24 ENCOUNTER — Encounter (INDEPENDENT_AMBULATORY_CARE_PROVIDER_SITE_OTHER): Payer: Self-pay | Admitting: Otolaryngology

## 2023-08-24 VITALS — BP 117/82 | HR 108 | Ht 67.0 in | Wt 260.0 lb

## 2023-08-24 DIAGNOSIS — H9012 Conductive hearing loss, unilateral, left ear, with unrestricted hearing on the contralateral side: Secondary | ICD-10-CM | POA: Diagnosis not present

## 2023-08-24 DIAGNOSIS — Z9889 Other specified postprocedural states: Secondary | ICD-10-CM | POA: Diagnosis not present

## 2023-08-24 DIAGNOSIS — H938X2 Other specified disorders of left ear: Secondary | ICD-10-CM

## 2023-08-24 DIAGNOSIS — H6122 Impacted cerumen, left ear: Secondary | ICD-10-CM | POA: Diagnosis not present

## 2023-08-24 DIAGNOSIS — H7322 Unspecified myringitis, left ear: Secondary | ICD-10-CM | POA: Diagnosis not present

## 2023-08-24 DIAGNOSIS — H6992 Unspecified Eustachian tube disorder, left ear: Secondary | ICD-10-CM | POA: Diagnosis not present

## 2023-08-24 NOTE — Progress Notes (Signed)
 Dear Dr. Bulah, Here is my assessment for our mutual patient, Laurie Reese. Thank you for allowing me the opportunity to care for your patient. Please do not hesitate to contact me should you have any other questions. Sincerely, Dr. Eldora Blanch  Otolaryngology Clinic Note Referring provider: Dr. Bulah HPI:  Laurie Reese is a 38 y.o. female kindly referred by Dr. Bulah for evaluation of left ear complaints.  Initial visit (05/2023): Patient reports: she reports chronic left ear trouble for several years including multiple infections per year. She reports that she has had ear infections as a child, and had tympanostomy tubes. She did well afterwards, and then started to have issues starting around 2011 when she had acute onset left ear pain and bleeding without antecedent event. She then saw Dr. Bryan, who reported that she had a TM perforation and repaired it 2012. After that, she reports that she did not have issues for a few years, and then started to have recurrent infections starting in 2019 (2-3/year) and then 4-5/year over past few years. Since pregnancy, she has had 3 exacerbations  She denies any nasal symptoms or infections precipitated by URI but her symptoms include left ear pressure, some postauricular pressure and feeling of fullness. Intermittent drainage/wetness. She does have to be careful with water because it seems to cause the infections. Never really has any pain. She frequently gets treated with antibiotics, and they do help though. Reports has never really tried oral steroids.   Last abx was in in Feb (early).  Sx restarted as above about 5 days ago.   Patient denies: vertigo, drainage, tinnitus Patient additionally denies: deep pain in ear canal, eustachian tube symptoms such as popping/crackling, sensitivity to pressure changes Patient also denies barotrauma, vestibular suppressant use, ototoxic medication use Prior ear surgery: Tympanoplasty (Dr. Bryan -  [facility]); BTT  --------------------------------------------------------- 06/15/2023 Returns for follow up. Left ear doing much better after CSF powder; but now starting to have some trouble again with discomfort but not bad. No drainage currently, hearing change, vertigo.   --------------------------------------------------------- 08/24/2023 Returns for follow up. Ear doing well. No drainage or pain. Using CSF powder once every two weeks. No hearing change, no vertigo.   H&N Surgery: see above; Personal or FHx of bleeding dz or anesthesia difficulty: no  GLP-1: no AP/AC: no  PMHx: Healthy  Tobacco: no. Occupation: Human resources officer  Independent Review of Additional Tests or Records:  Dr. Bulah (04/20/2023) FM: Noted chronic ear infection issues; still with fullness; Dx: Chronic fullness, Rx: ref to ENT Velia Pry ([facility]) 03/29/2023: noted left ear concerns, pressure but no pain; no URI; recent Omnicef ; prior tried cortisporin , amox and omnicef  with improvement; h/o rxn to ciprodex. Dx: left ear fullness, no infection Velia Pry 08/11/2021: left ear pain, muffled hearing, drainage; got oral abx; Dx: cerumen and granulation at TM surface; Dx: granulation in ear? Rx: Cortisporin  Powell Buoy ([facility]) Audio 11/30/2022:   Labs: CMP 04/20/2023 and CBC: BUN/Cr 11/0.62; WBC 8.2, Plt 266  05/2023 Audiogram was independently reviewed and interpreted by me and it reveals - normal hearing AD with 100% WRT at 50dB with A tymp; AS with mild/mod CHL with B tymp, 100% WRT at 60dB   SNHL= Sensorineural hearing loss  PMH/Meds/All/SocHx/FamHx/ROS:   Past Medical History:  Diagnosis Date   HPV vaccine counseling    Gardasil series completed ...    HSV-1 infection    Maternal anemia, with delivery    Migraine    Postpartum thyroiditis 06/07/2020  Reactive airway disease    rare flare up     Past Surgical History:  Procedure Laterality Date   MOUTH SURGERY      TYMPANOPLASTY     TYMPANOSTOMY TUBE PLACEMENT     WISDOM TOOTH EXTRACTION      Family History  Problem Relation Age of Onset   Hypertension Mother    Diabetes Mother    Diabetes Father    Crohn's disease Sister    Cancer Maternal Aunt        breast   Heart disease Maternal Grandmother    Cancer Maternal Grandfather        prostate   Diabetes Maternal Grandfather    Hypertension Maternal Grandfather    Heart disease Maternal Grandfather    Prostate cancer Maternal Grandfather    Colon cancer Neg Hx      Social Connections: Not on file      Current Outpatient Medications:    Blood Glucose Monitoring Suppl (FREESTYLE LITE) w/Device KIT, Use as directed to test blood sugar 4 times daily., Disp: 1 kit, Rfl: 0   glucose blood (ACCU-CHEK GUIDE TEST) test strip, Use to test blood sugar 4 times daily., Disp: 100 strip, Rfl: 2   Lancets (FREESTYLE) lancets, Use four times daily to test blood sugar., Disp: 100 each, Rfl: 2   Lancets Misc. (ACCU-CHEK SOFTCLIX LANCET DEV) KIT, use one lancet four times daily as directed, Disp: 1 kit, Rfl: 0   ondansetron  (ZOFRAN -ODT) 4 MG disintegrating tablet, Take 1 tablet (4 mg total) by mouth 2 (two) times daily as needed., Disp: 24 tablet, Rfl: 0   Prenatal Vit-Fe Fumarate-FA (PRENATAL MULTIVITAMIN) TABS tablet, Take 1 tablet by mouth daily at 12 noon., Disp: , Rfl:    cefpodoxime  (VANTIN ) 200 MG tablet, Take 1 tablet (200 mg total) by mouth 2 (two) times daily. (Patient not taking: Reported on 08/24/2023), Disp: 14 tablet, Rfl: 0   terconazole  (TERAZOL 3 ) 0.8 % vaginal cream, Place 1 applicator vaginally at bedtime. (Patient not taking: Reported on 08/24/2023), Disp: 20 g, Rfl: 0   Physical Exam:   BP 117/82 (BP Location: Left Arm, Patient Position: Sitting, Cuff Size: Large)   Pulse (!) 108   Ht 5' 7 (1.702 m)   Wt 260 lb (117.9 kg)   SpO2 96%   BMI 40.72 kg/m   Salient findings:  CN II-XII intact Given history and complaints, ear microscopy  was indicated and performed for evaluation with findings as below in physical exam section and in procedures  Right: EAC clear and TM intact with well pneumatized middle ear space Left: postauricular scar well healed; no eczematoid change of canal; left cerumen impaction; after clearance, posterior what apears to be cartilage graft, with thickened ear drum (likely reconstruction); pars flaccida is clearly retracted but no obvious keratin debris Weber 512: left Rinne 512: AC > BC b/l  Seprately Identifiable Procedures:  Procedure: Bilateral ear microscopy and cerumen removal using microscope (CPT [id number]) - Mod 25 Pre-procedure diagnosis: Cerumen impaction left external ears; history of left tympanoplasty Post-procedure diagnosis: same Indication: see above; given patient's otologic complaints and history as well as for improved and comprehensive examination of external ear and tympanic membrane, bilateral otologic examination using microscope was performed and impacted cerumen removed  Procedure: Patient was placed semi-recumbent. Both ear canals were examined using the microscope with findings above. Impacted Cerumen and debris removed on left with improvement in EAC examination and patency. Patient tolerated the procedure well.  Impression & Plans:  Laurie Reese is a 38 y.o. female with:  1. Dysfunction of left eustachian tube   2. Conductive hearing loss of left ear with unrestricted hearing of right ear   3. Myringitis of left ear   4. Sensation of fullness in left ear   5. History of tympanoplasty    Noted myringitis and pressure could be from ETD/mastoid effusion; she also has a anteroinferior bulge (cartilage?) and some clear pars flaccida retraction. This is stable but she is doing well, without any recent exacerbations  Continue CSF powder - use once or twice weekly two puffs left ear Continue to keep ear dry We discussed chronic nature of this, and will likely need CT after she  has the baby F/u in September 2025 with audio; consider next steps with CT after  See below regarding exact medications prescribed this encounter including dosages and route: None today  Thank you for allowing me the opportunity to care for your patient. Please do not hesitate to contact me should you have any other questions.  Sincerely, Eldora Blanch, MD Otolaryngologist (ENT), [facility] Phone: [phone] Fax: [phone]  08/24/2023, 9:15 AM   MDM:  Level 3 - 99213 Complexity/Problems addressed: mod - multiple chronic problems Data complexity: low - Morbidity: low - Prescription Drug prescribed or managed: no

## 2023-09-01 DIAGNOSIS — O09523 Supervision of elderly multigravida, third trimester: Secondary | ICD-10-CM | POA: Diagnosis not present

## 2023-09-01 DIAGNOSIS — Z3A36 36 weeks gestation of pregnancy: Secondary | ICD-10-CM | POA: Diagnosis not present

## 2023-09-01 DIAGNOSIS — Z3685 Encounter for antenatal screening for Streptococcus B: Secondary | ICD-10-CM | POA: Diagnosis not present

## 2023-09-01 DIAGNOSIS — O24410 Gestational diabetes mellitus in pregnancy, diet controlled: Secondary | ICD-10-CM | POA: Diagnosis not present

## 2023-09-01 LAB — OB RESULTS CONSOLE GBS: GBS: POSITIVE

## 2023-09-09 DIAGNOSIS — O24410 Gestational diabetes mellitus in pregnancy, diet controlled: Secondary | ICD-10-CM | POA: Diagnosis not present

## 2023-09-09 DIAGNOSIS — O09523 Supervision of elderly multigravida, third trimester: Secondary | ICD-10-CM | POA: Diagnosis not present

## 2023-09-09 DIAGNOSIS — Z3A37 37 weeks gestation of pregnancy: Secondary | ICD-10-CM | POA: Diagnosis not present

## 2023-09-15 DIAGNOSIS — O3663X0 Maternal care for excessive fetal growth, third trimester, not applicable or unspecified: Secondary | ICD-10-CM | POA: Diagnosis not present

## 2023-09-15 DIAGNOSIS — Z3A38 38 weeks gestation of pregnancy: Secondary | ICD-10-CM | POA: Diagnosis not present

## 2023-09-15 DIAGNOSIS — O24410 Gestational diabetes mellitus in pregnancy, diet controlled: Secondary | ICD-10-CM | POA: Diagnosis not present

## 2023-09-15 DIAGNOSIS — O09523 Supervision of elderly multigravida, third trimester: Secondary | ICD-10-CM | POA: Diagnosis not present

## 2023-09-16 ENCOUNTER — Telehealth (HOSPITAL_COMMUNITY): Payer: Self-pay | Admitting: *Deleted

## 2023-09-16 ENCOUNTER — Encounter (HOSPITAL_COMMUNITY): Payer: Self-pay | Admitting: *Deleted

## 2023-09-16 NOTE — Telephone Encounter (Signed)
 Preadmission screen

## 2023-09-17 ENCOUNTER — Other Ambulatory Visit: Payer: Self-pay | Admitting: Obstetrics & Gynecology

## 2023-09-20 ENCOUNTER — Inpatient Hospital Stay (HOSPITAL_COMMUNITY)
Admission: RE | Admit: 2023-09-20 | Discharge: 2023-09-23 | DRG: 768 | Disposition: A | Discharge status: Home / Self Care | Attending: Obstetrics & Gynecology | Admitting: Obstetrics & Gynecology

## 2023-09-20 ENCOUNTER — Encounter (HOSPITAL_COMMUNITY): Payer: Self-pay | Admitting: Anesthesiology

## 2023-09-20 ENCOUNTER — Encounter (HOSPITAL_COMMUNITY): Payer: Self-pay | Admitting: Obstetrics & Gynecology

## 2023-09-20 ENCOUNTER — Inpatient Hospital Stay (HOSPITAL_COMMUNITY)

## 2023-09-20 DIAGNOSIS — O9832 Other infections with a predominantly sexual mode of transmission complicating childbirth: Secondary | ICD-10-CM | POA: Diagnosis present

## 2023-09-20 DIAGNOSIS — O24410 Gestational diabetes mellitus in pregnancy, diet controlled: Secondary | ICD-10-CM | POA: Diagnosis present

## 2023-09-20 DIAGNOSIS — O3663X0 Maternal care for excessive fetal growth, third trimester, not applicable or unspecified: Secondary | ICD-10-CM | POA: Diagnosis present

## 2023-09-20 DIAGNOSIS — Z3A39 39 weeks gestation of pregnancy: Secondary | ICD-10-CM | POA: Diagnosis not present

## 2023-09-20 DIAGNOSIS — O99214 Obesity complicating childbirth: Secondary | ICD-10-CM | POA: Diagnosis present

## 2023-09-20 DIAGNOSIS — O24420 Gestational diabetes mellitus in childbirth, diet controlled: Secondary | ICD-10-CM | POA: Diagnosis not present

## 2023-09-20 DIAGNOSIS — Z3490 Encounter for supervision of normal pregnancy, unspecified, unspecified trimester: Secondary | ICD-10-CM | POA: Diagnosis present

## 2023-09-20 DIAGNOSIS — D62 Acute posthemorrhagic anemia: Secondary | ICD-10-CM | POA: Diagnosis not present

## 2023-09-20 DIAGNOSIS — Z833 Family history of diabetes mellitus: Secondary | ICD-10-CM | POA: Diagnosis not present

## 2023-09-20 DIAGNOSIS — Z8249 Family history of ischemic heart disease and other diseases of the circulatory system: Secondary | ICD-10-CM

## 2023-09-20 DIAGNOSIS — A6000 Herpesviral infection of urogenital system, unspecified: Secondary | ICD-10-CM | POA: Diagnosis present

## 2023-09-20 DIAGNOSIS — O99824 Streptococcus B carrier state complicating childbirth: Secondary | ICD-10-CM | POA: Diagnosis present

## 2023-09-20 DIAGNOSIS — O9081 Anemia of the puerperium: Secondary | ICD-10-CM | POA: Diagnosis not present

## 2023-09-20 LAB — CBC
HCT: 33.5 % — ABNORMAL LOW (ref 36.0–46.0)
Hemoglobin: 11.1 g/dL — ABNORMAL LOW (ref 12.0–15.0)
MCH: 28.2 pg (ref 26.0–34.0)
MCHC: 33.1 g/dL (ref 30.0–36.0)
MCV: 85.2 fL (ref 80.0–100.0)
Platelets: 233 K/uL (ref 150–400)
RBC: 3.93 MIL/uL (ref 3.87–5.11)
RDW: 13.3 % (ref 11.5–15.5)
WBC: 10.4 K/uL (ref 4.0–10.5)
nRBC: 0 % (ref 0.0–0.2)

## 2023-09-20 LAB — COMPREHENSIVE METABOLIC PANEL WITH GFR
ALT: 12 U/L (ref 0–44)
AST: 15 U/L (ref 15–41)
Albumin: 2.8 g/dL — ABNORMAL LOW (ref 3.5–5.0)
Alkaline Phosphatase: 100 U/L (ref 38–126)
Anion gap: 10 (ref 5–15)
BUN: 9 mg/dL (ref 6–20)
CO2: 18 mmol/L — ABNORMAL LOW (ref 22–32)
Calcium: 8.9 mg/dL (ref 8.9–10.3)
Chloride: 109 mmol/L (ref 98–111)
Creatinine, Ser: 0.78 mg/dL (ref 0.44–1.00)
GFR, Estimated: >60 mL/min (ref >60)
Glucose, Bld: 98 mg/dL (ref 70–99)
Potassium: 4.2 mmol/L (ref 3.5–5.1)
Sodium: 137 mmol/L (ref 135–145)
Total Bilirubin: 0.3 mg/dL (ref 0.0–1.2)
Total Protein: 6.6 g/dL (ref 6.5–8.1)

## 2023-09-20 LAB — TYPE AND SCREEN
ABO/RH(D): O POS
Antibody Screen: NEGATIVE

## 2023-09-20 LAB — PROTEIN / CREATININE RATIO, URINE
Creatinine, Urine: 285 mg/dL
Protein Creatinine Ratio: 0.16 mg/mg{creat} — ABNORMAL HIGH (ref 0.00–0.15)
Total Protein, Urine: 46 mg/dL

## 2023-09-20 MED ORDER — SODIUM CHLORIDE 0.9 % IV SOLN
5.0000 10*6.[IU] | Freq: Once | INTRAVENOUS | Status: AC
Start: 2023-09-20 — End: 2023-09-20
  Administered 2023-09-20: 5 10*6.[IU] via INTRAVENOUS
  Filled 2023-09-20: qty 5

## 2023-09-20 MED ORDER — MISOPROSTOL 25 MCG QUARTER TABLET
25.0000 ug | ORAL_TABLET | ORAL | Status: DC
  Administered 2023-09-20: 25 ug via BUCCAL
  Filled 2023-09-20: qty 1

## 2023-09-20 MED ORDER — PENICILLIN G POT IN DEXTROSE 60000 UNIT/ML IV SOLN
3.0000 10*6.[IU] | INTRAVENOUS | Status: DC
  Administered 2023-09-21 (×4): 3 10*6.[IU] via INTRAVENOUS
  Filled 2023-09-20 (×4): qty 50

## 2023-09-20 MED ORDER — OXYTOCIN BOLUS FROM INFUSION
333.0000 mL | Freq: Once | INTRAVENOUS | Status: AC
  Administered 2023-09-21: 333 mL via INTRAVENOUS

## 2023-09-20 MED ORDER — SOD CITRATE-CITRIC ACID 500-334 MG/5ML PO SOLN: 30.0000 mL | ORAL | Status: DC | PRN

## 2023-09-20 MED ORDER — OXYTOCIN-SODIUM CHLORIDE 30-0.9 UT/500ML-% IV SOLN
1.0000 m[IU]/min | INTRAVENOUS | Status: DC
  Administered 2023-09-21: 6 m[IU]/min via INTRAVENOUS
  Administered 2023-09-21: 2 m[IU]/min via INTRAVENOUS
  Filled 2023-09-20: qty 500

## 2023-09-20 MED ORDER — TERBUTALINE SULFATE 1 MG/ML IJ SOLN: 0.2500 mg | Freq: Once | INTRAMUSCULAR | Status: DC | PRN

## 2023-09-20 MED ORDER — MISOPROSTOL 25 MCG QUARTER TABLET
25.0000 ug | ORAL_TABLET | ORAL | Status: DC
  Administered 2023-09-20: 25 ug via VAGINAL
  Filled 2023-09-20: qty 1

## 2023-09-20 MED ORDER — ONDANSETRON HCL 4 MG/2ML IJ SOLN
4.0000 mg | Freq: Four times a day (QID) | INTRAMUSCULAR | Status: DC | PRN
  Administered 2023-09-21 (×2): 4 mg via INTRAVENOUS
  Filled 2023-09-20 (×2): qty 2

## 2023-09-20 MED ORDER — OXYTOCIN-SODIUM CHLORIDE 30-0.9 UT/500ML-% IV SOLN
2.5000 [IU]/h | INTRAVENOUS | Status: DC
  Administered 2023-09-21 (×2): 2.5 [IU]/h via INTRAVENOUS
  Filled 2023-09-20: qty 500

## 2023-09-20 MED ORDER — LACTATED RINGERS IV SOLN
500.0000 mL | INTRAVENOUS | Status: AC | PRN
  Administered 2023-09-21: 500 mL via INTRAVENOUS

## 2023-09-20 MED ORDER — FENTANYL CITRATE (PF) 100 MCG/2ML IJ SOLN
100.0000 ug | Freq: Once | INTRAMUSCULAR | Status: AC
  Administered 2023-09-21: 100 ug via INTRAVENOUS
  Filled 2023-09-20: qty 2

## 2023-09-20 MED ORDER — LIDOCAINE HCL (PF) 1 % IJ SOLN: 30.0000 mL | INTRAMUSCULAR | Status: DC | PRN

## 2023-09-20 MED ORDER — LACTATED RINGERS IV SOLN: INTRAVENOUS | Status: AC

## 2023-09-20 MED ORDER — ACETAMINOPHEN 325 MG PO TABS
650.0000 mg | ORAL_TABLET | ORAL | Status: DC | PRN
  Administered 2023-09-21 (×3): 650 mg via ORAL
  Filled 2023-09-20 (×3): qty 2

## 2023-09-20 NOTE — H&P (Signed)
 Laurie Reese is a 38 y.o. female presenting for IOL for GDM A1.  Baby is LGA.  + FMs. No LOF or UCs.  Some boderline BPs 130s / 80s.  G2P1001,  G1- 8'3 SVD   H/o PPh'hage  AMA - NIPT low risk  Obesity A1 GDM by 3hr GTT. BS -F 80s, PP - 1hr <140, occ higher   32 wks growth EFW 4'15 85% AC 95% Vx AFI 16 cm posterior placenta, not low. 36 wk LGA 7'10 95% AC 98% AFI 15.4 cm Vx  GBS +, needs PCN in labor.   OB History     Gravida  2   Para  1   Term  1   Preterm      AB      Living  1      SAB      IAB      Ectopic      Multiple  0   Live Births  1          Past Medical History:  Diagnosis Date   Gestational diabetes    History of postpartum hemorrhage    HPV vaccine counseling    Gardasil series completed ...    HSV-1 infection    Maternal anemia, with delivery    Migraine    Postpartum thyroiditis 06/07/2020   Reactive airway disease    rare flare up   Past Surgical History:  Procedure Laterality Date   MOUTH SURGERY     TYMPANOPLASTY     TYMPANOSTOMY TUBE PLACEMENT     WISDOM TOOTH EXTRACTION     Family History: family history includes Cancer in her maternal aunt and maternal grandfather; Crohn's disease in her sister; Diabetes in her father, maternal grandfather, and mother; Heart disease in her maternal grandfather and maternal grandmother; Hypertension in her maternal grandfather and mother; Prostate cancer in her maternal grandfather. Social History:  reports that she has never smoked. She has never been exposed to tobacco smoke. She has never used smokeless tobacco. She reports that she does not currently use alcohol. She reports that she does not use drugs.     Maternal Diabetes: Yes:  Diabetes Type:  Diet controlled but macrosomic baby Genetic Screening: Normal NIPT  Maternal Ultrasounds/Referrals: Normal Fetal Ultrasounds or other Referrals:  None Maternal Substance Abuse:  No Significant Maternal Medications:  None Significant  Maternal Lab Results:  Group B Strep positive Number of Prenatal Visits:greater than 3 verified prenatal visits Maternal Vaccinations:none this pregnancy TDAP 2021  Other Comments:  None  Review of Systems History   Height 5' 7 (1.702 m), weight 118.8 kg. Exam Physical Exam  Physical exam:  A&O x 3, no acute distress. Pleasant HEENT neg, no thyromegaly Lungs CTA bilat CV RRR, S1S2 normal Abdo soft, non tender, non acute Extr no edema/ tenderness Pelvic  cx closed, long unengaged head  FHT  130s cat I Toco rare   Prenatal labs: ABO, Rh:  O+  Antibody: Negative (11/19 0000) Rubella: Immune (01/02 0000) RPR: Nonreactive (01/02 0000)  HBsAg: Negative (01/02 0000)  Hep C neg  HIV: Non-reactive (01/02 0000)  GBS: Positive/-- (07/02 0000)  NIPT low risk AFP1 neg  Assessment/Plan: 38 yo G2P1001 at 39 wks here for IOL for GDM A1 but with LGA baby  Plan Cytotec  and cervical balloon  BP elevated, PIH labs EFW 9 lbs prepare room for shoulder dystocia     [name] 09/20/2023, 7:28 PM

## 2023-09-21 ENCOUNTER — Inpatient Hospital Stay (HOSPITAL_COMMUNITY): Admitting: Anesthesiology

## 2023-09-21 ENCOUNTER — Encounter (HOSPITAL_COMMUNITY): Payer: Self-pay | Admitting: Obstetrics & Gynecology

## 2023-09-21 DIAGNOSIS — O24410 Gestational diabetes mellitus in pregnancy, diet controlled: Secondary | ICD-10-CM | POA: Diagnosis present

## 2023-09-21 LAB — CBC
HCT: 33 % — ABNORMAL LOW (ref 36.0–46.0)
HCT: 26.2 % — ABNORMAL LOW (ref 36.0–46.0)
Hemoglobin: 11.1 g/dL — ABNORMAL LOW (ref 12.0–15.0)
Hemoglobin: 8.6 g/dL — ABNORMAL LOW (ref 12.0–15.0)
MCH: 28.5 pg (ref 26.0–34.0)
MCH: 28.1 pg (ref 26.0–34.0)
MCHC: 33.6 g/dL (ref 30.0–36.0)
MCHC: 32.8 g/dL (ref 30.0–36.0)
MCV: 84.8 fL (ref 80.0–100.0)
MCV: 85.6 fL (ref 80.0–100.0)
Platelets: 230 K/uL (ref 150–400)
Platelets: 210 K/uL (ref 150–400)
RBC: 3.89 MIL/uL (ref 3.87–5.11)
RBC: 3.06 MIL/uL — ABNORMAL LOW (ref 3.87–5.11)
RDW: 13.4 % (ref 11.5–15.5)
RDW: 13.3 % (ref 11.5–15.5)
WBC: 13.7 K/uL — ABNORMAL HIGH (ref 4.0–10.5)
WBC: 17.5 K/uL — ABNORMAL HIGH (ref 4.0–10.5)
nRBC: 0 % (ref 0.0–0.2)
nRBC: 0 % (ref 0.0–0.2)

## 2023-09-21 LAB — GLUCOSE, CAPILLARY
Glucose-Capillary: 86 mg/dL (ref 70–99)
Glucose-Capillary: 79 mg/dL (ref 70–99)
Glucose-Capillary: 78 mg/dL (ref 70–99)
Glucose-Capillary: 76 mg/dL (ref 70–99)

## 2023-09-21 LAB — RPR: RPR Ser Ql: NONREACTIVE

## 2023-09-21 MED ORDER — IBUPROFEN 600 MG PO TABS
600.0000 mg | ORAL_TABLET | Freq: Four times a day (QID) | ORAL | Status: DC
  Administered 2023-09-22 – 2023-09-23 (×7): 600 mg via ORAL
  Filled 2023-09-21 (×7): qty 1

## 2023-09-21 MED ORDER — ZOLPIDEM TARTRATE 5 MG PO TABS: 5.0000 mg | ORAL_TABLET | Freq: Every evening | ORAL | Status: DC | PRN

## 2023-09-21 MED ORDER — DIPHENHYDRAMINE HCL 25 MG PO CAPS
25.0000 mg | ORAL_CAPSULE | Freq: Four times a day (QID) | ORAL | Status: DC | PRN
Start: 2023-09-21 — End: 2023-09-23

## 2023-09-21 MED ORDER — MISOPROSTOL 200 MCG PO TABS: 1000.0000 ug | ORAL_TABLET | Freq: Once | ORAL | Status: AC

## 2023-09-21 MED ORDER — LACTATED RINGERS IV SOLN
500.0000 mL | Freq: Once | INTRAVENOUS | Status: AC
  Administered 2023-09-21: 500 mL via INTRAVENOUS

## 2023-09-21 MED ORDER — SODIUM CHLORIDE 0.9 % IV SOLN
3.0000 g | Freq: Once | INTRAVENOUS | Status: AC
  Administered 2023-09-22: 3 g via INTRAVENOUS
  Filled 2023-09-21 (×2): qty 8

## 2023-09-21 MED ORDER — EPHEDRINE 5 MG/ML INJ: 10.0000 mg | INTRAVENOUS | Status: DC | PRN

## 2023-09-21 MED ORDER — PHENYLEPHRINE 80 MCG/ML (10ML) SYRINGE FOR IV PUSH (FOR BLOOD PRESSURE SUPPORT): 80.0000 ug | PREFILLED_SYRINGE | INTRAVENOUS | Status: DC | PRN

## 2023-09-21 MED ORDER — TRANEXAMIC ACID-NACL 1000-0.7 MG/100ML-% IV SOLN
1000.0000 mg | INTRAVENOUS | Status: AC
  Administered 2023-09-21: 1000 mg via INTRAVENOUS

## 2023-09-21 MED ORDER — MISOPROSTOL 200 MCG PO TABS
ORAL_TABLET | ORAL | Status: AC
  Administered 2023-09-21: 1000 ug via RECTAL
  Filled 2023-09-21: qty 5

## 2023-09-21 MED ORDER — ONDANSETRON HCL 4 MG/2ML IJ SOLN: 4.0000 mg | INTRAMUSCULAR | Status: DC | PRN

## 2023-09-21 MED ORDER — COCONUT OIL OIL: 1.0000 | TOPICAL_OIL | Status: DC | PRN

## 2023-09-21 MED ORDER — SENNOSIDES-DOCUSATE SODIUM 8.6-50 MG PO TABS
2.0000 | ORAL_TABLET | Freq: Every day | ORAL | Status: DC
  Administered 2023-09-22 – 2023-09-23 (×2): 2 via ORAL
  Filled 2023-09-21 (×2): qty 2

## 2023-09-21 MED ORDER — ONDANSETRON HCL 4 MG PO TABS: 4.0000 mg | ORAL_TABLET | ORAL | Status: DC | PRN

## 2023-09-21 MED ORDER — LIDOCAINE HCL (PF) 1 % IJ SOLN
INTRAMUSCULAR | Status: DC | PRN
  Administered 2023-09-21: 5 mL via EPIDURAL

## 2023-09-21 MED ORDER — DIBUCAINE (PERIANAL) 1 % EX OINT: 1.0000 | TOPICAL_OINTMENT | CUTANEOUS | Status: DC | PRN

## 2023-09-21 MED ORDER — PRENATAL MULTIVITAMIN CH
1.0000 | ORAL_TABLET | Freq: Every day | ORAL | Status: DC
  Administered 2023-09-22 – 2023-09-23 (×2): 1 via ORAL
  Filled 2023-09-21 (×2): qty 1

## 2023-09-21 MED ORDER — ACETAMINOPHEN 325 MG PO TABS
650.0000 mg | ORAL_TABLET | ORAL | Status: DC | PRN
  Administered 2023-09-23: 650 mg via ORAL
  Filled 2023-09-21: qty 2

## 2023-09-21 MED ORDER — SIMETHICONE 80 MG PO CHEW: 80.0000 mg | CHEWABLE_TABLET | ORAL | Status: DC | PRN

## 2023-09-21 MED ORDER — DIPHENHYDRAMINE HCL 50 MG/ML IJ SOLN: 12.5000 mg | INTRAMUSCULAR | Status: DC | PRN

## 2023-09-21 MED ORDER — TETANUS-DIPHTH-ACELL PERTUSSIS 5-2.5-18.5 LF-MCG/0.5 IM SUSY
0.5000 mL | PREFILLED_SYRINGE | Freq: Once | INTRAMUSCULAR | Status: DC
Start: 2023-09-22 — End: 2023-09-23

## 2023-09-21 MED ORDER — WITCH HAZEL-GLYCERIN EX PADS: 1.0000 | MEDICATED_PAD | CUTANEOUS | Status: DC | PRN

## 2023-09-21 MED ORDER — FENTANYL-BUPIVACAINE-NACL 0.5-0.125-0.9 MG/250ML-% EP SOLN
12.0000 mL/h | EPIDURAL | Status: DC | PRN
  Administered 2023-09-21: 12 mL/h via EPIDURAL
  Filled 2023-09-21: qty 250

## 2023-09-21 MED ORDER — BENZOCAINE-MENTHOL 20-0.5 % EX AERO
1.0000 | INHALATION_SPRAY | CUTANEOUS | Status: DC | PRN
  Filled 2023-09-21: qty 56

## 2023-09-21 MED ORDER — TRANEXAMIC ACID-NACL 1000-0.7 MG/100ML-% IV SOLN
INTRAVENOUS | Status: AC
  Filled 2023-09-21: qty 100

## 2023-09-21 NOTE — Progress Notes (Signed)
 Laurie Reese is a 38 y.o. G2P1001 at [redacted]w[redacted]d IOL for A1GDM w/ macrosomia   Subjective: S/p one 25+25 mcg cytotec  dose and is contracting since. Feels some cramps  no pressure   Objective: BP (!) 141/95   Pulse 86   Temp 98.4 F (36.9 C) (Oral)   Resp 16   Ht 5' 7 (1.702 m)   Wt 118.8 kg   BMI 41.02 kg/m   FHT:  120=130 mod variability, marked in some areas, + accels (huge accels) no decels cat I UC:   irreg  SVE:   Dilation: 1 Effacement (%): 30 Station: -3 Exam by:: Laurie Sankey,MD Cervical balloon placed after verbal consent and pt did well . High unengaged head   Labs: Lab Results  Component Value Date   WBC 10.4 09/20/2023   HGB 11.1 (L) 09/20/2023   HCT 33.5 (L) 09/20/2023   MCV 85.2 09/20/2023   PLT 233 09/20/2023   CBG (last 3)  Recent Labs    09/21/23 0144  GLUCAP 86     Assessment / Plan: G2P1001, IOL 39.1 wks GDM A1, macrosomia EFW 9 lbs (proven to 8'3)   Labor: early, Start low dose pitocin  until foley expels and then AROM  Preeclampsia:  No present/ Labs and urine pc nl.  Transient GHTN, no BP Meds needed , no neural ss  Fetal Wellbeing:  Category I Pain Control:  Epidural in active labor. Rest and ambulate to help descent I/D:  n/a Anticipated MOD:  NSVD plan, prepare room for shoulder dystocia   Laurie JONELLE Render, MD 09/21/2023, 2:27 AM

## 2023-09-21 NOTE — Anesthesia Preprocedure Evaluation (Signed)
 Anesthesia Evaluation  Patient identified by MRN, date of birth, ID band Patient awake    Reviewed: Allergy & Precautions, NPO status , Patient's Chart, lab work & pertinent test results  Airway Mallampati: II  TM Distance: >3 FB Neck ROM: Full    Dental no notable dental hx. (+) Teeth Intact, Dental Advisory Given   Pulmonary    Pulmonary exam normal breath sounds clear to auscultation       Cardiovascular negative cardio ROS Normal cardiovascular exam Rhythm:Regular Rate:Normal     Neuro/Psych  Headaches  negative psych ROS   GI/Hepatic negative GI ROS, Neg liver ROS,,,  Endo/Other  diabetes    Renal/GU negative Renal ROS     Musculoskeletal   Abdominal  (+) + obese  Peds  Hematology Lab Results      Component                Value               Date                      WBC                      13.7 (H)            09/21/2023                HGB                      11.1 (L)            09/21/2023                HCT                      33.0 (L)            09/21/2023                MCV                      84.8                09/21/2023                PLT                      230                 09/21/2023              Anesthesia Other Findings   Reproductive/Obstetrics (+) Pregnancy                              Anesthesia Physical Anesthesia Plan  ASA: 3  Anesthesia Plan: Epidural   Post-op Pain Management:    Induction:   PONV Risk Score and Plan:   Airway Management Planned:   Additional Equipment:   Intra-op Plan:   Post-operative Plan:   Informed Consent: I have reviewed the patients History and Physical, chart, labs and discussed the procedure including the risks, benefits and alternatives for the proposed anesthesia with the patient or authorized representative who has indicated his/her understanding and acceptance.       Plan Discussed with:   Anesthesia  Plan Comments: (39.1 wk G2P1 w gDm and BMI 41 for  LEA)        Anesthesia Quick Evaluation

## 2023-09-21 NOTE — Anesthesia Preprocedure Evaluation (Signed)
 Anesthesia Evaluation  Patient identified by MRN, date of birth, ID band Patient awake    Reviewed: Allergy & Precautions, NPO status , Patient's Chart, lab work & pertinent test results  Airway Mallampati: II  TM Distance: >3 FB Neck ROM: Full    Dental no notable dental hx. (+) Teeth Intact, Dental Advisory Given   Pulmonary neg pulmonary ROS   Pulmonary exam normal breath sounds clear to auscultation       Cardiovascular negative cardio ROS Normal cardiovascular exam Rhythm:Regular Rate:Normal     Neuro/Psych  Headaches  negative psych ROS   GI/Hepatic negative GI ROS,,,  Endo/Other  diabetes, Gestational    Renal/GU      Musculoskeletal negative musculoskeletal ROS (+)    Abdominal  (+) + obese  Peds  Hematology Lab Results      Component                Value               Date                      WBC                      13.7 (H)            09/21/2023                HGB                      11.1 (L)            09/21/2023                HCT                      33.0 (L)            09/21/2023                MCV                      84.8                09/21/2023                PLT                      230                 09/21/2023              Anesthesia Other Findings   Reproductive/Obstetrics (+) Pregnancy                              Anesthesia Physical Anesthesia Plan  ASA: 3  Anesthesia Plan: Epidural   Post-op Pain Management:    Induction:   PONV Risk Score and Plan:   Airway Management Planned:   Additional Equipment:   Intra-op Plan:   Post-operative Plan:   Informed Consent: I have reviewed the patients History and Physical, chart, labs and discussed the procedure including the risks, benefits and alternatives for the proposed anesthesia with the patient or authorized representative who has indicated his/her understanding and acceptance.       Plan  Discussed with:   Anesthesia Plan Comments: (39.1 wk G2p1 w gDm and  BMI 41 for LEA)        Anesthesia Quick Evaluation

## 2023-09-21 NOTE — Progress Notes (Addendum)
 Patient ID: Laurie Reese, female   DOB: May 25, 1985, 38 y.o.   MRN: [id number] IOL for GDM,, LGA baby Complete and 0 at 4.15 pm Laboring down with peanut ball to allow rotation Reassess stn at 1 hr  Plan to push at +2 stn only  Pt counseled, understands

## 2023-09-21 NOTE — Progress Notes (Signed)
 Patient ID: Laurie Reese, female   DOB: 1985-04-10, 38 y.o.   MRN: [id number] CTSP for blood clots per vagina w/ fundal rubs., RN informs several gold size clots when she called.  I arrived soon after as I was en route to hospital Stable female, eating regular diet. Not got out of bed yet.  Pt had TXA and 1000 mcg rectal cytotec  at delivery due to hx of PPH.  I assessed, VS stable.  Uterus firm, upper segment closed on bimanual exam but lower segment boggy.  Jada placed after verbal consent and inflated. Top of Jada curled in lower segment but remained in place and attached to wall suction.  Reassess at 1 hour   --V,Laurie Nyquist MD

## 2023-09-21 NOTE — Anesthesia Procedure Notes (Signed)
 Epidural Patient location during procedure: OB Start time: 09/21/2023 11:01 AM End time: 09/21/2023 11:16 AM  Staffing Anesthesiologist: Jefm Garnette LABOR, MD Performed: anesthesiologist   Preanesthetic Checklist Completed: patient identified, IV checked, site marked, risks and benefits discussed, surgical consent, monitors and equipment checked, pre-op evaluation and timeout performed  Epidural Patient position: sitting Prep: DuraPrep and site prepped and draped Patient monitoring: continuous pulse ox and blood pressure Approach: midline Location: L3-L4 Injection technique: LOR air  Needle:  Needle type: Tuohy  Needle gauge: 17 G Needle length: 9 cm and 9 Needle insertion depth: 7 cm Catheter type: closed end flexible Catheter size: 19 Gauge Catheter at skin depth: 13 cm Test dose: negative  Assessment Events: blood not aspirated, no cerebrospinal fluid, injection not painful, no injection resistance, no paresthesia and negative IV test  Additional Notes Patient identified. Risks/Benefits/Options discussed with patient including but not limited to bleeding, infection, nerve damage, paralysis, failed block, incomplete pain control, headache, blood pressure changes, nausea, vomiting, reactions to medication both or allergic, itching and postpartum back pain. Confirmed with bedside nurse the patient's most recent platelet count. Confirmed with patient that they are not currently taking any anticoagulation, have any bleeding history or any family history of bleeding disorders. Patient expressed understanding and wished to proceed. All questions were answered. Sterile technique was used throughout the entire procedure. Please see nursing notes for vital signs. Test dose was given through epidural needle and negative prior to continuing to dose epidural or start infusion. Warning signs of high block given to the patient including shortness of breath, tingling/numbness in hands, complete  motor block, or any concerning symptoms with instructions to call for help. Patient was given instructions on fall risk and not to get out of bed. All questions and concerns addressed with instructions to call with any issues.  2 Attempt (S) . Patient tolerated procedure well.

## 2023-09-22 LAB — CBC
HCT: 22.2 % — ABNORMAL LOW (ref 36.0–46.0)
Hemoglobin: 7.4 g/dL — ABNORMAL LOW (ref 12.0–15.0)
MCH: 28.5 pg (ref 26.0–34.0)
MCHC: 33.3 g/dL (ref 30.0–36.0)
MCV: 85.4 fL (ref 80.0–100.0)
Platelets: 189 K/uL (ref 150–400)
RBC: 2.6 MIL/uL — ABNORMAL LOW (ref 3.87–5.11)
RDW: 13.6 % (ref 11.5–15.5)
WBC: 12.4 K/uL — ABNORMAL HIGH (ref 4.0–10.5)
nRBC: 0 % (ref 0.0–0.2)

## 2023-09-22 MED ORDER — SODIUM CHLORIDE 0.9 % IV SOLN
500.0000 mg | Freq: Once | INTRAVENOUS | Status: AC
  Administered 2023-09-22: 500 mg via INTRAVENOUS
  Filled 2023-09-22: qty 25

## 2023-09-22 MED ORDER — SODIUM CHLORIDE 0.9 % IV SOLN: INTRAVENOUS | Status: DC | PRN

## 2023-09-22 NOTE — Anesthesia Postprocedure Evaluation (Signed)
 Anesthesia Post Note  Patient: Laurie Reese  Procedure(s) Performed: AN AD HOC LABOR EPIDURAL     Patient location during evaluation: Mother Baby Anesthesia Type: Epidural Level of consciousness: awake and alert and oriented Pain management: satisfactory to patient Vital Signs Assessment: post-procedure vital signs reviewed and stable Respiratory status: respiratory function stable Cardiovascular status: stable Postop Assessment: no headache, no backache, epidural receding, patient able to bend at knees, no signs of nausea or vomiting, adequate PO intake and able to ambulate Anesthetic complications: no   No notable events documented.  Last Vitals:  Vitals:   09/22/23 0036 09/22/23 0450  BP: 120/78 122/78  Pulse: 100 87  Resp: 17 16  Temp:  36.5 C  SpO2: 98% 99%    Last Pain:  Vitals:   09/22/23 0719  TempSrc:   PainSc: 5    Pain Goal:                   Lafe Clerk

## 2023-09-22 NOTE — Progress Notes (Deleted)
 This RN spoke with Dr. Louana in [facility] hallway about removing patient's epidural this morning with platelets now at 189 and being post [facility]. OK to pull epidural this morning.

## 2023-09-22 NOTE — Progress Notes (Addendum)
 No c/o, tol po, ambulating Voids w/o difficulty, pain controlled Breastfeeding; no lightheaded/dizziness, did feel queezy at one poing  Patient Vitals for the past 24 hrs:  BP Temp Temp src Pulse Resp SpO2  09/22/23 0918 125/84 97.7 F (36.5 C) Oral 87 18 99 %  09/22/23 0450 122/78 97.7 F (36.5 C) Oral 87 16 99 %  09/22/23 0036 120/78 -- -- 100 17 98 %  09/21/23 2319 128/88 98 F (36.7 C) Oral (!) 103 19 100 %  09/21/23 2231 (!) 124/106 -- -- -- -- --  09/21/23 2226 112/74 -- -- (!) 122 -- --  09/21/23 2213 124/77 -- -- (!) 109 -- --  09/21/23 2131 115/80 -- -- (!) 111 -- 98 %  09/21/23 2126 123/84 -- -- (!) 120 -- --  09/21/23 2121 122/74 -- -- (!) 117 -- --  09/21/23 2116 131/84 -- -- (!) 124 -- --  09/21/23 2111 119/74 -- -- (!) 120 -- --  09/21/23 2108 126/83 -- -- (!) 124 -- --  09/21/23 2051 114/76 -- -- (!) 130 -- --  09/21/23 2046 123/69 -- -- (!) 106 -- --  09/21/23 2044 121/75 -- -- (!) 121 -- --  09/21/23 2043 117/71 -- -- (!) 110 -- --  09/21/23 2033 116/75 -- -- (!) 125 -- --  09/21/23 2023 122/83 -- -- (!) 137 -- --  09/21/23 1946 120/89 -- -- (!) 124 -- --  09/21/23 1931 (!) 137/105 -- -- (!) 119 -- --  09/21/23 1920 125/86 -- -- 100 -- --  09/21/23 1847 133/64 -- -- 95 -- --  09/21/23 1842 135/82 -- -- (!) 103 -- --  09/21/23 1801 130/72 -- -- 97 -- --  09/21/23 1737 122/72 -- -- 94 -- --  09/21/23 1735 -- -- -- -- -- 97 %  09/21/23 1734 115/73 -- -- (!) 105 -- --  09/21/23 1731 (!) 108/57 -- -- (!) 110 -- --  09/21/23 1725 -- -- -- -- -- 98 %  09/21/23 1701 120/78 -- -- (!) 102 -- --  09/21/23 1631 (!) 140/81 -- -- (!) 120 -- --  09/21/23 1540 123/70 -- -- 89 -- --  09/21/23 1501 139/83 -- -- (!) 101 -- --  09/21/23 1431 (!) 131/59 -- -- (!) 113 -- --  09/21/23 1401 (!) 142/92 -- -- 90 -- --  09/21/23 1331 (!) 133/90 98.8 F (37.1 C) Oral 95 17 --  09/21/23 1301 117/70 -- -- 80 -- --  09/21/23 1231 126/65 -- -- 87 -- --  09/21/23 1202 132/70 -- --  91 -- --  09/21/23 1151 (!) 145/89 -- -- 94 -- --  09/21/23 1142 (!) 147/90 -- -- (!) 101 -- --  09/21/23 1139 (!) 156/94 -- -- (!) 109 -- --  09/21/23 1131 127/72 -- -- (!) 104 -- --  09/21/23 1126 135/82 -- -- 80 -- --  09/21/23 1121 133/85 -- -- 93 -- --  09/21/23 1116 (!) 142/77 -- -- 91 -- 99 %  09/21/23 1115 -- -- -- -- -- 99 %  09/21/23 1110 (!) 139/94 -- -- 92 -- 100 %  09/21/23 1033 121/77 -- -- 80 19 --   A&ox3 Rrr Ctab Abd: soft,nt,nd, fundus firm and below umb LE: no edema,nt bilat     Latest Ref Rng & Units 09/22/2023    5:39 AM 09/21/2023   11:12 PM 09/21/2023    9:00 AM  CBC  WBC 4.0 - 10.5 K/uL 12.4  17.5  13.7   Hemoglobin 12.0 - 15.0 g/dL 7.4  8.6  88.8   Hematocrit 36.0 - 46.0 % 22.2  26.2  33.0   Platelets 150 - 400 K/uL 189  210  230    A/P:  ppd2 s/p svd complicated with PPH Doing well, plan d/c home tomorrow, encourage small meals/protein, avoid greasy foods Anemia d/t abl - not symptomatic, significant; plan iv iron  today Rh pos Rubella Immune Breastfeeding, girl Gdma1 - f/u pp

## 2023-09-22 NOTE — Progress Notes (Signed)
 STAT IV team consult order placed for patient to receive the rest of iron  infusion.

## 2023-09-22 NOTE — Lactation Note (Signed)
 This note was copied from a baby's chart. Lactation Consultation Note  Patient Name: Laurie Reese Today's Date: 09/22/2023 Age:36 hours Reason for consult: Initial assessment;Term;Maternal endocrine disorder  P2- MOB reports having a poor supply with her first child, but states that she never saw an outpatient LC for assistance. MOB used a nipple shield with her first one and immediately started supplementing after delivery. MOB reports that she has inverted, hence why she uses a nipple shield. MOB allowed LC to assess her nipples for flange sizing and LC noted that they are round and everted, just small. LC reviewed this with MOB and provided a manual pump to assist her as needed. MOB is using a nipple shield that she brought from home. LC encouraged MOB to use our outpatient team as a resource for this breastfeeding experience. MOB reports that she will call them if she needs assistance.   MOB requested to not be followed up on because she feels confident with breastfeeding. LC reviewed the first 24 hr birthday nap, day 2 cluster feeding, feeding infant on cue 8-12x in 24 hrs, not allowing infant to go over 3 hrs without a feeding, CDC milk storage guidelines, LC services handout and engorgement/breast care. LC encouraged MOB to call for further assistance as needed.  Maternal Data Has patient been taught Hand Expression?: No Does the patient have breastfeeding experience prior to this delivery?: Yes How long did the patient breastfeed?: 3 months of both breast milk and formula, then switched to formula only  Feeding Mother's Current Feeding Choice: Breast Milk and Formula  Lactation Tools Discussed/Used Tools: Pump;Flanges;Nipple Shields Nipple shield size: 20 (per MOB request) Flange Size: 18 Pump Education: Milk Storage  Interventions Interventions: Breast feeding basics reviewed;Education;LC Services brochure  Discharge Discharge Education: Engorgement and breast  care;Warning signs for feeding baby Pump: DEBP;Personal  Consult Status Consult Status: Complete (mother declined follow up) Date: 09/22/23    Recardo Hoit BS, IBCLC 09/22/2023, 3:16 PM

## 2023-09-22 NOTE — Progress Notes (Signed)
 Patients IV infiltrated. We will have another IV put in and finish Iron  infusion.

## 2023-09-23 ENCOUNTER — Other Ambulatory Visit (HOSPITAL_COMMUNITY): Payer: Self-pay

## 2023-09-23 LAB — BIRTH TISSUE RECOVERY COLLECTION (PLACENTA DONATION)

## 2023-09-23 MED ORDER — IBUPROFEN 600 MG PO TABS
600.0000 mg | ORAL_TABLET | Freq: Four times a day (QID) | ORAL | 0 refills | Status: DC
  Filled 2023-09-23 (×3): qty 30, 8d supply, fill #0

## 2023-09-23 NOTE — Progress Notes (Addendum)
 No c/o, pain controlled, nml lochia Voids w/o difficulty Breastfeeding Feeling much better today  Patient Vitals for the past 24 hrs:  BP Temp Temp src Pulse Resp SpO2  09/23/23 0500 129/83 -- Oral 72 17 100 %  09/22/23 2240 127/67 97.8 F (36.6 C) Oral 79 18 100 %  09/22/23 1355 120/73 97.7 F (36.5 C) Oral 93 18 99 %   A&ox3 Nml respirations Abd: soft,nt,nd; fundus firm and below umb LE: no edema, nt bilat     Latest Ref Rng & Units 09/22/2023    5:39 AM 09/21/2023   11:12 PM 09/21/2023    9:00 AM  CBC  WBC 4.0 - 10.5 K/uL 12.4  17.5  13.7   Hemoglobin 12.0 - 15.0 g/dL 7.4  8.6  88.8   Hematocrit 36.0 - 46.0 % 22.2  26.2  33.0   Platelets 150 - 400 K/uL 189  210  230    A/P: ppd 2 s/p svd with pph Doing well, contin care; d/c home today Severe anemia - asymptomatic, s/p iv iron , contin oral iron  daily; f/u 2 wk Rh pos RI Breastfeeding, girl Gdma1 - plan f/u pp

## 2023-09-23 NOTE — Discharge Summary (Signed)
 Postpartum Discharge Summary  Date of Service updated     Patient Name: Laurie Reese DOB: 1985/10/17 MRN: [id number]  Date of admission: 09/20/2023 Delivery date:09/21/2023 Delivering provider: MODY, VAISHALI Date of discharge: 09/23/2023  Admitting diagnosis: Encounter for induction of labor [Z34.90] SVD (spontaneous vaginal delivery) [O80] Intrauterine pregnancy: [redacted]w[redacted]d     Secondary diagnosis:  Principal Problem:   Postpartum care following vaginal delivery 7/22 Active Problems:   Encounter for induction of labor   SVD (spontaneous vaginal delivery)   GDM, class A1  Additional problems: none    Discharge diagnosis: Term Pregnancy Delivered                                              Post partum procedures:iv iron  Complications: None  Hospital course: Induction of Labor With Vaginal Delivery   38 y.o. yo [id number] at [redacted]w[redacted]d was admitted to the hospital 09/20/2023 for induction of labor.  Indication for induction: A1 DM.  Patient had an labor course complicated by nothing. Membrane Rupture Time/Date: 8:14 AM,09/21/2023  Delivery Method:Vaginal, Spontaneous Episiotomy: None Lacerations:  None Details of delivery can be found in separate delivery note.  Patient had a postpartum course complicated by nothing. Patient is discharged home 09/23/23.  Newborn Data: Birth date:09/21/2023 Birth time:6:30 PM Gender:Female Living status:Living Apgars:9 ,9  Weight:4110 g  Immunizations administered: Immunization History  Administered Date(s) Administered   HIB (PRP-OMP) 07/06/1989   Hepatitis B, ADULT 10/26/1999, 12/31/1999, 06/15/2000   Influenza Nasal 11/16/2013   Influenza Split 11/21/2010, 11/01/2012, 01/03/2014   Influenza-Unspecified 11/30/2020, 11/10/2022   MMR 07/06/1989, 09/09/1993   PFIZER(Purple Top)SARS-COV-2 Vaccination 07/15/2019, 08/11/2019, 02/16/2020   PPD Test 05/30/2008   Tdap 05/29/2008, 07/13/2018, 06/27/2019   Varicella 07/26/2008    Physical exam   Vitals:   09/22/23 0918 09/22/23 1355 09/22/23 2240 09/23/23 0500  BP: 125/84 120/73 127/67 129/83  Pulse: 87 93 79 72  Resp: 18 18 18 17   Temp: 97.7 F (36.5 C) 97.7 F (36.5 C) 97.8 F (36.6 C)   TempSrc: Oral Oral Oral Oral  SpO2: 99% 99% 100% 100%  Weight:      Height:       Labs: Lab Results  Component Value Date   WBC 12.4 (H) 09/22/2023   HGB 7.4 (L) 09/22/2023   HCT 22.2 (L) 09/22/2023   MCV 85.4 09/22/2023   PLT 189 09/22/2023      Latest Ref Rng & Units 09/20/2023    8:53 PM  CMP  Glucose 70 - 99 mg/dL 98   BUN 6 - 20 mg/dL 9   Creatinine 9.55 - 8.99 mg/dL 9.21   Sodium 864 - 854 mmol/L 137   Potassium 3.5 - 5.1 mmol/L 4.2   Chloride 98 - 111 mmol/L 109   CO2 22 - 32 mmol/L 18   Calcium 8.9 - 10.3 mg/dL 8.9   Total Protein 6.5 - 8.1 g/dL 6.6   Total Bilirubin 0.0 - 1.2 mg/dL 0.3   Alkaline Phos 38 - 126 U/L 100   AST 15 - 41 U/L 15   ALT 0 - 44 U/L 12    Edinburgh Score:    09/22/2023   10:40 PM  Edinburgh Postnatal Depression Scale Screening Tool  I have been able to laugh and see the funny side of things. 0  I have looked forward with enjoyment to things. 0  I have blamed myself unnecessarily when things went wrong. 0  I have been anxious or worried for no good reason. 0  I have felt scared or panicky for no good reason. 0  Things have been getting on top of me. 0  I have been so unhappy that I have had difficulty sleeping. 0  I have felt sad or miserable. 0  I have been so unhappy that I have been crying. 0  The thought of harming myself has occurred to me. 0  Edinburgh Postnatal Depression Scale Total 0      After visit meds:  Allergies as of 09/23/2023       Reactions   Celexa [citalopram] Swelling   Swelling on face   Ciprodex [ciprofloxacin-dexamethasone] Swelling   Swelling on face        Medication List     TAKE these medications    ibuprofen  600 MG tablet Commonly known as: ADVIL  Take 1 tablet (600 mg total) by mouth  every 6 (six) hours.   prenatal multivitamin Tabs tablet Take 1 tablet by mouth daily at 12 noon.         Discharge home in stable condition Infant Feeding: Breast Infant Disposition:home with mother Discharge instruction: per After Visit Summary and Postpartum booklet. Activity: Advance as tolerated. Pelvic rest for 6 weeks.  Diet: routine diet Anticipated Birth Control: uncertain Postpartum Appointment:6 weeks Additional Postpartum F/U: f/u anemia 2 wks Future Appointments: Future Appointments  Date Time Provider Department Center  11/03/2023  4:00 PM Jordis Laneta FALCON, MD [facility] None  11/18/2023  2:30 PM Leroux-Martinez, Rosaline Jansky, AUD [facility] None  11/18/2023  3:00 PM Tobie Eldora NOVAK, MD [facility] None   Follow up Visit:  Follow-up Information     Barbette Knock, MD Follow up.   Specialty: Obstetrics and Gynecology Contact information: [address] [place] [address] [phone]                     09/23/2023 Devere FORBES Brave, MD

## 2023-10-04 DIAGNOSIS — Z3A00 Weeks of gestation of pregnancy not specified: Secondary | ICD-10-CM | POA: Diagnosis not present

## 2023-10-04 DIAGNOSIS — O99019 Anemia complicating pregnancy, unspecified trimester: Secondary | ICD-10-CM | POA: Diagnosis not present

## 2023-10-09 ENCOUNTER — Telehealth (HOSPITAL_COMMUNITY): Payer: Self-pay

## 2023-10-09 NOTE — Telephone Encounter (Signed)
 10/09/2023 1426  Name: Laurie Reese MRN: [id number] DOB: 1985-08-19  Reason for Call:  Transition of Care Hospital Discharge Call  Contact Status: Patient Contact Status: Complete  Language assistant needed:          Follow-Up Questions: Do You Have Any Concerns About Your Health As You Heal From Delivery?: No Do You Have Any Concerns About Your Infants Health?: No  Edinburgh Postnatal Depression Scale:  In the Past 7 Days: I have been able to laugh and see the funny side of things.: As much as I always could I have looked forward with enjoyment to things.: As much as I ever did I have blamed myself unnecessarily when things went wrong.: No, never I have been anxious or worried for no good reason.: No, not at all I have felt scared or panicky for no good reason.: No, not at all Things have been getting on top of me.: No, I have been coping as well as ever I have been so unhappy that I have had difficulty sleeping.: Not at all I have felt sad or miserable.: No, not at all I have been so unhappy that I have been crying.: No, never The thought of harming myself has occurred to me.: Never [place] Postnatal Depression Scale Total: 0  PHQ2-9 Depression Scale:     Discharge Follow-up: Edinburgh score requires follow up?: No Patient was advised of the following resources:: Breastfeeding Support Group, Support Group  Post-discharge interventions: Reviewed Newborn Safe Sleep Practices  Signature  Rosaline Deretha PEAK

## 2023-10-21 ENCOUNTER — Other Ambulatory Visit (HOSPITAL_COMMUNITY): Payer: Self-pay

## 2023-10-21 MED ORDER — AMOXICILLIN-POT CLAVULANATE 875-125 MG PO TABS
1.0000 | ORAL_TABLET | Freq: Two times a day (BID) | ORAL | 0 refills | Status: DC
  Filled 2023-10-21: qty 20, 10d supply, fill #0

## 2023-10-29 DIAGNOSIS — Z1331 Encounter for screening for depression: Secondary | ICD-10-CM | POA: Diagnosis not present

## 2023-10-29 DIAGNOSIS — O9122 Nonpurulent mastitis associated with the puerperium: Secondary | ICD-10-CM | POA: Diagnosis not present

## 2023-10-29 DIAGNOSIS — Z30430 Encounter for insertion of intrauterine contraceptive device: Secondary | ICD-10-CM | POA: Diagnosis not present

## 2023-11-03 ENCOUNTER — Ambulatory Visit: Admitting: Surgery

## 2023-11-18 ENCOUNTER — Ambulatory Visit (INDEPENDENT_AMBULATORY_CARE_PROVIDER_SITE_OTHER): Admitting: Audiology

## 2023-11-18 ENCOUNTER — Other Ambulatory Visit (HOSPITAL_COMMUNITY): Payer: Self-pay

## 2023-11-18 ENCOUNTER — Encounter (INDEPENDENT_AMBULATORY_CARE_PROVIDER_SITE_OTHER): Payer: Self-pay | Admitting: Otolaryngology

## 2023-11-18 ENCOUNTER — Ambulatory Visit (INDEPENDENT_AMBULATORY_CARE_PROVIDER_SITE_OTHER): Admitting: Otolaryngology

## 2023-11-18 VITALS — BP 126/85 | HR 72 | Ht 67.0 in | Wt 234.0 lb

## 2023-11-18 DIAGNOSIS — H7322 Unspecified myringitis, left ear: Secondary | ICD-10-CM | POA: Diagnosis not present

## 2023-11-18 DIAGNOSIS — H9012 Conductive hearing loss, unilateral, left ear, with unrestricted hearing on the contralateral side: Secondary | ICD-10-CM

## 2023-11-18 DIAGNOSIS — Z9889 Other specified postprocedural states: Secondary | ICD-10-CM

## 2023-11-18 DIAGNOSIS — H6993 Unspecified Eustachian tube disorder, bilateral: Secondary | ICD-10-CM | POA: Diagnosis not present

## 2023-11-18 DIAGNOSIS — H938X2 Other specified disorders of left ear: Secondary | ICD-10-CM

## 2023-11-18 MED ORDER — FLUTICASONE PROPIONATE 50 MCG/ACT NA SUSP
2.0000 | Freq: Two times a day (BID) | NASAL | 6 refills | Status: DC
  Filled 2023-11-18: qty 16, 30d supply, fill #0

## 2023-11-18 NOTE — Progress Notes (Signed)
 Dear Dr. Bulah, Here is my assessment for our mutual patient, Laurie Reese. Thank you for allowing me the opportunity to care for your patient. Please do not hesitate to contact me should you have any other questions. Sincerely, Dr. Eldora Blanch  Otolaryngology Clinic Note Referring provider: Dr. Bulah HPI:  Laurie Reese is a 38 y.o. female kindly referred by Dr. Bulah for evaluation of left ear complaints.  Initial visit (05/2023): Patient reports: she reports chronic left ear trouble for several years including multiple infections per year. She reports that she has had ear infections as a child, and had tympanostomy tubes. She did well afterwards, and then started to have issues starting around 2011 when she had acute onset left ear pain and bleeding without antecedent event. She then saw Dr. Bryan, who reported that she had a TM perforation and repaired it 2012. After that, she reports that she did not have issues for a few years, and then started to have recurrent infections starting in 2019 (2-3/year) and then 4-5/year over past few years. Since pregnancy, she has had 3 exacerbations  She denies any nasal symptoms or infections precipitated by URI but her symptoms include left ear pressure, some postauricular pressure and feeling of fullness. Intermittent drainage/wetness. She does have to be careful with water because it seems to cause the infections. Never really has any pain. She frequently gets treated with antibiotics, and they do help though. Reports has never really tried oral steroids.   Last abx was in in Feb (early).  Sx restarted as above about 5 days ago.   Patient denies: vertigo, drainage, tinnitus Patient additionally denies: deep pain in ear canal, eustachian tube symptoms such as popping/crackling, sensitivity to pressure changes Patient also denies barotrauma, vestibular suppressant use, ototoxic medication use Prior ear surgery: Tympanoplasty (Dr. Bryan -  Laurie Reese); BTT  --------------------------------------------------------- 06/15/2023 Returns for follow up. Left ear doing much better after CSF powder; but now starting to have some trouble again with discomfort but not bad. No drainage currently, hearing change, vertigo.   --------------------------------------------------------- 08/24/2023 Returns for follow up. Ear doing well. No drainage or pain. Using CSF powder once every two weeks. No hearing change, no vertigo. --------------------------------------------------------- 11/18/2023 No exacerbations since last visit. No drainage or pain. Not using CSF powder currently. No hearing change or vertigo, audiogram was done today. She is doing relatively well after delivery.  H&N Surgery: see above Personal or FHx of bleeding dz or anesthesia difficulty: no  GLP-1: no AP/AC: no  PMHx: Healthy  Tobacco: no. Occupation: Human resources officer  Independent Review of Additional Tests or Records:  Dr. Bulah (04/20/2023) FM: Noted chronic ear infection issues; still with fullness; Dx: Chronic fullness, Rx: ref to ENT Laurie Reese ([facility]) 03/29/2023: noted left ear concerns, pressure but no pain; no URI; recent Omnicef ; prior tried cortisporin , amox and omnicef  with improvement; h/o rxn to ciprodex. Dx: left ear fullness, no infection Laurie Reese 08/11/2021: left ear pain, muffled hearing, drainage; got oral abx; Dx: cerumen and granulation at TM surface; Dx: granulation in ear? Rx: Cortisporin  Community education officer ([facility]) Audio 11/30/2022:   Labs: CMP 04/20/2023 and CBC: BUN/Cr 11/0.62; WBC 8.2, Plt 266  05/2023 Audiogram was independently reviewed and interpreted by me and it reveals - normal hearing AD with 100% WRT at 50dB with A tymp; AS with mild/mod CHL with B tymp, 100% WRT at 60dB   SNHL= Sensorineural hearing loss  11/2023 Audio reviewed: stable findings and thresholds compared to audio 05/2023  PMH/Meds/All/SocHx/FamHx/ROS:    Past Medical History:  Diagnosis Date   Gestational diabetes    History of postpartum hemorrhage    HPV vaccine counseling    Gardasil series completed ...    HSV-1 infection    Maternal anemia, with delivery    Migraine    Postpartum thyroiditis 06/07/2020   Reactive airway disease    rare flare up     Past Surgical History:  Procedure Laterality Date   MOUTH SURGERY     TYMPANOPLASTY     TYMPANOSTOMY TUBE PLACEMENT     WISDOM TOOTH EXTRACTION      Family History  Problem Relation Age of Onset   Hypertension Mother    Diabetes Mother    Diabetes Father    Crohn's disease Sister    Cancer Maternal Aunt        breast   Heart disease Maternal Grandmother    Cancer Maternal Grandfather        prostate   Diabetes Maternal Grandfather    Hypertension Maternal Grandfather    Heart disease Maternal Grandfather    Prostate cancer Maternal Grandfather    Colon cancer Neg Hx      Social Connections: Not on file      Current Outpatient Medications:    fluticasone  (FLONASE ) 50 MCG/ACT nasal spray, Place 2 sprays into both nostrils in the morning and at bedtime., Disp: 16 g, Rfl: 6   ibuprofen  (ADVIL ) 600 MG tablet, Take 1 tablet (600 mg total) by mouth every 6 (six) hours., Disp: 30 tablet, Rfl: 0   levonorgestrel  (MIRENA , 52 MG,) 20 MCG/DAY IUD, Take 1 device by intrauterine route., Disp: , Rfl:    Prenatal Vit-Fe Fumarate-FA (PRENATAL MULTIVITAMIN) TABS tablet, Take 1 tablet by mouth daily at 12 noon., Disp: , Rfl:    amoxicillin -clavulanate (AUGMENTIN ) 875-125 MG tablet, Take 1 tablet by mouth every 12 (twelve) hours for 10 days. (Patient not taking: Reported on 11/18/2023), Disp: 20 tablet, Rfl: 0   Physical Exam:   BP 126/85 (BP Location: Left Arm, Patient Position: Sitting, Cuff Size: Large)   Pulse 72   Ht 5' 7 (1.702 m)   Wt 234 lb (106.1 kg)   SpO2 96%   BMI 36.65 kg/m   Salient findings:  CN II-XII intact Given history and complaints, ear microscopy was  indicated and performed for evaluation with findings as below in physical exam section and in procedures  Right: EAC clear and TM intact with well pneumatized middle ear space; fair amount of pars flaccida retraction Left: no eczematoid change of canal; posterior wet myringitis with thickened ear drum (likely reconstruction); pars flaccida is clearly retracted and able to see handle of malleus with pars flaccida retraction but no obvious keratin debris; CSF powder placed on left Weber 512: left Rinne 512: AC > BC b/l  Seprately Identifiable Procedures:  Procedure: Bilateral ear microscopy using microscope (CPT [id number]) Pre-procedure diagnosis: left tympanoplasty and myringitis Post-procedure diagnosis: same Indication: see above; given patient's otologic complaints and history, for improved and comprehensive examination of external ear and tympanic membrane, bilateral otologic examination using microscope was performed. Prior to proceeding, verbal consent was obtained after discussion of R/B/A  Procedure: Patient was placed semi-recumbent. Both ear canals were examined using the microscope with findings above. Patient tolerated the procedure well.   Impression & Plans:  Genieve Ramaswamy is a 38 y.o. female with:  1. Dysfunction of both eustachian tubes   2. Conductive hearing loss of left ear with unrestricted hearing  of right ear   3. Myringitis of left ear   4. Sensation of fullness in left ear   5. History of tympanoplasty    Noted myringitis and pressure could be from ETD/mastoid effusion; Clear pars flaccida retraction bilaterally. This is stable but she is doing well, without any recent exacerbations. As such, given stability in hearing thresholds and symptomatically doing well, we discussed options -- observation, continued CSF powder for myringitis, v/s CT and possible exploration/tympanomastoidectomy  She opted for observation with CSF powder weekly or bimonthly Will start on flonase   two sprays 50mcg BID and autoinsufflate ears  F/u 6 months  Thank you for allowing me the opportunity to care for your patient. Please do not hesitate to contact me should you have any other questions.  Sincerely, Eldora Blanch, MD Otolaryngologist (ENT), [facility] Phone: [phone] Fax: [phone]  11/18/2023, 9:00 PM   MDM:  [phone] Complexity/Problems addressed: mod - multiple chronic problems Data complexity: low - Morbidity: mod - Prescription Drug prescribed or managed: y

## 2023-11-18 NOTE — Progress Notes (Signed)
  [address] [place], [place] [address] [phone]  Audiological Evaluation    Name: Laurie Reese     DOB:   [birth year]      MRN:   [id number]                                                                                     Service Date: 11/18/2023     Accompanied by: unaccompanied   Patient comes today after Dr. Tobie, ENT sent a referral for a hearing evaluation as a follow up after left conductive hearing loss. Patient reports that today feels better.   Symptoms Yes Details  Hearing loss  [x]  05-20-23:Right ear- Normal hearing from [phone] Hz.     Left ear-  Normal to moderate conductive hearing loss from [phone] Hz.      Tinnitus  []    Ear pain/ infections/pressure  []    Balance problems  []    Noise exposure history  []    Previous ear surgeries  [x]  Reports at least one set of PE tubes as a child and left tympanoplasty around 2011   Family history of hearing loss  []    Amplification  []    Other  []      Otoscopy: Right ear: Clear external ear canal and notable landmarks visualized on the tympanic membrane. Left ear:  Abnormal eardrum appearance.  Tympanometry: Right ear: Type Ad- Normal external ear canal volume with normal middle ear pressure and high tympanic membrane compliance. Left ear: Type B- Normal external ear canal volume with no middle ear pressure peak or tympanic membrane compliance.    Pure tone Audiometry: Right ear- Normal hearing from [phone] Hz.   Left ear-  Normal to mild  conductive hearing loss from 250 Hz - 8000 Hz.  Speech Audiometry: Right ear- Speech Reception Threshold (SRT) was obtained at 0 dBHL. Left ear-Speech Reception Threshold (SRT) was obtained at 15 dBHL.   Word Recognition Score Tested using NU-6 (recorded) Right ear: 100% was obtained at a presentation level of 50 dBHL with contralateral masking which is deemed as  excellent. Left ear: 100% was obtained at a presentation level of 60 dBHL with  contralateral masking which is deemed as  excellent.   The hearing test results were completed under headphones and results are deemed to be of good reliability. Test technique:  conventional    Impression: No significant changes noted in the left ear when compared to previous audiogram on file.   Recommendations: Follow up with ENT as scheduled for today. Follow up audiogram in conjunction with medical care.   Mykael Batz MARIE LEROUX-MARTINEZ, AUD

## 2023-11-26 ENCOUNTER — Other Ambulatory Visit (HOSPITAL_COMMUNITY): Payer: Self-pay

## 2023-11-26 DIAGNOSIS — Z30431 Encounter for routine checking of intrauterine contraceptive device: Secondary | ICD-10-CM | POA: Diagnosis not present

## 2023-12-13 ENCOUNTER — Other Ambulatory Visit (HOSPITAL_COMMUNITY): Payer: Self-pay

## 2023-12-13 MED ORDER — METOCLOPRAMIDE HCL 10 MG PO TABS
10.0000 mg | ORAL_TABLET | Freq: Three times a day (TID) | ORAL | 0 refills | Status: DC
  Filled 2023-12-13: qty 30, 10d supply, fill #0

## 2023-12-17 ENCOUNTER — Other Ambulatory Visit (HOSPITAL_COMMUNITY): Payer: Self-pay

## 2023-12-17 ENCOUNTER — Other Ambulatory Visit: Payer: Self-pay | Admitting: Medical

## 2023-12-17 ENCOUNTER — Telehealth: Payer: Self-pay | Admitting: Internal Medicine

## 2023-12-17 MED ORDER — UBRELVY 100 MG PO TABS
ORAL_TABLET | ORAL | 0 refills | Status: AC
  Filled 2023-12-17: qty 16, 30d supply, fill #0

## 2023-12-17 MED ORDER — TERBINAFINE HCL 1 % EX CREA
1.0000 | TOPICAL_CREAM | Freq: Two times a day (BID) | CUTANEOUS | 0 refills | Status: DC
  Filled 2023-12-17: qty 30, 15d supply, fill #0

## 2023-12-17 NOTE — Telephone Encounter (Signed)
 Pt also needed updated vaccine record. I have looked through her GYN notes to see if she got a latest TDAP shot at pregnancy. I've tried to call pt to double check as its looks like Dr. Barbette put one in on 09/21/23 but patient had baby at that time so not sure if that was the correct date she had it done.   Will try patient again later

## 2023-12-17 NOTE — Telephone Encounter (Signed)
 Patient would like to know if you could prescribe some nystatin  cream for her as she has a yeast rash under breast in the crease.   Also can you refill her migraine medicaton   Send to [facility]

## 2023-12-17 NOTE — Telephone Encounter (Signed)
 Reached out to patient to find out what migraine medication does she need refilled

## 2023-12-17 NOTE — Telephone Encounter (Signed)
 I sent in Ubrelvy  100mg  for patient as she can't remember what she takes as she is not at home to see what she has on hand.  Other medication in past history would be maxalt  10mg  if ubrelvy  wasn't it

## 2023-12-20 ENCOUNTER — Telehealth (HOSPITAL_COMMUNITY): Payer: Self-pay

## 2023-12-20 ENCOUNTER — Other Ambulatory Visit (HOSPITAL_COMMUNITY): Payer: Self-pay

## 2023-12-20 NOTE — Telephone Encounter (Signed)
 Pharmacy Patient Advocate Encounter   Received notification from Patient Pharmacy that prior authorization for Ubrelvy  100MG  tablets  is required/requested.   Insurance verification completed.   The patient is insured through [facility].   Per test claim: PA required; PA submitted to above mentioned insurance via Latent Key/confirmation #/EOC [id number] Status is pending

## 2023-12-20 NOTE — Telephone Encounter (Signed)
 Pharmacy Patient Advocate Encounter  Received notification from [facility] that Prior Authorization for  Ubrelvy  100MG  tablets  has been APPROVED from 12/20/23 to 06/17/24. Ran test claim, Copay is $0. This test claim was processed through [facility]- copay amounts may vary at other pharmacies due to pharmacy/plan contracts, or as the patient moves through the different stages of their insurance plan.   PA #/Case ID/Reference #: [id number]

## 2023-12-23 ENCOUNTER — Other Ambulatory Visit: Payer: Self-pay

## 2023-12-23 ENCOUNTER — Other Ambulatory Visit: Payer: Self-pay | Admitting: Medical

## 2023-12-23 ENCOUNTER — Other Ambulatory Visit (HOSPITAL_COMMUNITY): Payer: Self-pay

## 2023-12-23 MED ORDER — NYSTATIN 100000 UNIT/GM EX CREA
1.0000 | TOPICAL_CREAM | Freq: Two times a day (BID) | CUTANEOUS | 0 refills | Status: DC
  Filled 2023-12-23: qty 30, 15d supply, fill #0

## 2023-12-23 MED ORDER — NYSTATIN 100000 UNIT/GM EX POWD
1.0000 | Freq: Three times a day (TID) | CUTANEOUS | 1 refills | Status: AC
  Filled 2023-12-23: qty 60, 20d supply, fill #0

## 2023-12-23 MED ORDER — NYSTATIN 100000 UNIT/GM EX POWD
1.0000 | Freq: Three times a day (TID) | CUTANEOUS | 0 refills | Status: DC
  Filled 2023-12-23 – 2024-03-13 (×3): qty 15, 5d supply, fill #0

## 2023-12-23 NOTE — Telephone Encounter (Signed)
 Lamisil  is not working and says the area has a odor to it and smells yeasty. Can she get nystatin  cream

## 2023-12-23 NOTE — Addendum Note (Signed)
 Addended by: VICCI HUSBAND A on: 12/23/2023 02:19 PM   Modules accepted: Orders

## 2023-12-30 ENCOUNTER — Telehealth: Admitting: Physician Assistant

## 2023-12-30 ENCOUNTER — Other Ambulatory Visit (HOSPITAL_COMMUNITY): Payer: Self-pay

## 2023-12-30 DIAGNOSIS — B9689 Other specified bacterial agents as the cause of diseases classified elsewhere: Secondary | ICD-10-CM

## 2023-12-30 DIAGNOSIS — J019 Acute sinusitis, unspecified: Secondary | ICD-10-CM

## 2023-12-30 DIAGNOSIS — H60392 Other infective otitis externa, left ear: Secondary | ICD-10-CM

## 2023-12-30 MED ORDER — AMOXICILLIN-POT CLAVULANATE 875-125 MG PO TABS
1.0000 | ORAL_TABLET | Freq: Two times a day (BID) | ORAL | 0 refills | Status: DC
  Filled 2023-12-30: qty 14, 7d supply, fill #0

## 2023-12-30 MED ORDER — HYDROCORTISONE-ACETIC ACID 1-2 % OT SOLN
5.0000 [drp] | Freq: Three times a day (TID) | OTIC | 0 refills | Status: DC
  Filled 2023-12-30: qty 10, 14d supply, fill #0

## 2023-12-30 NOTE — Progress Notes (Signed)
 E-Visit for Sinus Problems  We are sorry that you are not feeling well.  Here is how we plan to help!  Based on what you have shared with me it looks like you have sinusitis and an external ear infection.   Sinusitis is inflammation and infection in the sinus cavities of the head.  Based on your presentation I believe you most likely have Acute Bacterial Sinusitis.  This is an infection caused by bacteria and is treated with antibiotics. I have prescribed Augmentin  875mg /125mg  one tablet twice daily with food, for 7 days. You may use an oral decongestant such as Mucinex D or if you have glaucoma or high blood pressure use plain Mucinex. Saline nasal spray help and can safely be used as often as needed for congestion.  If you develop worsening sinus pain, fever or notice severe headache and vision changes, or if symptoms are not better after completion of antibiotic, please schedule an appointment with a health care provider.    Sinus infections are not as easily transmitted as other respiratory infection, however we still recommend that you avoid close contact with loved ones, especially the very young and elderly.  Remember to wash your hands thoroughly throughout the day as this is the number one way to prevent the spread of infection!  For the ear, I have also prescribed Vosol-HC drops to apply as directed.  Home Care: Only take medications as instructed by your medical team. Complete the entire course of an antibiotic. Do not take these medications with alcohol. A steam or ultrasonic humidifier can help congestion.  You can place a towel over your head and breathe in the steam from hot water coming from a faucet. Avoid close contacts especially the very young and the elderly. Cover your mouth when you cough or sneeze. Always remember to wash your hands.  Get Help Right Away If: You develop worsening fever or sinus pain. You develop a severe head ache or visual changes. Your symptoms  persist after you have completed your treatment plan.  Make sure you Understand these instructions. Will watch your condition. Will get help right away if you are not doing well or get worse.  Your e-visit answers were reviewed by a board certified advanced clinical practitioner to complete your personal care plan.  Depending on the condition, your plan could have included both over the counter or prescription medications.  If there is a problem please reply  once you have received a response from your provider.  Your safety is important to us .  If you have drug allergies check your prescription carefully.    You can use MyChart to ask questions about today's visit, request a non-urgent call back, or ask for a work or school excuse for 24 hours related to this e-Visit. If it has been greater than 24 hours you will need to follow up with your provider, or enter a new e-Visit to address those concerns.  You will get an e-mail in the next two days asking about your experience.  I hope that your e-visit has been valuable and will speed your recovery. Thank you for using e-visits.  I have spent 5 minutes in review of e-visit questionnaire, review and updating patient chart, medical decision making and response to patient.   Elsie Velma Lunger, PA-C

## 2024-02-29 ENCOUNTER — Telehealth: Admitting: Physician Assistant

## 2024-02-29 ENCOUNTER — Other Ambulatory Visit (HOSPITAL_COMMUNITY): Payer: Self-pay

## 2024-02-29 DIAGNOSIS — J019 Acute sinusitis, unspecified: Secondary | ICD-10-CM | POA: Diagnosis not present

## 2024-02-29 DIAGNOSIS — B9689 Other specified bacterial agents as the cause of diseases classified elsewhere: Secondary | ICD-10-CM

## 2024-02-29 MED ORDER — AMOXICILLIN-POT CLAVULANATE 875-125 MG PO TABS
1.0000 | ORAL_TABLET | Freq: Two times a day (BID) | ORAL | 0 refills | Status: DC
  Filled 2024-02-29: qty 14, 7d supply, fill #0

## 2024-02-29 NOTE — Progress Notes (Signed)

## 2024-03-01 ENCOUNTER — Telehealth: Payer: Self-pay | Admitting: Internal Medicine

## 2024-03-01 ENCOUNTER — Other Ambulatory Visit (HOSPITAL_COMMUNITY): Payer: Self-pay

## 2024-03-01 MED ORDER — OSELTAMIVIR PHOSPHATE 75 MG PO CAPS
75.0000 mg | ORAL_CAPSULE | Freq: Two times a day (BID) | ORAL | 0 refills | Status: DC
  Filled 2024-03-01: qty 10, 5d supply, fill #0

## 2024-03-01 NOTE — Telephone Encounter (Signed)
 Pt's daughter was just positive yesterday for Flu. Pt would like tamiflu  sent in as preventative. Spoke to Dr. Vita and he is ok with me sending in medication

## 2024-03-13 ENCOUNTER — Other Ambulatory Visit: Payer: Self-pay

## 2024-03-13 ENCOUNTER — Other Ambulatory Visit (HOSPITAL_COMMUNITY): Payer: Self-pay

## 2024-03-15 ENCOUNTER — Other Ambulatory Visit (HOSPITAL_COMMUNITY): Payer: Self-pay

## 2024-03-16 ENCOUNTER — Other Ambulatory Visit (HOSPITAL_COMMUNITY): Payer: Self-pay

## 2024-03-16 ENCOUNTER — Encounter (INDEPENDENT_AMBULATORY_CARE_PROVIDER_SITE_OTHER): Payer: Self-pay | Admitting: Otolaryngology

## 2024-03-16 ENCOUNTER — Ambulatory Visit (INDEPENDENT_AMBULATORY_CARE_PROVIDER_SITE_OTHER): Admitting: Otolaryngology

## 2024-03-16 VITALS — BP 129/84 | HR 72 | Temp 97.4°F

## 2024-03-16 DIAGNOSIS — H9012 Conductive hearing loss, unilateral, left ear, with unrestricted hearing on the contralateral side: Secondary | ICD-10-CM

## 2024-03-16 DIAGNOSIS — H6122 Impacted cerumen, left ear: Secondary | ICD-10-CM | POA: Diagnosis not present

## 2024-03-16 DIAGNOSIS — H7322 Unspecified myringitis, left ear: Secondary | ICD-10-CM | POA: Diagnosis not present

## 2024-03-16 DIAGNOSIS — H6993 Unspecified Eustachian tube disorder, bilateral: Secondary | ICD-10-CM

## 2024-03-16 DIAGNOSIS — H938X2 Other specified disorders of left ear: Secondary | ICD-10-CM

## 2024-03-16 DIAGNOSIS — Z9889 Other specified postprocedural states: Secondary | ICD-10-CM

## 2024-03-16 MED ORDER — NEOMYCIN-POLYMYXIN-HC 3.5-10000-1 OT SOLN
4.0000 [drp] | Freq: Two times a day (BID) | OTIC | 1 refills | Status: AC
  Filled 2024-03-16: qty 10, 25d supply, fill #0

## 2024-03-16 NOTE — Progress Notes (Signed)
 Dear Dr. Bulah, Here is my assessment for our mutual patient, Laurie Reese. Thank you for allowing me the opportunity to care for your patient. Please do not hesitate to contact me should you have any other questions. Sincerely, Dr. Eldora Blanch  Otolaryngology Clinic Note Referring provider: Dr. Bulah HPI:  Laurie Reese is a 39 y.o. female kindly referred by Dr. Bulah for evaluation of left ear complaints.  Initial visit (05/2023): Patient reports: she reports chronic left ear trouble for several years including multiple infections per year. She reports that she has had ear infections as a child, and had tympanostomy tubes. She did well afterwards, and then started to have issues starting around 2011 when she had acute onset left ear pain and bleeding without antecedent event. She then saw Dr. Bryan, who reported that she had a TM perforation and repaired it 2012. After that, she reports that she did not have issues for a few years, and then started to have recurrent infections starting in 2019 (2-3/year) and then 4-5/year over past few years. Since pregnancy, she has had 3 exacerbations  She denies any nasal symptoms or infections precipitated by URI but her symptoms include left ear pressure, some postauricular pressure and feeling of fullness. Intermittent drainage/wetness. She does have to be careful with water because it seems to cause the infections. Never really has any pain. She frequently gets treated with antibiotics, and they do help though. Reports has never really tried oral steroids.   Last abx was in in Feb (early).  Sx restarted as above about 5 days ago.   Patient denies: vertigo, drainage, tinnitus Patient additionally denies: deep pain in ear canal, eustachian tube symptoms such as popping/crackling, sensitivity to pressure changes Patient also denies barotrauma, vestibular suppressant use, ototoxic medication use Prior ear surgery: Tympanoplasty (Dr. Bryan -  [facility]); BTT  --------------------------------------------------------- 06/15/2023 Returns for follow up. Left ear doing much better after CSF powder; but now starting to have some trouble again with discomfort but not bad. No drainage currently, hearing change, vertigo.   --------------------------------------------------------- 08/24/2023 Returns for follow up. Ear doing well. No drainage or pain. Using CSF powder once every two weeks. No hearing change, no vertigo. --------------------------------------------------------- 11/18/2023 No exacerbations since last visit. No drainage or pain. Not using CSF powder currently. No hearing change or vertigo, audiogram was done today. She is doing relatively well after delivery. --------------------------------------------------------- 03/16/2024 No exacerbations but does have very plugged feeling left ear. Not using CSF currently. Otherwise doing fairly well  H&N Surgery: see above Personal or FHx of bleeding dz or anesthesia difficulty: no  GLP-1: no AP/AC: no  PMHx: Healthy  Tobacco: no. Occupation: Human Resources Officer  Independent Review of Additional Tests or Records:  Dr. Bulah (04/20/2023) FM: Noted chronic ear infection issues; still with fullness; Dx: Chronic fullness, Rx: ref to ENT Laurie Reese ([facility]) 03/29/2023: noted left ear concerns, pressure but no pain; no URI; recent Omnicef ; prior tried cortisporin , amox and omnicef  with improvement; h/o rxn to ciprodex. Dx: left ear fullness, no infection Laurie Reese 08/11/2021: left ear pain, muffled hearing, drainage; got oral abx; Dx: cerumen and granulation at TM surface; Dx: granulation in ear? Rx: Cortisporin  Community Education Officer ([facility]) Audio 11/30/2022:   Labs: CMP 04/20/2023 and CBC: BUN/Cr 11/0.62; WBC 8.2, Plt 266  05/2023 Audiogram was independently reviewed and interpreted by me and it reveals - normal hearing AD with 100% WRT at 50dB with A tymp; AS with mild/mod  CHL with B tymp, 100% WRT  at 60dB   SNHL= Sensorineural hearing loss  11/2023 Audio reviewed: stable findings and thresholds compared to audio 05/2023  PMH/Meds/All/SocHx/FamHx/ROS:   Past Medical History:  Diagnosis Date   Gestational diabetes    History of postpartum hemorrhage    HPV vaccine counseling    Gardasil series completed ...    HSV-1 infection    Maternal anemia, with delivery    Migraine    Postpartum thyroiditis 06/07/2020   Reactive airway disease    rare flare up     Past Surgical History:  Procedure Laterality Date   MOUTH SURGERY     TYMPANOPLASTY     TYMPANOSTOMY TUBE PLACEMENT     WISDOM TOOTH EXTRACTION      Family History  Problem Relation Age of Onset   Hypertension Mother    Diabetes Mother    Diabetes Father    Crohn's disease Sister    Cancer Maternal Aunt        breast   Heart disease Maternal Grandmother    Cancer Maternal Grandfather        prostate   Diabetes Maternal Grandfather    Hypertension Maternal Grandfather    Heart disease Maternal Grandfather    Prostate cancer Maternal Grandfather    Colon cancer Neg Hx      Social Connections: Not on file      Current Outpatient Medications:    neomycin -polymyxin-hydrocortisone  (CORTISPORIN ) OTIC solution, Place 4 drops into the left ear in the morning and at bedtime for 7 days., Disp: 10 mL, Rfl: 1   nystatin  (MYCOSTATIN /NYSTOP ) powder, Apply topically 3 (three) times daily., Disp: 15 g, Rfl: 0   acetic acid -hydrocortisone  (VOSOL -HC) OTIC solution, Place 5 drops into the left ear 3 (three) times daily., Disp: 10 mL, Rfl: 0   amoxicillin -clavulanate (AUGMENTIN ) 875-125 MG tablet, Take 1 tablet by mouth 2 (two) times daily., Disp: 14 tablet, Rfl: 0   fluticasone  (FLONASE ) 50 MCG/ACT nasal spray, Place 2 sprays into both nostrils in the morning and at bedtime., Disp: 16 g, Rfl: 6   levonorgestrel  (MIRENA , 52 MG,) 20 MCG/DAY IUD, Take 1 device by intrauterine route., Disp: , Rfl:     metoCLOPramide  (REGLAN ) 10 MG tablet, Take 1 tablet (10 mg total) by mouth 3 (three) times daily for 10 days, Disp: 30 tablet, Rfl: 0   nystatin  (MYCOSTATIN /NYSTOP ) powder, Apply topically 3 (three) times daily as directed., Disp: 60 g, Rfl: 1   nystatin  cream (MYCOSTATIN ), Apply topically 2 (two) times daily., Disp: 30 g, Rfl: 0   oseltamivir  (TAMIFLU ) 75 MG capsule, Take 1 capsule (75 mg total) by mouth 2 (two) times daily., Disp: 10 capsule, Rfl: 0   Prenatal Vit-Fe Fumarate-FA (PRENATAL MULTIVITAMIN) TABS tablet, Take 1 tablet by mouth daily at 12 noon., Disp: , Rfl:    Ubrogepant  (UBRELVY ) 100 MG TABS, Take 1 tablet daily as needed for headache. Can repeat in 2 hours if needed, Disp: 16 tablet, Rfl: 0   Physical Exam:   BP 129/84 Comment: 137/91 1st attempt  Pulse 72   Temp (!) 97.4 F (36.3 C)   SpO2 95%   Salient findings:  CN II-XII intact Given history and complaints, ear microscopy was indicated and performed for evaluation with findings as below in physical exam section and in procedures  Right: EAC clear and TM intact with well pneumatized middle ear space; fair amount of pars flaccida retraction Left: no eczematoid change of canal; noted cerumen impaction left ear; after clearance, patient feels better; some granulation  inferiorly/myringitis with pars flaccida is clearly retracted and able to see handle of malleus with pars flaccida retraction but no obvious keratin debris; ciprodex placed left Weber 512: left Rinne 512: AC > BC b/l  Seprately Identifiable Procedures:  Prior to proceeding, R/B/A was discussed for any procedures and verbal consent obtained Procedure: Bilateral ear microscopy and cerumen removal using microscope (CPT [phone]) - Mod 25 Pre-procedure diagnosis: Cerumen impaction left external ears Post-procedure diagnosis: same Indication: Left cerumen impaction; given patient's otologic complaints and history as well as for improved and comprehensive examination of  external ear and tympanic membrane, bilateral otologic examination using microscope was performed and impacted cerumen removed  Procedure: Patient was placed semi-recumbent. Both ear canals were examined using the microscope with findings above. Impacted Cerumen removed on left straight pick and currette with improvement in EAC examination and patency. Patient tolerated the procedure well.       Impression & Plans:  Solae Norling is a 39 y.o. female with:  1. Conductive hearing loss of left ear with unrestricted hearing of right ear   2. Dysfunction of both eustachian tubes   3. Myringitis of left ear   4. Sensation of fullness in left ear   5. History of tympanoplasty   6. Impacted cerumen of left ear    Noted myringitis and pressure could be from ETD/mastoid effusion; Clear pars flaccida retraction bilaterally. This is stable but she is doing well, without any recent exacerbations except for left cerumen impaction today. As such, given stability in hearing thresholds and symptomatically doing well, we discussed options -- observation, continued CSF powder for myringitis v/s cortisporin , v/s CT and possible exploration/tympanomastoidectomy  She opted for observation with CSF powder bimonthly Will continue flonase  two sprays 50mcg BID and autoinsufflate ears Given recent impaction, encouraged cortisporin  for 1 week BID  F/u 6 months  Thank you for allowing me the opportunity to care for your patient. Please do not hesitate to contact me should you have any other questions.  Sincerely, Eldora Blanch, MD Otolaryngologist (ENT), [facility] Phone: [phone] Fax: [phone]  03/16/2024, 9:03 AM   MDM:  [phone] Complexity/Problems addressed: mod - multiple chronic problems, one with exacerbation Data complexity: low - Morbidity: mod - Prescription Drug prescribed or managed: y

## 2024-03-24 ENCOUNTER — Other Ambulatory Visit (HOSPITAL_COMMUNITY): Payer: Self-pay

## 2024-03-24 ENCOUNTER — Ambulatory Visit: Admitting: Medical

## 2024-03-24 VITALS — BP 112/72 | HR 87 | Ht 67.0 in | Wt 232.2 lb

## 2024-03-24 DIAGNOSIS — Z6836 Body mass index (BMI) 36.0-36.9, adult: Secondary | ICD-10-CM | POA: Diagnosis not present

## 2024-03-24 DIAGNOSIS — Z8632 Personal history of gestational diabetes: Secondary | ICD-10-CM

## 2024-03-24 DIAGNOSIS — R635 Abnormal weight gain: Secondary | ICD-10-CM | POA: Diagnosis not present

## 2024-03-24 DIAGNOSIS — Z872 Personal history of diseases of the skin and subcutaneous tissue: Secondary | ICD-10-CM | POA: Diagnosis not present

## 2024-03-24 LAB — POCT GLYCOSYLATED HEMOGLOBIN (HGB A1C): Hemoglobin A1C: 5.3 % (ref 4.0–5.6)

## 2024-03-24 NOTE — Progress Notes (Signed)
 "  Name: Laurie Reese   Date of Visit: 03/24/24   Date of last visit with me: Visit date not found   CHIEF COMPLAINT:  Chief Complaint  Patient presents with   Consult    Discuss weight       HPI:  Discussed the use of AI scribe software for clinical note transcription with the patient, who gave verbal consent to proceed.  History of Present Illness   Laurie Reese is a 39 year old female who presents with concerns about weight management.  She has previously been on Wegovy  over a year ago,  which she reports initially curbed her appetite, but she did not feel it resulted in much weight loss. She also had significant GI upset with wegovy .   She is exploring options for weight loss medications under her new insurance plan, which may require prior authorization. Her BMI is 36.  She has been approved for Zepbound  in the past. She is considering other medications such as phentermine  and Topiramate  She has not used Ubrelvy  for headaches in a long time and has previously tolerated phentermine  well before the combination medications were available. She is considering using phentermine  again but is aware of the limitations and potential side effects.  She is not currently breastfeeding and has a six-month-old child. She has not been diagnosed with diabetes, and her last A1c was normal two years ago. Her triglycerides were elevated a year ago, but her thyroid  levels have been normal.   ROS as in subjective  Past Medical History:  Diagnosis Date   Gestational diabetes    History of postpartum hemorrhage    HPV vaccine counseling    Gardasil series completed ...    HSV-1 infection    Maternal anemia, with delivery    Migraine    Postpartum thyroiditis 06/07/2020   Reactive airway disease    rare flare up    Medications Ordered Prior to Encounter[1]     OBJECTIVE:    BP 112/72   Pulse 87   Ht 5' 7 (1.702 m)   Wt 232 lb 3.2 oz (105.3 kg)   BMI 36.37 kg/m   BP  Readings from Last 3 Encounters:  03/24/24 112/72  03/16/24 129/84  11/18/23 126/85   Wt Readings from Last 3 Encounters:  03/24/24 232 lb 3.2 oz (105.3 kg)  11/18/23 234 lb (106.1 kg)  09/20/23 261 lb 14.4 oz (118.8 kg)   General appearence: alert, no distress, WD/WN,  HEENT: normocephalic, sclerae anicteric, TMs pearly    ASSESSMENT/PLAN:   Encounter Diagnoses  Name Primary?   Weight gain Yes   BMI 36.0-36.9,adult    History of gestational diabetes    History of hidradenitis suppurativa     Obesity, weight gain, hx/o hidradenitis BMI 36. Previous Wegovy  trial ineffective. Discussed weight loss study at [facility].  Consider phentermine , Qsymia, Contrave with side effects and coverage. - Referred to [facility] for weight loss study. - If study not feasible, attempt Zepbound  with prior authorization. - If Zepbound  not covered, consider short-term phentermine  with side effect monitoring. - Encouraged regular exercise and dietary changes. -counseled on diet, exercise    Heer was seen today for consult.  Diagnoses and all orders for this visit:  Weight gain -     HgB A1c  BMI 36.0-36.9,adult -     HgB A1c  History of gestational diabetes  History of hidradenitis suppurativa -     HgB A1c    F/u pending call back  [facility]     [1]  Current Outpatient Medications on File Prior to Visit  Medication Sig Dispense Refill   levonorgestrel  (MIRENA , 52 MG,) 20 MCG/DAY IUD Take 1 device by intrauterine route.     neomycin -polymyxin-hydrocortisone  (CORTISPORIN ) OTIC solution Place 4 drops into the left ear in the morning and at bedtime for 7 days. 10 mL 1   Ubrogepant  (UBRELVY ) 100 MG TABS Take 1 tablet daily as needed for headache. Can repeat in 2 hours if needed 16 tablet 0   nystatin  (MYCOSTATIN /NYSTOP ) powder Apply topically 3 (three) times daily as directed. 60 g 1   No current facility-administered medications on  file prior to visit.   "

## 2024-03-31 ENCOUNTER — Other Ambulatory Visit (HOSPITAL_COMMUNITY): Payer: Self-pay

## 2024-03-31 MED ORDER — MEDROXYPROGESTERONE ACETATE 5 MG PO TABS
5.0000 mg | ORAL_TABLET | Freq: Every day | ORAL | 0 refills | Status: AC
  Filled 2024-03-31: qty 15, 15d supply, fill #0

## 2024-04-21 ENCOUNTER — Encounter: Admitting: Medical

## 2024-04-27 ENCOUNTER — Ambulatory Visit (INDEPENDENT_AMBULATORY_CARE_PROVIDER_SITE_OTHER): Admitting: Otolaryngology
